# Patient Record
Sex: Male | Born: 1950
Health system: Southern US, Community
[De-identification: ages and names within clinical notes are randomized; demographics above are authoritative.]

## PROBLEM LIST (undated history)

## (undated) DIAGNOSIS — R7303 Prediabetes: Secondary | ICD-10-CM

## (undated) DIAGNOSIS — I350 Nonrheumatic aortic (valve) stenosis: Secondary | ICD-10-CM

## (undated) DIAGNOSIS — I499 Cardiac arrhythmia, unspecified: Secondary | ICD-10-CM

## (undated) DIAGNOSIS — M5430 Sciatica, unspecified side: Secondary | ICD-10-CM

## (undated) DIAGNOSIS — M1990 Unspecified osteoarthritis, unspecified site: Secondary | ICD-10-CM

## (undated) DIAGNOSIS — E041 Nontoxic single thyroid nodule: Secondary | ICD-10-CM

## (undated) DIAGNOSIS — E785 Hyperlipidemia, unspecified: Secondary | ICD-10-CM

## (undated) DIAGNOSIS — R011 Cardiac murmur, unspecified: Secondary | ICD-10-CM

## (undated) DIAGNOSIS — E119 Type 2 diabetes mellitus without complications: Secondary | ICD-10-CM

## (undated) DIAGNOSIS — K219 Gastro-esophageal reflux disease without esophagitis: Secondary | ICD-10-CM

## (undated) DIAGNOSIS — Z8719 Personal history of other diseases of the digestive system: Secondary | ICD-10-CM

## (undated) DIAGNOSIS — I2109 ST elevation (STEMI) myocardial infarction involving other coronary artery of anterior wall: Secondary | ICD-10-CM

## (undated) DIAGNOSIS — F419 Anxiety disorder, unspecified: Secondary | ICD-10-CM

## (undated) DIAGNOSIS — I2510 Atherosclerotic heart disease of native coronary artery without angina pectoris: Secondary | ICD-10-CM

## (undated) HISTORY — DX: Hyperlipidemia, unspecified: E78.5

## (undated) HISTORY — PX: CARDIAC CATHETERIZATION: SHX172

## (undated) HISTORY — PX: VASECTOMY: SHX75

## (undated) HISTORY — DX: ST elevation (STEMI) myocardial infarction involving other coronary artery of anterior wall: I21.09

## (undated) HISTORY — PX: BIOPSY THYROID: PRO38

## (undated) HISTORY — DX: Anxiety disorder, unspecified: F41.9

## (undated) HISTORY — DX: Atherosclerotic heart disease of native coronary artery without angina pectoris: I25.10

## (undated) HISTORY — PX: MANDIBLE OSTEOTOMY: SHX1007

---

## 2004-11-26 ENCOUNTER — Ambulatory Visit: Payer: Self-pay | Admitting: Internal Medicine

## 2004-11-26 ENCOUNTER — Ambulatory Visit (HOSPITAL_COMMUNITY): Admission: RE | Admit: 2004-11-26 | Discharge: 2004-11-26 | Payer: Self-pay | Admitting: Internal Medicine

## 2005-06-13 ENCOUNTER — Encounter: Payer: Self-pay | Admitting: Cardiology

## 2012-03-17 DIAGNOSIS — I499 Cardiac arrhythmia, unspecified: Secondary | ICD-10-CM

## 2012-03-17 HISTORY — PX: CORONARY ANGIOPLASTY WITH STENT PLACEMENT: SHX49

## 2012-03-17 HISTORY — DX: Cardiac arrhythmia, unspecified: I49.9

## 2012-04-22 ENCOUNTER — Inpatient Hospital Stay (HOSPITAL_COMMUNITY)
Admission: EM | Admit: 2012-04-22 | Discharge: 2012-04-26 | DRG: 247 | Disposition: A | Discharge status: Home / Self Care | Payer: 59 | Attending: Cardiology | Admitting: Cardiology

## 2012-04-22 ENCOUNTER — Encounter (HOSPITAL_COMMUNITY)
Admission: EM | Disposition: A | Discharge status: Home / Self Care | Payer: Self-pay | Source: Home / Self Care | Attending: Cardiology

## 2012-04-22 ENCOUNTER — Encounter (HOSPITAL_COMMUNITY): Payer: Self-pay | Admitting: Physician Assistant

## 2012-04-22 ENCOUNTER — Inpatient Hospital Stay (HOSPITAL_COMMUNITY): Payer: 59

## 2012-04-22 DIAGNOSIS — I10 Essential (primary) hypertension: Secondary | ICD-10-CM | POA: Diagnosis present

## 2012-04-22 DIAGNOSIS — I2510 Atherosclerotic heart disease of native coronary artery without angina pectoris: Secondary | ICD-10-CM

## 2012-04-22 DIAGNOSIS — E876 Hypokalemia: Secondary | ICD-10-CM | POA: Diagnosis not present

## 2012-04-22 DIAGNOSIS — I2109 ST elevation (STEMI) myocardial infarction involving other coronary artery of anterior wall: Secondary | ICD-10-CM

## 2012-04-22 DIAGNOSIS — K219 Gastro-esophageal reflux disease without esophagitis: Secondary | ICD-10-CM | POA: Diagnosis present

## 2012-04-22 DIAGNOSIS — R7303 Prediabetes: Secondary | ICD-10-CM | POA: Diagnosis present

## 2012-04-22 DIAGNOSIS — R7309 Other abnormal glucose: Secondary | ICD-10-CM | POA: Diagnosis present

## 2012-04-22 DIAGNOSIS — Z955 Presence of coronary angioplasty implant and graft: Secondary | ICD-10-CM

## 2012-04-22 HISTORY — PX: LEFT HEART CATHETERIZATION WITH CORONARY ANGIOGRAM: SHX5451

## 2012-04-22 HISTORY — DX: Prediabetes: R73.03

## 2012-04-22 HISTORY — DX: Gastro-esophageal reflux disease without esophagitis: K21.9

## 2012-04-22 HISTORY — DX: Nontoxic single thyroid nodule: E04.1

## 2012-04-22 LAB — POCT I-STAT, CHEM 8
BUN: 7 mg/dL (ref 6–23)
Creatinine, Ser: 0.7 mg/dL (ref 0.50–1.35)
Hemoglobin: 14.6 g/dL (ref 13.0–17.0)
Potassium: 2.5 mEq/L — CL (ref 3.5–5.1)
Sodium: 133 mEq/L — ABNORMAL LOW (ref 135–145)

## 2012-04-22 LAB — CBC
Hemoglobin: 14.1 g/dL (ref 13.0–17.0)
MCH: 32.3 pg (ref 26.0–34.0)
MCHC: 35.8 g/dL (ref 30.0–36.0)
Platelets: 171 10*3/uL (ref 150–400)
RBC: 4.37 MIL/uL (ref 4.22–5.81)

## 2012-04-22 LAB — COMPREHENSIVE METABOLIC PANEL
ALT: 75 U/L — ABNORMAL HIGH (ref 0–53)
AST: 51 U/L — ABNORMAL HIGH (ref 0–37)
Alkaline Phosphatase: 58 U/L (ref 39–117)
Calcium: 7.4 mg/dL — ABNORMAL LOW (ref 8.4–10.5)
Glucose, Bld: 182 mg/dL — ABNORMAL HIGH (ref 70–99)
Potassium: 2.4 mEq/L — CL (ref 3.5–5.1)
Sodium: 128 mEq/L — ABNORMAL LOW (ref 135–145)
Total Protein: 5.6 g/dL — ABNORMAL LOW (ref 6.0–8.3)

## 2012-04-22 LAB — LIPID PANEL
Cholesterol: 141 mg/dL (ref 0–200)
HDL: 53 mg/dL (ref >39)
LDL Cholesterol: 72 mg/dL (ref 0–99)
Total CHOL/HDL Ratio: 2.7 RATIO
Triglycerides: 82 mg/dL (ref <150)
VLDL: 16 mg/dL (ref 0–40)

## 2012-04-22 LAB — CK TOTAL AND CKMB (NOT AT ARMC): Total CK: 119 U/L (ref 7–232)

## 2012-04-22 LAB — TROPONIN I: Troponin I: 0.61 ng/mL (ref <0.30)

## 2012-04-22 SURGERY — LEFT HEART CATHETERIZATION WITH CORONARY ANGIOGRAM
Anesthesia: LOCAL

## 2012-04-22 MED ORDER — ATROPINE SULFATE 1 MG/ML IJ SOLN
INTRAMUSCULAR | Status: AC
  Filled 2012-04-22: qty 1

## 2012-04-22 MED ORDER — ASPIRIN 81 MG PO CHEW
81.0000 mg | CHEWABLE_TABLET | Freq: Every day | ORAL | Status: DC
  Administered 2012-04-23 – 2012-04-26 (×4): 81 mg via ORAL
  Filled 2012-04-22 (×4): qty 1

## 2012-04-22 MED ORDER — LIDOCAINE HCL (PF) 1 % IJ SOLN
INTRAMUSCULAR | Status: AC
  Filled 2012-04-22: qty 30

## 2012-04-22 MED ORDER — ATORVASTATIN CALCIUM 80 MG PO TABS: 80.0000 mg | ORAL_TABLET | Freq: Every day | ORAL | Status: DC

## 2012-04-22 MED ORDER — ONDANSETRON HCL 4 MG/2ML IJ SOLN
4.0000 mg | Freq: Four times a day (QID) | INTRAMUSCULAR | Status: DC | PRN
  Administered 2012-04-22: 4 mg via INTRAVENOUS
  Filled 2012-04-22: qty 2

## 2012-04-22 MED ORDER — ATORVASTATIN CALCIUM 80 MG PO TABS
80.0000 mg | ORAL_TABLET | Freq: Every day | ORAL | Status: DC
  Administered 2012-04-22 – 2012-04-25 (×3): 80 mg via ORAL
  Filled 2012-04-22 (×5): qty 1

## 2012-04-22 MED ORDER — FENTANYL CITRATE 0.05 MG/ML IJ SOLN
INTRAMUSCULAR | Status: AC
  Administered 2012-04-23: 12.5 ug via INTRAVENOUS
  Filled 2012-04-22: qty 2

## 2012-04-22 MED ORDER — ACETAMINOPHEN 325 MG PO TABS
650.0000 mg | ORAL_TABLET | ORAL | Status: DC | PRN
  Administered 2012-04-23: 650 mg via ORAL
  Filled 2012-04-22: qty 2

## 2012-04-22 MED ORDER — SODIUM CHLORIDE 0.9 % IJ SOLN: 3.0000 mL | INTRAMUSCULAR | Status: DC | PRN

## 2012-04-22 MED ORDER — TICAGRELOR 90 MG PO TABS
90.0000 mg | ORAL_TABLET | Freq: Two times a day (BID) | ORAL | Status: DC
  Administered 2012-04-22 – 2012-04-26 (×8): 90 mg via ORAL
  Filled 2012-04-22 (×9): qty 1

## 2012-04-22 MED ORDER — MORPHINE SULFATE 2 MG/ML IJ SOLN
INTRAMUSCULAR | Status: AC
  Administered 2012-04-22: 2 mg via INTRAVENOUS
  Filled 2012-04-22: qty 1

## 2012-04-22 MED ORDER — MORPHINE SULFATE 2 MG/ML IJ SOLN
2.0000 mg | INTRAMUSCULAR | Status: DC | PRN
  Administered 2012-04-22: 2 mg via INTRAVENOUS
  Filled 2012-04-22: qty 1

## 2012-04-22 MED ORDER — SODIUM CHLORIDE 0.9 % IV SOLN
0.2500 mg/kg/h | INTRAVENOUS | Status: AC
  Filled 2012-04-22: qty 250

## 2012-04-22 MED ORDER — ENOXAPARIN SODIUM 40 MG/0.4ML ~~LOC~~ SOLN: 40.0000 mg | SUBCUTANEOUS | Status: DC

## 2012-04-22 MED ORDER — TICAGRELOR 90 MG PO TABS
ORAL_TABLET | ORAL | Status: AC
  Administered 2012-04-23: 90 mg via ORAL
  Filled 2012-04-22: qty 2

## 2012-04-22 MED ORDER — HEPARIN SODIUM (PORCINE) 5000 UNIT/ML IJ SOLN
5000.0000 [IU] | Freq: Three times a day (TID) | INTRAMUSCULAR | Status: DC
  Administered 2012-04-23 – 2012-04-26 (×9): 5000 [IU] via SUBCUTANEOUS
  Filled 2012-04-22 (×13): qty 1

## 2012-04-22 MED ORDER — PROMETHAZINE HCL 25 MG/ML IJ SOLN
12.5000 mg | INTRAMUSCULAR | Status: DC | PRN
  Filled 2012-04-22 (×2): qty 1

## 2012-04-22 MED ORDER — SODIUM CHLORIDE 0.9 % IV SOLN
1.0000 mL/kg/h | INTRAVENOUS | Status: AC
  Administered 2012-04-22: 1 mL/kg/h via INTRAVENOUS

## 2012-04-22 MED ORDER — POTASSIUM CHLORIDE 10 MEQ/100ML IV SOLN
10.0000 meq | INTRAVENOUS | Status: AC
  Administered 2012-04-22 (×4): 10 meq via INTRAVENOUS
  Filled 2012-04-22 (×4): qty 100

## 2012-04-22 MED ORDER — POTASSIUM CHLORIDE 10 MEQ/100ML IV SOLN
INTRAVENOUS | Status: AC
  Filled 2012-04-22: qty 100

## 2012-04-22 MED ORDER — BIVALIRUDIN 250 MG IV SOLR
INTRAVENOUS | Status: AC
  Filled 2012-04-22: qty 250

## 2012-04-22 MED ORDER — LORAZEPAM 0.5 MG PO TABS
0.5000 mg | ORAL_TABLET | Freq: Four times a day (QID) | ORAL | Status: DC | PRN
  Administered 2012-04-23: 0.5 mg via ORAL
  Filled 2012-04-22: qty 1

## 2012-04-22 MED ORDER — NITROGLYCERIN IN D5W 200-5 MCG/ML-% IV SOLN: 2.0000 ug/min | INTRAVENOUS | Status: DC

## 2012-04-22 MED ORDER — MIDAZOLAM HCL 2 MG/2ML IJ SOLN
INTRAMUSCULAR | Status: AC
  Filled 2012-04-22: qty 2

## 2012-04-22 MED ORDER — ALUM & MAG HYDROXIDE-SIMETH 200-200-20 MG/5ML PO SUSP
30.0000 mL | Freq: Four times a day (QID) | ORAL | Status: DC | PRN
  Administered 2012-04-22 – 2012-04-24 (×2): 30 mL via ORAL
  Filled 2012-04-22 (×2): qty 30

## 2012-04-22 MED ORDER — ZOLPIDEM TARTRATE 5 MG PO TABS: 5.0000 mg | ORAL_TABLET | Freq: Every evening | ORAL | Status: DC | PRN

## 2012-04-22 MED ORDER — HEPARIN (PORCINE) IN NACL 2-0.9 UNIT/ML-% IJ SOLN
INTRAMUSCULAR | Status: AC
  Filled 2012-04-22: qty 1000

## 2012-04-22 MED ORDER — MAGNESIUM SULFATE 40 MG/ML IJ SOLN
2.0000 g | Freq: Once | INTRAMUSCULAR | Status: AC
  Administered 2012-04-22: 2 g via INTRAVENOUS
  Filled 2012-04-22: qty 50

## 2012-04-22 MED ORDER — MORPHINE SULFATE 10 MG/ML IJ SOLN
INTRAMUSCULAR | Status: AC
  Filled 2012-04-22: qty 0.2

## 2012-04-22 MED ORDER — PROMETHAZINE HCL 25 MG/ML IJ SOLN
INTRAMUSCULAR | Status: AC
  Filled 2012-04-22: qty 1

## 2012-04-22 MED ORDER — NITROGLYCERIN IN D5W 200-5 MCG/ML-% IV SOLN
INTRAVENOUS | Status: AC
  Filled 2012-04-22: qty 250

## 2012-04-22 MED ORDER — ASPIRIN 81 MG PO CHEW
CHEWABLE_TABLET | ORAL | Status: AC
  Administered 2012-04-23: 81 mg via ORAL
  Filled 2012-04-22: qty 4

## 2012-04-22 MED ORDER — ALPRAZOLAM 0.25 MG PO TABS: 0.2500 mg | ORAL_TABLET | Freq: Two times a day (BID) | ORAL | Status: DC | PRN

## 2012-04-22 MED ORDER — SODIUM CHLORIDE 0.9 % IV SOLN
250.0000 mL | INTRAVENOUS | Status: DC | PRN
  Administered 2012-04-22: 250 mL via INTRAVENOUS

## 2012-04-22 MED ORDER — SODIUM CHLORIDE 0.9 % IJ SOLN
3.0000 mL | Freq: Two times a day (BID) | INTRAMUSCULAR | Status: DC
  Administered 2012-04-22 – 2012-04-26 (×7): 3 mL via INTRAVENOUS

## 2012-04-22 MED ORDER — NITROGLYCERIN 0.4 MG SL SUBL
0.4000 mg | SUBLINGUAL_TABLET | SUBLINGUAL | Status: DC | PRN
  Administered 2012-04-24: 0.4 mg via SUBLINGUAL
  Filled 2012-04-22: qty 25

## 2012-04-22 MED ORDER — ONDANSETRON HCL 4 MG/2ML IJ SOLN
INTRAMUSCULAR | Status: AC
  Filled 2012-04-22: qty 2

## 2012-04-22 NOTE — CV Procedure (Signed)
   Cardiac Catheterization Procedure Note  Name: Patrick Gordon MRN: [id number] DOB: Mar 14, 1951  Procedure: Left Heart Cath, Selective Coronary Angiography, LV angiography  Indication: Anterior STEMI.  Patient's pain began around 4 pm.  He collapsed in his office and was shocked by AED with ROSC after brief CPR.  He is in pain currently in the cath lab.   Procedural details: The right groin was prepped, draped, and anesthetized with 1% lidocaine. Using modified Seldinger technique, a 5 French sheath was introduced into the right femoral artery. Standard Judkins catheters were used for coronary angiography and left ventriculography. Catheter exchanges were performed over a guidewire. There were no immediate procedural complications. The patient will undergo LAD PCI.  Procedural Findings: Hemodynamics:  AO 129/22 LV 103/69   Coronary angiography: Coronary dominance: right  Left mainstem: 30% distal stenosis.   Left anterior descending (LAD): The proximal LAD was totally occluded with thrombus.   Left circumflex (LCx): 40-50% stenosis in the proximal OM1.   Right coronary artery (RCA): 60-70% proximal stenosis.   Left ventriculography: The mid to apical anterior akinesis, akinetic apex, mid to apical inferior akinesis.  Hyperkinesis of the basal segments.  EF 25-30%.  Final Conclusions:  Anterior MI with severe systolic dysfunction.    Recommendations: Proceed to PCI.   [name] 04/22/2012, 6:14 PM

## 2012-04-22 NOTE — H&P (Signed)
History and Physical   Patient ID: Patrick Gordon MRN: [id number], DOB/AGE: 1951/01/20 62 y.o. 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 He has been having chest pain described as a heaviness, off/on for a couple of days. Last pm, the symptoms were worse. Today, symptoms had not improved. He was having problems with SOB and using his inhaler. He went to lunch, and after he came back, he did an ECG on himself, which was abnormal. He gave himself Ventolin inhaler at 3:45 pm, Demerol 50 mg at 4 pm (presumably IM), and took SL NTG at 4:25 and 4:30 pm. He collapsed, witnessed by staff who started CPR. An AED advised 1 shock, given. He then regained consciousness. EMS arrived and did not give ASA due to N&V. He rec'd IV fentanyl 75 mcg and 25 mcg plus Zofran 4 mg IV and a 500 cc bolus of normal saline. By arrival at [facility], his pain was a 5/10. He was diaphoretic, SOB and nauseated. He vomited x 1. He was taken emergently to the cath lab.   Past Medical History  Diagnosis Date  . Borderline diabetes   . HTN (hypertension)      Surgical History: No past surgical history on file.   I have reviewed the patient's current medications. Prior to Admission medications   Not on File    Scheduled Meds:   Continuous Infusions:   PRN Meds:.    Allergies:  Allergies  Allergen Reactions  . Sulfa Antibiotics     History   Social History  . Marital Status: Married    Spouse Name: N/A    Number of Children: N/A  . Years of Education: N/A   Occupational History  . MD     [employer]   Social History Main Topics  . Smoking status: Not on file  . Smokeless tobacco: Not on file  . Alcohol Use: Not on file  . Drug Use: Not on file  . Sexually Active: Not on file   Other Topics Concern  . Not on file    Social History Narrative  . No other information on PMH, PSH, Meds, Family History or other information available due to the patient's condition.    Review of Systems:   Not available due to patient condition.   Physical Exam: Height 5\' 8"  (1.727 m), weight 200 lb (90.719 kg). General: Well developed, well nourished, male in acute distress. Head: Normocephalic, atraumatic, sclera non-icteric, no xanthomas, nares are without discharge. Dentition: good  Neck: No carotid bruits. JVD not elevated. No thyromegally Lungs: Good expansion bilaterally. without wheezes or rhonchi.  Heart: Regular rate and rhythm with S1 S2.  No S3 or S4.  No murmur, no rubs, or gallops appreciated. Abdomen: Soft, non-tender, non-distended with normoactive bowel sounds. No hepatomegaly. No rebound/guarding. No obvious abdominal masses. Msk:  Strength and tone appear normal for age. No joint deformities or effusions, no spine or costo-vertebral angle tenderness. Extremities: No clubbing or cyanosis. No edema.  Distal pedal pulses are 2+ in 4 extrem Neuro: Alert and oriented X 3. Moves all extremities spontaneously. No focal deficits noted. Psych:  Responds to questions appropriately with a normal affect. Skin: No rashes or lesions noted  Labs: pending   Radiology/Studies:  pending   ECG:  SR with anterior ST elevation  ASSESSMENT AND PLAN:  Principal  Problem:  *ST elevation myocardial infarction (STEMI) of anterior wall, initial episode of care - emergent catheterization with further evaluation and treatment depending on the results. He will be started on a statin and ASA. No BB at this time because of bradycardia, heart rates in the low 50s at times.  Otherwise, continue home meds once available and screen for cardiac risk factor control. Active Problems:  Borderline diabetes  HTN (hypertension)   Signed,  Theodore Demark PA-C 04/22/2012, 6:13 PM  Patient seen with PA, agree with the above note.   Patient has anterior MI with ongoing chest pain.  ECG with anterior ST elevation.  He had a presumed vfib arrest in his office and was revived after shock from AED.  He is conscious and responds appropriately.  Will proceed to cath/PCI.   [name] 04/22/2012 6:25 PM

## 2012-04-22 NOTE — CV Procedure (Signed)
   CARDIAC CATH NOTE  Name: Patrick Gordon MRN: [id number] DOB: 11/27/1950  Procedure: PTCA and stenting of the LAD  Indication: 62 year old male who presented with an acute anterior ST elevation myocardial infarction. He collapsed and received and AED discharge with return of spontaneous circulation. He did receive brief CPR. ECG showed ST elevation anterior leads up to 6 mm. Diagnostic angiogram showed occlusion of the proximal LAD with no collateral flow.  Procedural Details: We used the diagnostic 6 French sheath that was in place. Weight-based bivalirudin was given for anticoagulation. Brilinta 180 mg was given orally.Once a therapeutic ACT was achieved, a 6 [place] XB LAD 4 guide catheter was inserted.  A pro-water coronary guidewire was used to cross the lesion.  The lesion was predilated with a 2.5 mm balloon.  With reperfusion there was evidence of a long disease segment in the proximal to mid LAD spanning the first diagonal branch. The lesion was then stented with a 3.0 x 38 mm Promus stent in the distal segment.  We then stented the more proximal segment with a 3.5 x 20 mm Promus stent in an overlapping fashion. The stent was postdilated with a 3.25 mm noncompliant balloon in the more distal stent segment. The proximal segment was postdilated with a 3.75 mm noncompliant balloon. Following PCI, there was 0% residual stenosis and TIMI-3 flow. The first diagonal branch was not compromised. Final angiography confirmed an excellent result. The patient tolerated the procedure well. There were no immediate procedural complications. Femoral hemostasis was achieved with manual compression. The patient was transferred to the post catheterization recovery area for further monitoring.  Lesion Data: Vessel: Proximal LAD Percent stenosis (pre): 100% TIMI-flow (pre):  0 Stent:  3.5 x 20 mm Promus stent, 3.0 x 38 mm Promus stent Percent stenosis (post): 0% TIMI-flow (post): 3  Conclusions:  Successful stenting of the proximal LAD with 2 drug-eluting stents.  Recommendations: Patient be transferred to the intensive care unit. We will continue Angiomax at reduced dose for the next hour. We'll continue dual antiplatelet therapy for at least one year. We'll replete his potassium. We'll start beta blocker and ACE inhibitor therapy as blood pressure allows.  [name] 04/22/2012, 6:43 PM

## 2012-04-23 ENCOUNTER — Encounter (HOSPITAL_COMMUNITY): Payer: Self-pay | Admitting: *Deleted

## 2012-04-23 DIAGNOSIS — I517 Cardiomegaly: Secondary | ICD-10-CM

## 2012-04-23 DIAGNOSIS — I2109 ST elevation (STEMI) myocardial infarction involving other coronary artery of anterior wall: Principal | ICD-10-CM

## 2012-04-23 LAB — BASIC METABOLIC PANEL
BUN: 8 mg/dL (ref 6–23)
CO2: 25 mEq/L (ref 19–32)
Calcium: 8.4 mg/dL (ref 8.4–10.5)
Chloride: 98 mEq/L (ref 96–112)
Creatinine, Ser: 0.68 mg/dL (ref 0.50–1.35)
Glucose, Bld: 161 mg/dL — ABNORMAL HIGH (ref 70–99)

## 2012-04-23 LAB — COMPREHENSIVE METABOLIC PANEL
ALT: 110 U/L — ABNORMAL HIGH (ref 0–53)
AST: 324 U/L — ABNORMAL HIGH (ref 0–37)
Alkaline Phosphatase: 61 U/L (ref 39–117)
CO2: 25 mEq/L (ref 19–32)
Calcium: 8.4 mg/dL (ref 8.4–10.5)
GFR calc Af Amer: >90 mL/min (ref >90)
Glucose, Bld: 164 mg/dL — ABNORMAL HIGH (ref 70–99)
Potassium: 4.2 mEq/L (ref 3.5–5.1)
Sodium: 135 mEq/L (ref 135–145)
Total Protein: 6.2 g/dL (ref 6.0–8.3)

## 2012-04-23 LAB — POCT I-STAT, CHEM 8
BUN: 7 mg/dL (ref 6–23)
Calcium, Ion: 1.09 mmol/L — ABNORMAL LOW (ref 1.13–1.30)
Chloride: 100 mEq/L (ref 96–112)
Creatinine, Ser: 0.8 mg/dL (ref 0.50–1.35)
Glucose, Bld: 185 mg/dL — ABNORMAL HIGH (ref 70–99)

## 2012-04-23 LAB — CBC
HCT: 40.3 % (ref 39.0–52.0)
MCH: 32.4 pg (ref 26.0–34.0)
MCV: 91.4 fL (ref 78.0–100.0)
Platelets: 169 10*3/uL (ref 150–400)
RBC: 4.41 MIL/uL (ref 4.22–5.81)
WBC: 9.1 10*3/uL (ref 4.0–10.5)

## 2012-04-23 LAB — POCT ACTIVATED CLOTTING TIME: Activated Clotting Time: 410 seconds

## 2012-04-23 MED ORDER — PANTOPRAZOLE SODIUM 40 MG IV SOLR
40.0000 mg | Freq: Once | INTRAVENOUS | Status: AC
  Administered 2012-04-23: 40 mg via INTRAVENOUS
  Filled 2012-04-23: qty 40

## 2012-04-23 MED ORDER — FENTANYL CITRATE 0.05 MG/ML IJ SOLN
12.5000 ug | INTRAMUSCULAR | Status: DC | PRN
  Administered 2012-04-23 (×3): 12.5 ug via INTRAVENOUS
  Filled 2012-04-23 (×3): qty 2

## 2012-04-23 MED ORDER — CARVEDILOL 3.125 MG PO TABS
3.1250 mg | ORAL_TABLET | Freq: Two times a day (BID) | ORAL | Status: DC
  Administered 2012-04-23 – 2012-04-26 (×6): 3.125 mg via ORAL
  Filled 2012-04-23 (×9): qty 1

## 2012-04-23 MED FILL — Dextrose Inj 5%: INTRAVENOUS | Qty: 50 | Status: AC

## 2012-04-23 NOTE — Progress Notes (Signed)
[id number] Began MI ed. Discussed MI restrictions,stent and NTG use with pt and wife. Discussed CRP 2 and permission given to refer to [facility] Phase 2. Will follow up tomorrow. Hollan Philipp DunlapRN

## 2012-04-23 NOTE — Care Management Note (Addendum)
    Page 1 of 1   04/26/2012     10:47:20 AM   CARE MANAGEMENT NOTE 04/26/2012  Patient:  Patrick Gordon, Patrick Gordon   Account Number:  [id number]  Date Initiated:  04/23/2012  Documentation initiated by:  Junius Creamer  Subjective/Objective Assessment:   adm w mi     Action/Plan:   lives w fam -is phys in [place], has [facility]   Anticipated DC Date:     Anticipated DC Plan:        DC Planning Services  CM consult      Choice offered to / List presented to:             Status of service:  Completed, signed off Medicare Important Message given?   (If response is "NO", the following Medicare IM given date fields will be blank) Date Medicare IM given:   Date Additional Medicare IM given:    Discharge Disposition:  HOME/SELF CARE  Per UR Regulation:  Reviewed for med. necessity/level of care/duration of stay  If discussed at Long Length of Stay Meetings, dates discussed:    Comments:  2-10-114 Tomi Bamberger, RN,BSN [phone] CM did call [facility] and they do not have medicaiton in stock- Brilinta. RN CM did call to the [facility] outpatient pharmacy and medication is avialable. Pt to take 30 day free Rx to [facility] outpatient to get filled. No further needs form CM at this time.   2/7 1045a debbie dowell rn,bsn gave pt brilinta 30day free card adn copay assist card. will have 25.00 for 30day copay if uses [facility].

## 2012-04-23 NOTE — Progress Notes (Signed)
Removed sheath from right groin. Prior to removal R groin level 0. Manual pressure applied for 20 mins. Site still oozing. Additional pressure held for . Pt tolerated procedure well. Post sheath removal site level zero. Pulses present throughout sheath pull and post sheath removal. Instructions given. Pressure dressing applied. Will continue to monitor pt.  Perkins, [place] Elizabeth

## 2012-04-23 NOTE — Progress Notes (Signed)
Pt allowed to rest uninterrupted until this time, with instructions to call RN if any CP persisted.  As of this note, pt states that he "feels much better after the rest"  CP has resolved.    Eliane Decree, RN

## 2012-04-23 NOTE — Progress Notes (Signed)
  Echocardiogram 2D Echocardiogram has been performed.  Patrick Gordon 04/23/2012, 9:56 AM

## 2012-04-23 NOTE — Progress Notes (Signed)
Pt having 2/10 left chest pressure.  Have been weaning off nitro gtt, but titrated back up to .  Spoke with Dr Daleen Squibb.  Orders to give coreg, 12.5 fentanyl and check EKG.  Per MD if no worsening of EKG from previous ones may go ahead and d/c nitro and monitor pt after admin of above meds.  Will notify MD if necessary.    Eliane Decree, RN

## 2012-04-23 NOTE — Progress Notes (Signed)
Chaplain reported to EMS Code [facility] page.  The patient was taken directly to the Cath Lab. Provided pastoral support to friends of patient who came with patient to hospital. Assisted staff  with trying to contact family who report to be out of town. Offered comfort measures to friends and will follow-up at a later time for up-date of patient status.  Patient is admitted to [address]. On-Call Chaplain Janell Quiet [phone]

## 2012-04-23 NOTE — Progress Notes (Signed)
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  He did vomit overnight, and reports that he is intolerant to morphine.  He requests lunesta or another sleeping aid be written for tonight.  CURRENT MEDS    . aspirin  81 mg Oral Daily  . atorvastatin  80 mg Oral q1800  . heparin  5,000 Units Subcutaneous Q8H  . sodium chloride  3 mL Intravenous Q12H  . Ticagrelor  90 mg Oral BID    OBJECTIVE  Filed Vitals:   04/23/12 0200 04/23/12 0300 04/23/12 0400 04/23/12 0500  BP: 92/64 101/67 107/72 105/64  Pulse: 89 87 93 90  Temp:   98.3 F (36.8 C)   TempSrc:   Oral   Resp: 16 16 14 16   Height:      Weight:    207 lb 14.3 oz (94.3 kg)  SpO2: 96% 97% 97% 97%    Intake/Output Summary (Last 24 hours) at 04/23/12 0629 Last data filed at 04/23/12 0500  Gross per 24 hour  Intake 870.23 ml  Output    800 ml  Net  70.23 ml   Filed Weights   04/22/12 1700 04/22/12 2100 04/23/12 0500  Weight: 200 lb (90.719 kg) 204 lb 2.3 oz (92.6 kg) 207 lb 14.3 oz (94.3 kg)    PHYSICAL EXAM  General: Pleasant, NAD. Neuro: Alert and oriented X 3. Moves all extremities spontaneously. Psych: Normal affect. HEENT:  Normal  Neck: Supple without bruits or JVD. Lungs:  Resp regular and unlabored, CTA. Heart: RRR no s3, s4, or murmurs. Abdomen: Soft, non-tender, non-distended, BS + x 4.  Extremities: No clubbing, cyanosis or edema. DP/PT/Radials 2+ and equal bilaterally.  Accessory Clinical Findings  CBC  Basename 04/23/12 0545 04/22/12 1811 04/22/12 1800  WBC 9.1 -- 12.7*  NEUTROABS -- -- --  HGB 14.3 14.6 --  HCT 40.3 43.0 --  MCV 91.4 -- 90.2  PLT 169 -- 171   Basic Metabolic Panel  Basename 04/23/12 0002 04/22/12 1811 04/22/12 1800  NA  136 133* --  K 4.5 2.5* --  CL 98 96 --  CO2 25 -- 18*  GLUCOSE 161* 170* --  BUN 8 7 --  CREATININE 0.68 0.70 --  CALCIUM 8.4 -- 7.4*  MG -- -- 1.4*  PHOS -- -- --   Liver Function Tests  Basename 04/22/12 1800  AST 51*  ALT 75*  ALKPHOS 58  BILITOT 0.4  PROT 5.6*  ALBUMIN 3.1*   No results found for this basename: LIPASE:2,AMYLASE:2 in the last 72 hours Cardiac Enzymes  Basename 04/23/12 0002 04/22/12 1800  CKTOTAL -- 119  CKMB -- 6.0*  CKMBINDEX -- --  TROPONINI >20.00* 0.61*   BNP No components found with this basename: POCBNP:3 D-Dimer No results found for this basename: DDIMER:2 in the last 72 hours Hemoglobin A1C  Basename 04/22/12 1800  HGBA1C 6.1*   Fasting Lipid Panel  Basename 04/22/12 1800  CHOL 141  HDL 53  LDLCALC 72  TRIG 82  CHOLHDL 2.7  LDLDIRECT --   Thyroid Function Tests No results found for this basename: TSH,T4TOTAL,FREET3,T3FREE,THYROIDAB in the last 72 hours  TELE - no significant events  ECG - no change  Radiology/Studies  Portable Chest X-ray 1 View 04/22/2012  IMPRESSION: Mild pulmonary venous  congestion without frank evidence of edema.     ASSESSMENT AND PLAN 62 yo M with no significant PMH except pre-diabetes who presented with an acute anterior STEMI on 04/22/12.    #STEMI: Day 1 s/p PTCA and stenting of the LAD (2 DES, 100% stenosis of proximal LAD; also revealed 60-70% stenosis of RCA & 40-50% stenosis of LCx in proximal OM1) -Dual antiplatelet therapy x 1 year (ASA 81 & Brilinta 90) -start low dose beta blocker today (metoprolol, cardioselective), and add low dose ACEI tomorrow if BP can tolerate -Atorvastatin 80mg  qHS (LDL goal<100, LDL = 72) -Pain mgmt with prn fentanyl -Regarding risk stratification, A1c = 6.1, so lifestyle modification at this time.  #Acute systolic CHF: EF 16-10% during catheterization, CXR revealed mild pulmonary venous congestion -ACEI to be started when BP can tolerate -BP cannot tolerate  diruesis and patient does not appear clinically fluid overloaded at this time -f/u 2D echo  #Dispo: transfer to tele  Signed, Stacy Gardner MD, PGY-2 [phone] Patient examined and agree except changes made.Carvedilol 3.125mg  po bid. Discontinue IV NTG. Will need ACEI before discharge.  Valera Castle, MD 04/23/2012 9:42 AM

## 2012-04-24 DIAGNOSIS — I2109 ST elevation (STEMI) myocardial infarction involving other coronary artery of anterior wall: Secondary | ICD-10-CM

## 2012-04-24 LAB — BASIC METABOLIC PANEL
CO2: 26 mEq/L (ref 19–32)
Chloride: 101 mEq/L (ref 96–112)
Creatinine, Ser: 0.75 mg/dL (ref 0.50–1.35)
Glucose, Bld: 147 mg/dL — ABNORMAL HIGH (ref 70–99)

## 2012-04-24 MED ORDER — DIGOXIN 250 MCG PO TABS
0.2500 mg | ORAL_TABLET | Freq: Every day | ORAL | Status: DC
  Administered 2012-04-24 – 2012-04-26 (×3): 0.25 mg via ORAL
  Filled 2012-04-24 (×3): qty 1

## 2012-04-24 MED ORDER — MAGNESIUM SULFATE 40 MG/ML IJ SOLN
2.0000 g | Freq: Once | INTRAMUSCULAR | Status: AC
  Administered 2012-04-24: 2 g via INTRAVENOUS
  Filled 2012-04-24: qty 50

## 2012-04-24 MED ORDER — ENALAPRIL MALEATE 2.5 MG PO TABS
2.5000 mg | ORAL_TABLET | Freq: Two times a day (BID) | ORAL | Status: DC
  Administered 2012-04-24 – 2012-04-26 (×5): 2.5 mg via ORAL
  Filled 2012-04-24 (×7): qty 1

## 2012-04-24 NOTE — Progress Notes (Signed)
Patient Name: Patrick Gordon Date of Encounter: 04/24/2012     Principal Problem:   ST elevation myocardial infarction (STEMI) of anterior wall, initial episode of care Active Problems:   Borderline diabetes    SUBJECTIVE No significant events overnight, denies chest pressure currently, requests shower. SBP soft 86-110. Tolerating carvedilol  CURRENT MEDS . aspirin  81 mg Oral Daily  . atorvastatin  80 mg Oral q1800  . carvedilol  3.125 mg Oral BID WC  . heparin  5,000 Units Subcutaneous Q8H  . sodium chloride  3 mL Intravenous Q12H  . Ticagrelor  90 mg Oral BID    OBJECTIVE  Filed Vitals:   04/24/12 0300 04/24/12 0400 04/24/12 0500 04/24/12 0600  BP: 108/62 99/67 86/57  105/56  Pulse:      Temp:  98.6 F (37 C)    TempSrc:  Oral    Resp:      Height:      Weight:   196 lb 10.4 oz (89.2 kg)   SpO2:  99%      Intake/Output Summary (Last 24 hours) at 04/24/12 0742 Last data filed at 04/23/12 2300  Gross per 24 hour  Intake 1060.25 ml  Output    600 ml  Net 460.25 ml  +596 since admission  Filed Weights   04/22/12 2100 04/23/12 0500 04/24/12 0500  Weight: 204 lb 2.3 oz (92.6 kg) 207 lb 14.3 oz (94.3 kg) 196 lb 10.4 oz (89.2 kg)    PHYSICAL EXAM  General: Pleasant, NAD. Neuro: Alert and oriented X 3. Moves all extremities spontaneously. Psych: Normal affect. HEENT:  Normal  Neck: Supple without bruits or JVD. Lungs:  Resp regular and unlabored, CTA. Heart: RRR no s3, s4, or murmurs. Abdomen: Soft, non-tender, non-distended, BS + x 4.  Extremities: No clubbing, cyanosis or edema. DP/PT/Radials 2+ and equal bilaterally.  Accessory Clinical Findings  CBC  Recent Labs  04/22/12 1800 04/22/12 1811 04/23/12 0545  WBC 12.7*  --  9.1  HGB 14.1 14.6 14.3  HCT 39.4 43.0 40.3  MCV 90.2  --  91.4  PLT 171  --  169   Basic Metabolic Panel  Recent Labs  04/22/12 1759 04/22/12 1800  04/23/12 0545 04/24/12 0510  NA 139 128*  < > 135 135  K 2.6*  2.4*  < > 4.2 3.8  CL 100 93*  < > 100 101  CO2  --  18*  < > 25 26  GLUCOSE 185* 182*  < > 164* 147*  BUN 7 9  < > 8 10  CREATININE 0.80 0.65  < > 0.67 0.75  CALCIUM  --  7.4*  < > 8.4 8.8  MG  --  1.4*  --   --   --   < > = values in this interval not displayed. Liver Function Tests  Recent Labs  04/22/12 1800 04/23/12 0545  AST 51* 324*  ALT 75* 110*  ALKPHOS 58 61  BILITOT 0.4 1.0  PROT 5.6* 6.2  ALBUMIN 3.1* 3.3*   Cardiac Enzymes  Recent Labs  04/22/12 1800 04/23/12 0002 04/23/12 0550  CKTOTAL 119  --   --   CKMB 6.0*  --   --   TROPONINI 0.61* >20.00* >20.00*   Hemoglobin A1C  Recent Labs  04/22/12 1800  HGBA1C 6.1*   Fasting Lipid Panel  Recent Labs  04/22/12 1800  CHOL 141  HDL 53  LDLCALC 72  TRIG 82  CHOLHDL 2.7    TELE -  no significant events   ECG - EKG yesterday without significant changes   Radiology/Studies  Portable Chest X-ray 1 View 04/22/2012    IMPRESSION: Mild pulmonary venous congestion without frank evidence of edema.     ASSESSMENT AND PLAN 62 yo M with no significant PMH except pre-diabetes who presented with an acute anterior STEMI on 04/22/12.   #STEMI: Day 2 s/p PTCA and stenting of the LAD (2 DES, 100% stenosis of proximal LAD; also revealed 60-70% stenosis of RCA & 40-50% stenosis of LCx in proximal OM1)  -Dual antiplatelet therapy x 1 year (ASA 81 & Brilinta 90)  -Continue low dose beta blocker today (coreg), and add low dose ACEI at discharge if BP can tolerate  -Atorvastatin 80mg  qHS (LDL goal<100, LDL = 72)  -Pain mgmt with prn fentanyl  -Regarding risk stratification, A1c = 6.1, so lifestyle modification at this time.   #Acute systolic CHF: EF 16-10% during catheterization, CXR revealed mild pulmonary venous congestion; Echo on 04/23/12 revealed EF 35%; currently no fluid overload -ACEI to be started when BP can tolerate  -repeat echo in 4-6 weeks  #Elevated LFTs: Likely secondary to cardiac arrest, but patient  also started on statin at admission.    #Hypomagnesmia/hypokalemia - will replete as needed.  #Dispo: transfer to tele today   Signed, Stacy Gardner MD, PGY 2 [phone]  Patient seen and examined with Dr. Everardo Beals. We discussed all aspects of the encounter. I agree with the assessment and plan as stated above.   He has significant LV dysfunction but no evidence of HF. Still mildly tachycardic though. Will add low-dose enalapril as tolerated. Also add digoxin. Would like to add spironolactone prior to d/c if BP can tolerate. Continue Brillinta, ASA, statin. Transfer to floor. Supp electrolytes.   Cardiac rehab to see. D/c home in 24-48 hours.   Luticia Tadros,MD 10:13 AM

## 2012-04-24 NOTE — Progress Notes (Signed)
CARDIAC REHAB PHASE I   PRE:  Rate/Rhythm: 79SR  BP:  Supine:   Sitting: 102/73  Standing:    SaO2: 95%RA  MODE:  Ambulation: 700 ft   POST:  Rate/Rhythem: 101ST  BP:  Supine:   Sitting: *Machine not working  Standing:    SaO2: 95%RA 1305-1350 Pt ambulated very well with minimal assistance. Pt denies SOB or CP during rest or ambulation. Educated pt on diet and exercise. Returned pt to recliner with call light and phone within reach.   Deetta Perla

## 2012-04-25 DIAGNOSIS — Z9861 Coronary angioplasty status: Secondary | ICD-10-CM

## 2012-04-25 LAB — COMPREHENSIVE METABOLIC PANEL
ALT: 64 U/L — ABNORMAL HIGH (ref 0–53)
Alkaline Phosphatase: 57 U/L (ref 39–117)
CO2: 27 mEq/L (ref 19–32)
Calcium: 9.1 mg/dL (ref 8.4–10.5)
GFR calc Af Amer: >90 mL/min (ref >90)
GFR calc non Af Amer: >90 mL/min (ref >90)
Glucose, Bld: 136 mg/dL — ABNORMAL HIGH (ref 70–99)
Potassium: 3.9 mEq/L (ref 3.5–5.1)
Sodium: 135 mEq/L (ref 135–145)

## 2012-04-25 LAB — CBC
MCH: 32.6 pg (ref 26.0–34.0)
MCHC: 35.6 g/dL (ref 30.0–36.0)
Platelets: 166 10*3/uL (ref 150–400)

## 2012-04-25 NOTE — Progress Notes (Signed)
Patient ID: Patrick Gordon, male   DOB: [birth year], 63 y.o.   MRN: [id number] Subjective:  Denies anginal symptoms.   Objective:  Vital Signs in the last 24 hours: Temp:  [97.7 F (36.5 C)-99.4 F (37.4 C)] 97.7 F (36.5 C) (02/09 0500) Pulse Rate:  [79-95] 79 (02/09 0500) Resp:  [16-18] 16 (02/09 0500) BP: (95-121)/(58-79) 105/68 mmHg (02/09 0500) SpO2:  [92 %-97 %] 92 % (02/09 0500) Weight:  [197 lb 9.6 oz (89.631 kg)] 197 lb 9.6 oz (89.631 kg) (02/09 0500)  Intake/Output from previous day: 02/08 0701 - 02/09 0700 In: 550 [P.O.:500; IV Piggyback:50] Out: -  Intake/Output from this shift:    Physical Exam: Well appearing middle aged man, NAD HEENT: Unremarkable Neck:  No JVD, no thyromegally Lungs:  Clear with no wheezes HEART:  Regular rate rhythm, no murmurs, no rubs, no clicks Abd:  soft, positive bowel sounds, no organomegally, no rebound, no guarding Ext:  2 plus pulses, no edema, no cyanosis, no clubbing Skin:  No rashes no nodules Neuro:  CN II through XII intact, motor grossly intact  Lab Results:  Recent Labs  04/23/12 0545 04/25/12 0550  WBC 9.1 7.0  HGB 14.3 15.4  PLT 169 166    Recent Labs  04/24/12 0510 04/25/12 0550  NA 135 135  K 3.8 3.9  CL 101 99  CO2 26 27  GLUCOSE 147* 136*  BUN 10 16  CREATININE 0.75 0.85    Recent Labs  04/23/12 0002 04/23/12 0550  TROPONINI >20.00* >20.00*   Hepatic Function Panel  Recent Labs  04/25/12 0550  PROT 6.8  ALBUMIN 3.2*  AST 70*  ALT 64*  ALKPHOS 57  BILITOT 1.0    Recent Labs  04/22/12 1800  CHOL 141   No results found for this basename: PROTIME,  in the last 72 hours  Imaging: No results found.  Cardiac Studies:  tele - nsr Assessment/Plan:  1. Acute MI 2. HTN 3. Dyslipidemia 4. LV dysfunction Rec: will advance activity, continue current meds and expect to discharge tomorrow. I do not feel strongly about the need for a life vest at this time. He will need repeat echo  in 3 months on maximal medical therapy.   LOS: 3 days    Gregg Taylor,M.D. 04/25/2012, 10:47 AM

## 2012-04-26 ENCOUNTER — Telehealth: Payer: Self-pay | Admitting: Cardiology

## 2012-04-26 DIAGNOSIS — K219 Gastro-esophageal reflux disease without esophagitis: Secondary | ICD-10-CM | POA: Diagnosis present

## 2012-04-26 DIAGNOSIS — R7309 Other abnormal glucose: Secondary | ICD-10-CM

## 2012-04-26 MED ORDER — TICAGRELOR 90 MG PO TABS: 90.0000 mg | ORAL_TABLET | Freq: Two times a day (BID) | ORAL | Status: DC

## 2012-04-26 MED ORDER — NITROGLYCERIN 0.4 MG SL SUBL: 0.4000 mg | SUBLINGUAL_TABLET | SUBLINGUAL | Status: DC | PRN

## 2012-04-26 MED ORDER — DIGOXIN 250 MCG PO TABS: 0.2500 mg | ORAL_TABLET | Freq: Every day | ORAL | Status: DC

## 2012-04-26 MED ORDER — ATORVASTATIN CALCIUM 80 MG PO TABS: 80.0000 mg | ORAL_TABLET | Freq: Every day | ORAL | Status: DC

## 2012-04-26 MED ORDER — CARVEDILOL 3.125 MG PO TABS: 3.1250 mg | ORAL_TABLET | Freq: Two times a day (BID) | ORAL | Status: DC

## 2012-04-26 MED ORDER — ENALAPRIL MALEATE 2.5 MG PO TABS: 2.5000 mg | ORAL_TABLET | Freq: Two times a day (BID) | ORAL | Status: DC

## 2012-04-26 NOTE — Discharge Summary (Signed)
CARDIOLOGY DISCHARGE SUMMARY   Patient ID: Patrick Gordon MRN: [id number] DOB/AGE: [birth year] 62 y.o.  Admit date: 04/22/2012 Discharge date: 04/26/2012  Primary Discharge Diagnosis:     ST elevation myocardial infarction (STEMI) of anterior wall, initial episode of care, S/P 3.5 x 20 mm Promus stent, 3.0 x 38 mm Promus stent to the LAD  Secondary Discharge Diagnosis:    Borderline diabetes   GERD  Procedures: Left Heart Cath, Selective Coronary Angiography, LV angiography, PTCA and stenting of the LAD   Hospital Course: Patrick Gordon is a 62 y.o. male with no history of CAD. He had chest heaviness and pressure episodes for several days prior to admission. The symptoms worsened and his ECG was abnormal. He collapsed, witnessed arrest and received CPR as well as one shock from an AED prior to EMS arrival. Upon EMS arrival, he had ST elevation in his anterior leads and was transported emergently to [facility] where he was taken directly to the cath lab.  Cardiac catheterization results are listed more fully below. He received 2 drug-eluting stents to the LAD with no other critical disease. His EF was decreased at cath but was slightly higher by echocardiogram. He was started on a low dose of a beta blocker and an ACE inhibitor. However, his blood pressure did not permit up-titration of these meds. A lipid profile is listed below and he was started on Lipitor 80 mg daily. This will be followed as an outpatient. A hemoglobin A1c was performed and was 6.1. He is encouraged to stick to a diabetic diet and followup with primary care.  He had some mild pulmonary vascular congestion on chest x-ray but no significant volume overload by exam so did not require diuresis. He is encouraged to weigh himself daily. Because of his left ventricular dysfunction he was started on digoxin as well as a beta blocker. No critical arrhythmias were seen during his hospital stay.  Dr [name] was seen by  cardiac rehabilitation and given information on MI restrictions, heart healthy lifestyle and outpatient cardiac rehabilitation. He will be referred to outpatient cardiac rehabilitation.  By 04/26/2012, Dr. Johny Chess had significantly improved. He was ambulating without chest pain or shortness of breath. He was seen by Dr. [place] and considered stable for discharge, to follow up as an outpatient.   Labs:   Lab Results  Component Value Date   WBC 7.0 04/25/2012   HGB 15.4 04/25/2012   HCT 43.3 04/25/2012   MCV 91.5 04/25/2012   PLT 166 04/25/2012     Recent Labs Lab 04/25/12 0550  NA 135  K 3.9  CL 99  CO2 27  BUN 16  CREATININE 0.85  CALCIUM 9.1  PROT 6.8  BILITOT 1.0  ALKPHOS 57  ALT 64*  AST 70*  GLUCOSE 136*   Lab Results  Component Value Date   HGBA1C 6.1* 04/22/2012   Lab Results  Component Value Date   CKTOTAL 119 04/22/2012   CKMB 6.0* 04/22/2012   TROPONINI >20.00* 04/23/2012   Lipid Panel     Component Value Date/Time   CHOL 141 04/22/2012 1800   TRIG 82 04/22/2012 1800   HDL 53 04/22/2012 1800   CHOLHDL 2.7 04/22/2012 1800   VLDL 16 04/22/2012 1800   LDLCALC 72 04/22/2012 1800   Radiology: Portable Chest X-ray 1 View 04/22/2012  *RADIOLOGY REPORT*  Clinical Data: STEMI  PORTABLE CHEST - 1 VIEW  Comparison: None.  Findings: Borderline enlarged cardiac silhouette and mediastinal contours, possibly to  be due to decreased lung volumes and AP projection.  Mild cephalization of flow without frank evidence of edema.  Minimal perihilar atelectasis.  No focal airspace opacities.  No definite pleural effusion or pneumothorax. Unchanged bones.  IMPRESSION: Mild pulmonary venous congestion without frank evidence of edema.   Original Report Authenticated By: Tacey Ruiz, MD    Cardiac Cath: 04/22/2012 Left mainstem: 30% distal stenosis.  Left anterior descending (LAD): The proximal LAD was totally occluded with thrombus.  Left circumflex (LCx): 40-50% stenosis in the proximal OM1.  Right  coronary artery (RCA): 60-70% proximal stenosis.  Left ventriculography: The mid to apical anterior akinesis, akinetic apex, mid to apical inferior akinesis. Hyperkinesis of the basal segments. EF 25-30%.  Lesion Data:  Vessel: Proximal LAD  Percent stenosis (pre): 100%  TIMI-flow (pre): 0  Stent: 3.5 x 20 mm Promus stent, 3.0 x 38 mm Promus stent  Percent stenosis (post): 0%  TIMI-flow (post): 3  EKG: 24-Apr-2012 07:33:42  ** Critical Test Result: STEMI Normal sinus rhythm Anteroseptal infarct , possibly acute T wave abnormality, consider lateral ischemia ** ** ACUTE MI / STEMI ** ** Abnormal ECG Evolving anterolateral MI Vent. rate 95 BPM PR interval 148 ms QRS duration 96 ms QT/QTc 386/485 ms P-R-T axes 35 62 78  Echo: 04/23/2012 - Left ventricle: The cavity size was normal. Wall thickness was increased in a pattern of mild LVH. The estimated ejection fraction was 35%. The apex, mid to apical anteroseptal wall, apical anterior wall, and apical inferior wall were akinetic. The mid anterior wall was hypokinetic. Doppler parameters are consistent with abnormal left ventricular relaxation (grade 1 diastolic dysfunction). - Aortic valve: Trileaflet; moderately calcified leaflets. Sclerosis without stenosis. - Mitral valve: No significant regurgitation. - Left atrium: The atrium was mildly dilated. - Right ventricle: The cavity size was normal. Systolic function was normal. - Pulmonary arteries: No complete TR doppler jet so unable to estimate PA systolic pressure. - Systemic veins: IVC measured 2.2 cm with some respirophasic variation, suggesting RA pressure 10 mmHg. Impressions: - Normal LV size with mild LVH. EF 35% with wall motion abnormalities as described above. Normal RV size and systoilc function. Aortic valve sclerosis without significant stenosis.   FOLLOW UP PLANS AND APPOINTMENTS Allergies  Allergen Reactions  . Sulfa Antibiotics     Unknown reaction      Medication List    TAKE these medications       albuterol 108 (90 BASE) MCG/ACT inhaler  Commonly known as:  PROVENTIL HFA;VENTOLIN HFA  Inhale 2 puffs into the lungs every 6 (six) hours as needed. For shortness of breath     aspirin 81 MG chewable tablet  Chew 81 mg by mouth daily.     atorvastatin 80 MG tablet  Commonly known as:  LIPITOR  Take 1 tablet (80 mg total) by mouth daily.     carvedilol 3.125 MG tablet  Commonly known as:  COREG  Take 1 tablet (3.125 mg total) by mouth 2 (two) times daily with a meal.     Dexlansoprazole 30 MG capsule  Take 30 mg by mouth daily.     digoxin 0.25 MG tablet  Commonly known as:  LANOXIN  Take 1 tablet (0.25 mg total) by mouth daily.     enalapril 2.5 MG tablet  Commonly known as:  VASOTEC  Take 1 tablet (2.5 mg total) by mouth 2 (two) times daily.     nitroGLYCERIN 0.4 MG SL tablet  Commonly known as:  NITROSTAT  Place 1 tablet (0.4 mg total) under the tongue every 5 (five) minutes as needed. For chest pain     Ticagrelor 90 MG Tabs tablet  Commonly known as:  BRILINTA  Take 1 tablet (90 mg total) by mouth 2 (two) times daily.        Discharge Orders   Future Appointments Provider Department Dept Phone   05/03/2012 2:15 PM [name], MD [facility] Main Office [place]) [phone]   Future Orders Complete By Expires     Amb Referral to Cardiac Rehabilitation  As directed       Follow-up Information   Follow up with Peter [place], MD On 05/03/2012. (at 2:15 pm)    Contact information:   [address] [place] [address] [phone]       Follow up with Dr Scherrie Merritts On 05/05/2012. (at 12:30 pm)       BRING ALL MEDICATIONS WITH YOU TO FOLLOW UP APPOINTMENTS  Time spent with patient to include physician time: 38 min Signed: Theodore Demark 04/26/2012, 10:38 AM Co-Sign MD

## 2012-04-26 NOTE — Progress Notes (Signed)
[id number] Cardiac Rehab Pt states that he has walked in hall, denies any problems. Discussed with pt stress reduction and ways that he needs to make more time for himself. Also discussed Outpt. CRP in [place] which he plans to attend.

## 2012-04-26 NOTE — Progress Notes (Signed)
Patient discharge prior to visit on behalf of [employer] program for [employer] employees with [employer]. Will perform post hospital transition of care call.  Raiford Noble, MSN-Ed, RN,BSN  [facility] Liaison  [phone]

## 2012-04-26 NOTE — Discharge Summary (Signed)
Patient seen and examined and history reviewed. Agree with above findings and plan. See earlier rounding note.  Theron Arista JordanMD 04/26/2012 12:12 PM

## 2012-04-26 NOTE — Progress Notes (Signed)
TELEMETRY: Reviewed telemetry pt in NSR: Filed Vitals:   04/25/12 0500 04/25/12 1335 04/25/12 2100 04/26/12 0600  BP: 105/68 107/71 104/71 104/63  Pulse: 79 79 75 68  Temp: 97.7 F (36.5 C) 98.3 F (36.8 C) 98.1 F (36.7 C) 98 F (36.7 C)  TempSrc:  Oral    Resp: 16 18 18 18   Height:      Weight: 197 lb 9.6 oz (89.631 kg)   197 lb 3.2 oz (89.449 kg)  SpO2: 92% 97% 98% 97%   No intake or output data in the 24 hours ending 04/26/12 0839  SUBJECTIVE Feels very well. No recurrent chest pain, SOB, or dizzyness. Ambulating in halls without problems. LABS: Basic Metabolic Panel:  Recent Labs  60/45/40 0510 04/25/12 0550  NA 135 135  K 3.8 3.9  CL 101 99  CO2 26 27  GLUCOSE 147* 136*  BUN 10 16  CREATININE 0.75 0.85  CALCIUM 8.8 9.1  MG  --  2.1   Liver Function Tests:  Recent Labs  04/25/12 0550  AST 70*  ALT 64*  ALKPHOS 57  BILITOT 1.0  PROT 6.8  ALBUMIN 3.2*   No results found for this basename: LIPASE, AMYLASE,  in the last 72 hours CBC:  Recent Labs  04/25/12 0550  WBC 7.0  HGB 15.4  HCT 43.3  MCV 91.5  PLT 166   Radiology/Studies:  Portable Chest X-ray 1 View  04/22/2012  *RADIOLOGY REPORT*  Clinical Data: STEMI  PORTABLE CHEST - 1 VIEW  Comparison: None.  Findings: Borderline enlarged cardiac silhouette and mediastinal contours, possibly to be due to decreased lung volumes and AP projection.  Mild cephalization of flow without frank evidence of edema.  Minimal perihilar atelectasis.  No focal airspace opacities.  No definite pleural effusion or pneumothorax. Unchanged bones.  IMPRESSION: Mild pulmonary venous congestion without frank evidence of edema.   Original Report Authenticated By: Tacey Ruiz, MD    24-Apr-2012 07:33:42 [facility] ROUTINE RECORD Critical Test Result: STEMI Normal sinus rhythm Anteroseptal infarct , possibly acute T wave abnormality, consider lateral ischemia  ACUTE MI / STEMI  Abnormal  ECG Evolving anterolateral MI  Transthoracic Echocardiography  Patient: Kourosh, Jablonsky MR #: [id number] Study Date: 04/23/2012 Gender: M Age: 62 Height: 172.7cm Weight: 93.9kg BSA: 2.51m^2 Pt. Status: Room: MCCL  ADMITTING [place], Coree Riester ATTENDING [place], Bilaal Leib PERFORMING [place], [facility] ORDERING Barrett, Rhonda SONOGRAPHER Georgian Co, RDCS, CCT cc:  ------------------------------------------------------------ LV EF: 35%  ------------------------------------------------------------ Indications: MI - acute 410.91.  ------------------------------------------------------------ Study Conclusions  - Left ventricle: The cavity size was normal. Wall thickness was increased in a pattern of mild LVH. The estimated ejection fraction was 35%. The apex, mid to apical anteroseptal wall, apical anterior wall, and apical inferior wall were akinetic. The mid anterior wall was hypokinetic. Doppler parameters are consistent with abnormal left ventricular relaxation (grade 1 diastolic dysfunction). - Aortic valve: Trileaflet; moderately calcified leaflets. Sclerosis without stenosis. - Mitral valve: No significant regurgitation. - Left atrium: The atrium was mildly dilated. - Right ventricle: The cavity size was normal. Systolic function was normal. - Pulmonary arteries: No complete TR doppler jet so unable to estimate PA systolic pressure. - Systemic veins: IVC measured 2.2 cm with some respirophasic variation, suggesting RA pressure 10 mmHg. Impressions:  - Normal LV size with mild LVH. EF 35% with wall motion abnormalities as described above. Normal RV size and systoilc function. Aortic valve sclerosis without significant stenosis. Transthoracic echocardiography. M-mode, complete 2D, spectral  Doppler, and color Doppler. Height: Height: 172.7cm. Height: 68in. Weight: Weight: 93.9kg. Weight: 206.6lb. Body mass index: BMI: 31.5kg/m^2. Body surface area: BSA: 2.38m^2.  Blood pressure: 102/73. Patient status: Inpatient. Location: ICU/CCU  ------------------------------------------------------------  ------------------------------------------------------------ Left ventricle: The cavity size was normal. Wall thickness was increased in a pattern of mild LVH. The estimated ejection fraction was 35%. The apex, mid to apical anteroseptal wall, apical anterior wall, and apical inferior wall were akinetic. The mid anterior wall was hypokinetic. Doppler parameters are consistent with abnormal left ventricular relaxation (grade 1 diastolic dysfunction).  ------------------------------------------------------------ Aortic valve: Trileaflet; moderately calcified leaflets. Sclerosis without stenosis. Doppler: No regurgitation.  ------------------------------------------------------------ Aorta: Aortic root: The aortic root was normal in size. Ascending aorta: The ascending aorta was normal in size.  ------------------------------------------------------------ Mitral valve: Doppler: There was no evidence for stenosis. No significant regurgitation.  ------------------------------------------------------------ Left atrium: The atrium was mildly dilated.  ------------------------------------------------------------ Right ventricle: The cavity size was normal. Systolic function was normal.  ------------------------------------------------------------ Pulmonic valve: Structurally normal valve. Cusp separation was normal. Doppler: Transvalvular velocity was within the normal range. No regurgitation.  ------------------------------------------------------------ Tricuspid valve: Doppler: No significant regurgitation.  ------------------------------------------------------------ Pulmonary artery: No complete TR doppler jet so unable to estimate PA systolic pressure.  ------------------------------------------------------------ Right atrium: The atrium was  normal in size.  ------------------------------------------------------------ Pericardium: There was no pericardial effusion.  ------------------------------------------------------------ Systemic veins: IVC measured 2.2 cm with some respirophasic variation, suggesting RA pressure 10 mmHg.  ------------------------------------------------------------  2D measurements Normal Doppler Normal Left ventricle measurements LVID ED, 42.3 mm 43-52 Left ventricle chord, Ea, lat 6.91 cm/ ------- PLAX ann, tiss s LVID ES, 28.9 mm 23-38 DP chord, E/Ea, lat 9.78 ------- PLAX ann, tiss FS, chord, 32 % >29 DP PLAX Ea, med 6.25 cm/ ------- LVPW, ED 10.8 mm ------ ann, tiss s IVS/LVPW 1 <1.3 DP ratio, ED E/Ea, med 10.82 ------- Vol ED, 101 ml ------ ann, tiss MOD1 DP Vol ES, 60 ml ------ Mitral valve MOD1 Peak E vel 67.6 cm/ ------- EF, MOD1 41 % ------ s Vol index, 49 ml/m^2 ------ Peak A vel 82.4 cm/ ------- ED, MOD1 s Vol index, 29 ml/m^2 ------ Deceleratio 176 ms 150-230 ES, MOD1 n time Vol ED, 115 ml ------ Peak E/A 0.8 ------- MOD2 ratio Vol ES, 71 ml ------ Systemic veins MOD2 Estimated 5 mm ------- EF, MOD2 38 % ------ CVP Hg Stroke 44 ml ------ Right ventricle vol, MOD2 Sa vel, lat 18.8 cm/ ------- Vol index, 56 ml/m^2 ------ ann, tiss s ED, MOD2 DP Vol index, 34 ml/m^2 ------ ES, MOD2 Stroke 21.3 ml/m^2 ------ index, MOD2 Ventricular septum IVS, ED 10.8 mm ------ Aorta Root diam, 29 mm ------ ED Left atrium AP dim 1.84 cm/m^2 <2.2 index Vol, S 57 ml ------ Vol index, 27.5 ml/m^2 ------ S Right ventricle RVID ED, 23.6 mm 19-38 PLAX  ------------------------------------------------------------ Prepared and Electronically Authenticated by  [name] 2014-02-07T11:[id number]       PHYSICAL EXAM General: Well developed, well nourished, in no acute distress. Head: Normocephalic, atraumatic, sclera non-icteric, no xanthomas, nares are without  discharge. Neck: Negative for carotid bruits. JVD not elevated. Lungs: Clear bilaterally to auscultation without wheezes, rales, or rhonchi. Breathing is unlabored. Heart: RRR S1 S2 without murmurs, rubs, or gallops.  Abdomen: Soft, non-tender, non-distended with normoactive bowel sounds. No hepatomegaly. No rebound/guarding. No obvious abdominal masses. Msk:  Strength and tone appears normal for age. Extremities: No clubbing, cyanosis or edema.  Distal pedal pulses are 2+ and equal bilaterally. Neuro: Alert and oriented X  3. Moves all extremities spontaneously. Psych:  Responds to questions appropriately with a normal affect.  ASSESSMENT AND PLAN: 1. Anterior STEMI s/p DES x 2 proximal-mid LAD. Clinically doing well. No complications. Progressing well with cardiac Rehab. On ASA, Brilinta, ACEi, Coreg, lanoxin, and statin. BP limits further titration at this time. Can adjust meds as outpatient. Plans to attend Cardiac Rehab at [facility]. Wants DC sent to Dr. Juel Burrow in [place]. Follow up in our office 1-2 weeks. Patient states he is planning to make lifestyle/work changes to try and alleviate stress. 2. Dyslipidema. History of myalgias in past on lipitor. Recommend high dose statin therapy initially then may need to reduce depending on tolerance. 3. LV dysfunction. Repeat Echo 3 months. Per Dr. Stephannie Li not strongly recommended.  Principal Problem:   ST elevation myocardial infarction (STEMI) of anterior wall, initial episode of care Active Problems:   Borderline diabetes    Signed, Jhaniya Briski [place] MD,FACC 04/26/2012 8:47 AM

## 2012-04-28 ENCOUNTER — Encounter: Payer: 59 | Admitting: Cardiology

## 2012-05-03 ENCOUNTER — Encounter: Payer: Self-pay | Admitting: Cardiology

## 2012-05-03 ENCOUNTER — Ambulatory Visit (INDEPENDENT_AMBULATORY_CARE_PROVIDER_SITE_OTHER): Payer: 59 | Admitting: Cardiology

## 2012-05-03 VITALS — BP 120/76 | HR 93 | Ht 68.0 in | Wt 200.0 lb

## 2012-05-03 DIAGNOSIS — E785 Hyperlipidemia, unspecified: Secondary | ICD-10-CM | POA: Insufficient documentation

## 2012-05-03 DIAGNOSIS — I2109 ST elevation (STEMI) myocardial infarction involving other coronary artery of anterior wall: Secondary | ICD-10-CM

## 2012-05-03 MED ORDER — CARVEDILOL 6.25 MG PO TABS: 6.2500 mg | ORAL_TABLET | Freq: Two times a day (BID) | ORAL | Status: DC

## 2012-05-03 NOTE — Progress Notes (Signed)
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 He underwent emergent stenting of the proximal to mid LAD with 2 drug-eluting stents. Ejection fraction at cath was 25-30%. Echocardiogram demonstrated an ejection fraction 35%. His hospital course was uncomplicated. His blood pressure was relatively low which limited titration of his medication. He had no significant arrhythmias and was discharged home on February 10. Patient states that he has done well since discharge. He does complain of some fatigue. 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 He denies any shortness of breath. He has had some recurrent chest pain ever since his MI which does not change with nitroglycerin. He did receive CPR during his initial arrest.  Current Outpatient Prescriptions on File Prior to Visit  Medication Sig Dispense Refill  . aspirin 81 MG chewable tablet Chew 81 mg by mouth daily.      Marland Kitchen atorvastatin (LIPITOR) 80 MG tablet Take 1 tablet (80 mg total) by mouth daily.  30 tablet  11  . Dexlansoprazole 30 MG capsule Take 30 mg by mouth as needed.       . digoxin (LANOXIN) 0.25 MG tablet Take 1 tablet (0.25 mg total) by mouth daily.  30 tablet  11  . enalapril (VASOTEC) 2.5 MG tablet Take 1 tablet (2.5 mg total) by mouth 2 (two) times daily.  60 tablet  11  . nitroGLYCERIN (NITROSTAT) 0.4 MG SL tablet Place 1 tablet (0.4 mg total) under the tongue every 5 (five) minutes as needed. For chest pain  25 tablet  3  . Ticagrelor (BRILINTA) 90 MG TABS tablet Take 1 tablet (90 mg total) by mouth 2 (two) times daily.  60 tablet  0  . Ticagrelor (BRILINTA) 90 MG TABS tablet Take 1 tablet (90 mg total) by mouth 2 (two) times daily.  60 tablet  11   No current facility-administered medications on file prior to visit.    Allergies  Allergen Reactions  . Sulfa  Antibiotics     Unknown reaction    Past Medical History  Diagnosis Date  . Borderline diabetes   . GERD (gastroesophageal reflux disease)   . Thyroid nodule   . Acute MI anterior wall subsequent episode care   . Dyslipidemia     History reviewed. No pertinent past surgical history.  History  Smoking status  . Former Smoker  . Types: Cigars  Smokeless tobacco  . Not on file    History  Alcohol Use  . 4.2 oz/week  . 7 Glasses of wine per week    Comment: one glass of wine per day    Family History  Problem Relation Age of Onset  . Hypertension Mother   . Stroke Mother   . COPD Father   . Diabetes Father   . Hypertension Father   . Congestive Heart Failure Father     Review of Systems: The review of systems is positive for fatigue.  All other systems were reviewed and are negative.  Physical Exam: BP 120/76  Pulse 93  Ht 5\' 8"  (1.727 m)  Wt 200 lb (90.719 kg)  BMI 30.42 kg/m2  SpO2 98% He is a pleasant male in no acute distress. HEENT: Unremarkable. No jugular venous distention or bruits. Lungs: Clear Cardiovascular: Regular rate and rhythm, normal S1-S2, no gallop, murmur, or click. Abdomen: Soft and nontender. Extremities: No cyanosis or  edema. Ulcer 2+. Neuro: Alert and oriented x3. Cranial nerves II through XII are intact. LABORATORY DATA: ECG today demonstrates normal sinus rhythm with recent anteroseptal infarct. He has Q waves in leads V1 through V4. There is mild persistent ST elevation in leads V2 through V4.  Assessment / Plan: 1. Anterior STEMI complicated by ventricular fibrillation arrest. Status post DES x2 to the proximal and mid LAD. Clinically he is doing well. I think his residual chest pain is related to his prior CPR. He is planning to participate in cardiac rehabilitation. I recommended increasing his carvedilol to 6.25 mg twice a day. We will continue his other medications as listed. I'll followup again in 4 weeks. We will plan on  repeating an echocardiogram at 3 months. We discussed appropriate lifestyle modification and he appears very motivated. Continue dual antiplatelet therapy for one year. 2. Ischemic cardiomyopathy. Ejection fraction 25-35%. We will try to optimize his beta blocker and ACE inhibitor therapy. Continue Lanoxin. 3. Mild dyslipidemia. LDL prior to infarct was 95. Goal now is less than or equal to 70%. He does have a history of myalgias on statins in the past. We will keep him on 80 of Lipitor for one month and then plan on cutting back to 10-20 mg daily.

## 2012-05-03 NOTE — Patient Instructions (Signed)
Increase Carvedilol to 6.25 mg twice a day.  Continue your other medication  I will see you back in 4 weeks.

## 2012-05-13 ENCOUNTER — Ambulatory Visit (HOSPITAL_COMMUNITY): Payer: 59

## 2012-05-14 ENCOUNTER — Telehealth: Payer: Self-pay

## 2012-05-14 ENCOUNTER — Encounter (HOSPITAL_COMMUNITY)
Admission: RE | Admit: 2012-05-14 | Discharge: 2012-05-14 | Disposition: A | Discharge status: Home / Self Care | Payer: 59 | Source: Ambulatory Visit | Attending: Cardiology | Admitting: Cardiology

## 2012-05-14 MED ORDER — ATORVASTATIN CALCIUM 10 MG PO TABS: 10.0000 mg | ORAL_TABLET | Freq: Every day | ORAL | Status: DC

## 2012-05-14 NOTE — Telephone Encounter (Signed)
Received call from patient 05/13/12 complaining of fatigued, no energy.Stated he saw his PCP and lab work was done LDL 36. Patient wanted to ask Dr.Jordan if he could decrease lipitor and  why was he on digoxin.Lab work received and shown to Dr.Jordan. Dr.Jordan  advised to stop digoxin,decrease lipitor to 10 mg daily.

## 2012-05-17 ENCOUNTER — Encounter (HOSPITAL_COMMUNITY): Payer: 59

## 2012-05-19 ENCOUNTER — Encounter (HOSPITAL_COMMUNITY)
Admission: RE | Admit: 2012-05-19 | Discharge: 2012-05-19 | Disposition: A | Discharge status: Home / Self Care | Payer: 59 | Source: Ambulatory Visit | Attending: Cardiology | Admitting: Cardiology

## 2012-05-19 DIAGNOSIS — Z5189 Encounter for other specified aftercare: Secondary | ICD-10-CM | POA: Insufficient documentation

## 2012-05-19 DIAGNOSIS — I219 Acute myocardial infarction, unspecified: Secondary | ICD-10-CM | POA: Insufficient documentation

## 2012-05-19 DIAGNOSIS — I2510 Atherosclerotic heart disease of native coronary artery without angina pectoris: Secondary | ICD-10-CM | POA: Insufficient documentation

## 2012-05-19 DIAGNOSIS — I10 Essential (primary) hypertension: Secondary | ICD-10-CM | POA: Insufficient documentation

## 2012-05-19 LAB — GLUCOSE, CAPILLARY: Glucose-Capillary: 123 mg/dL — ABNORMAL HIGH (ref 70–99)

## 2012-05-19 NOTE — Progress Notes (Signed)
Pt started cardiac rehab today.  Pt tolerated light exercise without difficulty. Telemetry rhythm Sinus without ectopy. 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  Dr [name] says he has a PRN for celexa. Vital signs stable. Will continue to monitor the patient throughout  the program.

## 2012-05-21 ENCOUNTER — Encounter (HOSPITAL_COMMUNITY): Payer: 59

## 2012-05-24 ENCOUNTER — Encounter (HOSPITAL_COMMUNITY)
Admission: RE | Admit: 2012-05-24 | Discharge: 2012-05-24 | Disposition: A | Discharge status: Home / Self Care | Payer: 59 | Source: Ambulatory Visit | Attending: Cardiology | Admitting: Cardiology

## 2012-05-24 LAB — GLUCOSE, CAPILLARY: Glucose-Capillary: 119 mg/dL — ABNORMAL HIGH (ref 70–99)

## 2012-05-26 ENCOUNTER — Encounter (HOSPITAL_COMMUNITY)
Admission: RE | Admit: 2012-05-26 | Discharge: 2012-05-26 | Disposition: A | Discharge status: Home / Self Care | Payer: 59 | Source: Ambulatory Visit | Attending: Cardiology | Admitting: Cardiology

## 2012-05-28 ENCOUNTER — Encounter (HOSPITAL_COMMUNITY)
Admission: RE | Admit: 2012-05-28 | Discharge: 2012-05-28 | Disposition: A | Discharge status: Home / Self Care | Payer: 59 | Source: Ambulatory Visit | Attending: Cardiology | Admitting: Cardiology

## 2012-05-28 LAB — GLUCOSE, CAPILLARY: Glucose-Capillary: 106 mg/dL — ABNORMAL HIGH (ref 70–99)

## 2012-05-31 ENCOUNTER — Encounter (HOSPITAL_COMMUNITY)
Admission: RE | Admit: 2012-05-31 | Discharge: 2012-05-31 | Disposition: A | Discharge status: Home / Self Care | Payer: 59 | Source: Ambulatory Visit | Attending: Cardiology | Admitting: Cardiology

## 2012-05-31 NOTE — Progress Notes (Signed)
Explained and went over home exercise plan with pt. Pt was eager to get back to home exercise regimen, but I stressed "no running" until his cardiologist gives permission. Pt will walk and bike on his own 2-3 x/wk away from [facility]. Gave pt information packet and handout on exercise guidelines. [id number]   Blima Dessert, MS, ACSM-CES

## 2012-06-02 ENCOUNTER — Encounter (HOSPITAL_COMMUNITY)
Admission: RE | Admit: 2012-06-02 | Discharge: 2012-06-02 | Disposition: A | Discharge status: Home / Self Care | Payer: 59 | Source: Ambulatory Visit | Attending: Cardiology | Admitting: Cardiology

## 2012-06-04 ENCOUNTER — Encounter (HOSPITAL_COMMUNITY)
Admission: RE | Admit: 2012-06-04 | Discharge: 2012-06-04 | Disposition: A | Discharge status: Home / Self Care | Payer: 59 | Source: Ambulatory Visit | Attending: Cardiology | Admitting: Cardiology

## 2012-06-07 ENCOUNTER — Encounter (HOSPITAL_COMMUNITY)
Admission: RE | Admit: 2012-06-07 | Discharge: 2012-06-07 | Disposition: A | Discharge status: Home / Self Care | Payer: 59 | Source: Ambulatory Visit | Attending: Cardiology | Admitting: Cardiology

## 2012-06-09 ENCOUNTER — Encounter (HOSPITAL_COMMUNITY)
Admission: RE | Admit: 2012-06-09 | Discharge: 2012-06-09 | Disposition: A | Discharge status: Home / Self Care | Payer: 59 | Source: Ambulatory Visit | Attending: Cardiology | Admitting: Cardiology

## 2012-06-11 ENCOUNTER — Encounter (HOSPITAL_COMMUNITY)
Admission: RE | Admit: 2012-06-11 | Discharge: 2012-06-11 | Disposition: A | Discharge status: Home / Self Care | Payer: 59 | Source: Ambulatory Visit | Attending: Cardiology | Admitting: Cardiology

## 2012-06-14 ENCOUNTER — Encounter (HOSPITAL_COMMUNITY)
Admission: RE | Admit: 2012-06-14 | Discharge: 2012-06-14 | Disposition: A | Discharge status: Home / Self Care | Payer: 59 | Source: Ambulatory Visit | Attending: Cardiology | Admitting: Cardiology

## 2012-06-16 ENCOUNTER — Encounter (HOSPITAL_COMMUNITY)
Admission: RE | Admit: 2012-06-16 | Discharge: 2012-06-16 | Disposition: A | Discharge status: Home / Self Care | Payer: 59 | Source: Ambulatory Visit | Attending: Cardiology | Admitting: Cardiology

## 2012-06-16 ENCOUNTER — Encounter: Payer: Self-pay | Admitting: Cardiology

## 2012-06-16 ENCOUNTER — Ambulatory Visit (INDEPENDENT_AMBULATORY_CARE_PROVIDER_SITE_OTHER): Payer: 59 | Admitting: Cardiology

## 2012-06-16 VITALS — BP 102/70 | HR 84 | Ht 68.0 in | Wt 194.0 lb

## 2012-06-16 DIAGNOSIS — E785 Hyperlipidemia, unspecified: Secondary | ICD-10-CM

## 2012-06-16 DIAGNOSIS — I2109 ST elevation (STEMI) myocardial infarction involving other coronary artery of anterior wall: Secondary | ICD-10-CM

## 2012-06-16 DIAGNOSIS — Z5189 Encounter for other specified aftercare: Secondary | ICD-10-CM | POA: Insufficient documentation

## 2012-06-16 DIAGNOSIS — R7309 Other abnormal glucose: Secondary | ICD-10-CM

## 2012-06-16 DIAGNOSIS — I219 Acute myocardial infarction, unspecified: Secondary | ICD-10-CM | POA: Insufficient documentation

## 2012-06-16 DIAGNOSIS — I10 Essential (primary) hypertension: Secondary | ICD-10-CM | POA: Insufficient documentation

## 2012-06-16 DIAGNOSIS — R7303 Prediabetes: Secondary | ICD-10-CM

## 2012-06-16 DIAGNOSIS — I2510 Atherosclerotic heart disease of native coronary artery without angina pectoris: Secondary | ICD-10-CM | POA: Insufficient documentation

## 2012-06-16 MED ORDER — ENALAPRIL MALEATE 2.5 MG PO TABS: 2.5000 mg | ORAL_TABLET | Freq: Two times a day (BID) | ORAL | Status: DC

## 2012-06-16 MED ORDER — CITALOPRAM HYDROBROMIDE 20 MG PO TABS: 20.0000 mg | ORAL_TABLET | Freq: Every day | ORAL | Status: DC

## 2012-06-16 NOTE — Patient Instructions (Addendum)
We will increase Celexa to 20 mg daily  Xanax .25 mg prn.  Continue your other medication.   I will see you in 4 months.  months

## 2012-06-17 NOTE — Progress Notes (Signed)
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 He underwent emergent stenting of the proximal to mid LAD with 2 drug-eluting stents. Ejection fraction at cath was 25-30%. Echocardiogram demonstrated an ejection fraction 35%. Since discharge he has done very well. He is participating in cardiac rehabilitation. He wants to be able to increase his activity. He has reduced his work load and has tried to do a better job of managing his stress. He denies any recurrent chest pain or shortness of breath. He was placed on Celexa for anxiety. He reports he had a followup echocardiogram by his primary care in that his ejection fraction was 42%.  Current Outpatient Prescriptions on File Prior to Visit  Medication Sig Dispense Refill  . aspirin 81 MG chewable tablet Chew 81 mg by mouth daily.      Marland Kitchen atorvastatin (LIPITOR) 10 MG tablet Take 1 tablet (10 mg total) by mouth daily.  30 tablet  6  . carvedilol (COREG) 6.25 MG tablet Take 1 tablet (6.25 mg total) by mouth 2 (two) times daily.  180 tablet  3  . Dexlansoprazole 30 MG capsule Take 30 mg by mouth as needed.       . nitroGLYCERIN (NITROSTAT) 0.4 MG SL tablet Place 1 tablet (0.4 mg total) under the tongue every 5 (five) minutes as needed. For chest pain  25 tablet  3   No current facility-administered medications on file prior to visit.    Allergies  Allergen Reactions  . Morphine And Related Nausea And Vomiting  . Sulfa Antibiotics     Unknown reaction    Past Medical History  Diagnosis Date  . Borderline diabetes   . GERD (gastroesophageal reflux disease)   . Thyroid nodule   . Acute MI anterior wall subsequent episode care   . Dyslipidemia   . Anxiety     History reviewed. No pertinent past surgical history.  History  Smoking status  . Former Smoker  . Types: Cigars   Smokeless tobacco  . Not on file    History  Alcohol Use  . 4.2 oz/week  . 7 Glasses of wine per week    Comment: one glass of wine per day    Family History  Problem Relation Age of Onset  . Hypertension Mother   . Stroke Mother   . COPD Father   . Diabetes Father   . Hypertension Father   . Congestive Heart Failure Father     Review of Systems: As noted in history of present illness. His energy level has improved. He has no dizziness. All other systems were reviewed and are negative.  Physical Exam: BP 102/70  Pulse 84  Ht 5\' 8"  (1.727 m)  Wt 194 lb (87.998 kg)  BMI 29.5 kg/m2 He is a pleasant male in no acute distress. HEENT: Unremarkable. No jugular venous distention or bruits. Lungs: Clear Cardiovascular: Regular rate and rhythm, normal S1-S2, soft grade 1-2/6 systolic ejection murmur at the left sternal border. Abdomen: Soft and nontender. Extremities: No cyanosis or edema. Pulses 2+. Neuro: Alert and oriented x3. Cranial nerves II through XII are intact. LABORATORY DATA:   Assessment / Plan: 1. Anterior STEMI complicated by ventricular fibrillation arrest. Status post DES x2 to the proximal and mid LAD. Clinically he is doing well. He is making excellent progress. We will continue his other medications as listed.  I'll followup again in 4 months.  We discussed appropriate lifestyle modification and he appears very motivated. Continue dual antiplatelet therapy for one year. 2. Ischemic cardiomyopathy. Ejection fraction 25-35%. Now improved to 42%. Continue current doses of ACE inhibitor and carvedilol. 3. Mild dyslipidemia. LDL prior to infarct was 95. Goal now is less than or equal to 70%. He does have a history of myalgias on statins in the past. Continue Lipitor 10 mg daily. 4. Anxiety. We will increase his Celexa to 20 mg daily. I've given him a prescription for when necessary Xanax.

## 2012-06-18 ENCOUNTER — Encounter (HOSPITAL_COMMUNITY)
Admission: RE | Admit: 2012-06-18 | Discharge: 2012-06-18 | Disposition: A | Discharge status: Home / Self Care | Payer: 59 | Source: Ambulatory Visit | Attending: Cardiology | Admitting: Cardiology

## 2012-06-21 ENCOUNTER — Encounter (HOSPITAL_COMMUNITY)
Admission: RE | Admit: 2012-06-21 | Discharge: 2012-06-21 | Disposition: A | Discharge status: Home / Self Care | Payer: 59 | Source: Ambulatory Visit | Attending: Cardiology | Admitting: Cardiology

## 2012-06-23 ENCOUNTER — Encounter (HOSPITAL_COMMUNITY)
Admission: RE | Admit: 2012-06-23 | Discharge: 2012-06-23 | Disposition: A | Discharge status: Home / Self Care | Payer: 59 | Source: Ambulatory Visit | Attending: Cardiology | Admitting: Cardiology

## 2012-06-23 NOTE — Progress Notes (Signed)
Gerrald J Wisor 62 y.o. male Nutrition Note Spoke with pt.  Pt is an Actor. Nutrition Plan and Nutrition Survey goals reviewed with pt. Pt is following Step 1 of the Therapeutic Lifestyle Changes diet. Per discussion, pt follows a "semi-vegetarian" diet. Pt eats bacon/sausage weekly and eats cheese daily. Pt wants to lose wt. Pt wt today is down 2.7 lb over the past month. Pt has been trying to lose wt by watching portions and eating an Atkins style diet. Pt encouraged to follow the [employer], skipping phase 1, if he wants to follow a high protein diet.Wt loss tips reviewed.  Pt is pre-diabetic. Last A1c indicates blood glucose well-controlled. Per pt, his A1c is now down to 5.8. Pt states he is following a 150 gm CHO diet "but I don't really count carbs." Pt expressed understanding of the information reviewed. Pt aware of nutrition education classes offered and is unable to attend nutrition classes. Nutrition Diagnosis   Food-and nutrition-related knowledge deficit related to lack of exposure to information as related to diagnosis of: ? CVD ? Pre-DM (A1c 6.1) ?   Obesity related to excessive energy intake as evidenced by a BMI of 30.4  Nutrition RX/ Estimated Daily Nutrition Needs for: wt loss  1500-2000 Kcal, 40-55 gm fat, 10-13 gm sat fat, 1.5-2.0 gm trans-fat, <1500 mg sodium , 175-250 gm CHO   Nutrition Intervention   Pt's individual nutrition plan reviewed with pt.   Benefits of adopting Therapeutic Lifestyle Changes discussed when Medficts reviewed.   Pt to attend the Portion Distortion class   Pt to attend the Diabetes Q & A class - met   Pt given handouts for: ? Nutrition I class ? Nutrition II class ? Diabetes Blitz class   Continue client-centered nutrition education by RD, as part of interdisciplinary care. Goal(s)   Pt to identify and limit food sources of saturated fat, trans fat, and cholesterol   Pt to identify food quantities necessary to achieve: ? wt loss to a  goal wt of 172-184 lb (78.5-83.9 kg) at graduation from cardiac rehab.  Monitor and Evaluate progress toward nutrition goal with team. Nutrition Risk: Change to Moderate   Mickle Plumb, M.Ed, RD, LDN, CDE 06/23/2012 3:47 PM

## 2012-06-25 ENCOUNTER — Encounter (HOSPITAL_COMMUNITY)
Admission: RE | Admit: 2012-06-25 | Discharge: 2012-06-25 | Disposition: A | Discharge status: Home / Self Care | Payer: 59 | Source: Ambulatory Visit | Attending: Cardiology | Admitting: Cardiology

## 2012-06-28 ENCOUNTER — Encounter (HOSPITAL_COMMUNITY)
Admission: RE | Admit: 2012-06-28 | Discharge: 2012-06-28 | Disposition: A | Discharge status: Home / Self Care | Payer: 59 | Source: Ambulatory Visit | Attending: Cardiology | Admitting: Cardiology

## 2012-06-30 ENCOUNTER — Encounter (HOSPITAL_COMMUNITY)
Admission: RE | Admit: 2012-06-30 | Discharge: 2012-06-30 | Disposition: A | Discharge status: Home / Self Care | Payer: 59 | Source: Ambulatory Visit | Attending: Cardiology | Admitting: Cardiology

## 2012-07-02 ENCOUNTER — Encounter (HOSPITAL_COMMUNITY)
Admission: RE | Admit: 2012-07-02 | Discharge: 2012-07-02 | Disposition: A | Discharge status: Home / Self Care | Payer: 59 | Source: Ambulatory Visit | Attending: Cardiology | Admitting: Cardiology

## 2012-07-05 ENCOUNTER — Encounter (HOSPITAL_COMMUNITY): Payer: 59

## 2012-07-07 ENCOUNTER — Other Ambulatory Visit: Payer: Self-pay | Admitting: Cardiology

## 2012-07-07 ENCOUNTER — Encounter (HOSPITAL_COMMUNITY)
Admission: RE | Admit: 2012-07-07 | Discharge: 2012-07-07 | Disposition: A | Discharge status: Home / Self Care | Payer: 59 | Source: Ambulatory Visit | Attending: Cardiology | Admitting: Cardiology

## 2012-07-09 ENCOUNTER — Encounter (HOSPITAL_COMMUNITY)
Admission: RE | Admit: 2012-07-09 | Discharge: 2012-07-09 | Disposition: A | Discharge status: Home / Self Care | Payer: 59 | Source: Ambulatory Visit | Attending: Cardiology | Admitting: Cardiology

## 2012-07-12 ENCOUNTER — Encounter (HOSPITAL_COMMUNITY)
Admission: RE | Admit: 2012-07-12 | Discharge: 2012-07-12 | Disposition: A | Discharge status: Home / Self Care | Payer: 59 | Source: Ambulatory Visit | Attending: Cardiology | Admitting: Cardiology

## 2012-07-14 ENCOUNTER — Encounter (HOSPITAL_COMMUNITY)
Admission: RE | Admit: 2012-07-14 | Discharge: 2012-07-14 | Disposition: A | Discharge status: Home / Self Care | Payer: 59 | Source: Ambulatory Visit | Attending: Cardiology | Admitting: Cardiology

## 2012-07-16 ENCOUNTER — Encounter (HOSPITAL_COMMUNITY)
Admission: RE | Admit: 2012-07-16 | Discharge: 2012-07-16 | Disposition: A | Discharge status: Home / Self Care | Payer: 59 | Source: Ambulatory Visit | Attending: Cardiology | Admitting: Cardiology

## 2012-07-16 DIAGNOSIS — I219 Acute myocardial infarction, unspecified: Secondary | ICD-10-CM | POA: Insufficient documentation

## 2012-07-16 DIAGNOSIS — I10 Essential (primary) hypertension: Secondary | ICD-10-CM | POA: Insufficient documentation

## 2012-07-16 DIAGNOSIS — Z5189 Encounter for other specified aftercare: Secondary | ICD-10-CM | POA: Insufficient documentation

## 2012-07-16 DIAGNOSIS — I2510 Atherosclerotic heart disease of native coronary artery without angina pectoris: Secondary | ICD-10-CM | POA: Insufficient documentation

## 2012-07-19 ENCOUNTER — Encounter (HOSPITAL_COMMUNITY)
Admission: RE | Admit: 2012-07-19 | Discharge: 2012-07-19 | Disposition: A | Discharge status: Home / Self Care | Payer: 59 | Source: Ambulatory Visit | Attending: Cardiology | Admitting: Cardiology

## 2012-07-21 ENCOUNTER — Encounter (HOSPITAL_COMMUNITY)
Admission: RE | Admit: 2012-07-21 | Discharge: 2012-07-21 | Disposition: A | Discharge status: Home / Self Care | Payer: 59 | Source: Ambulatory Visit | Attending: Cardiology | Admitting: Cardiology

## 2012-07-23 ENCOUNTER — Encounter: Payer: Self-pay | Admitting: Cardiology

## 2012-07-23 ENCOUNTER — Encounter (HOSPITAL_COMMUNITY)
Admission: RE | Admit: 2012-07-23 | Discharge: 2012-07-23 | Disposition: A | Discharge status: Home / Self Care | Payer: 59 | Source: Ambulatory Visit | Attending: Cardiology | Admitting: Cardiology

## 2012-07-26 ENCOUNTER — Encounter (HOSPITAL_COMMUNITY)
Admission: RE | Admit: 2012-07-26 | Discharge: 2012-07-26 | Disposition: A | Discharge status: Home / Self Care | Payer: 59 | Source: Ambulatory Visit | Attending: Cardiology | Admitting: Cardiology

## 2012-07-28 ENCOUNTER — Encounter (HOSPITAL_COMMUNITY)
Admission: RE | Admit: 2012-07-28 | Discharge: 2012-07-28 | Disposition: A | Discharge status: Home / Self Care | Payer: 59 | Source: Ambulatory Visit | Attending: Cardiology | Admitting: Cardiology

## 2012-07-30 ENCOUNTER — Encounter (HOSPITAL_COMMUNITY)
Admission: RE | Admit: 2012-07-30 | Discharge: 2012-07-30 | Disposition: A | Discharge status: Home / Self Care | Payer: 59 | Source: Ambulatory Visit | Attending: Cardiology | Admitting: Cardiology

## 2012-08-02 ENCOUNTER — Encounter (HOSPITAL_COMMUNITY): Payer: 59

## 2012-08-04 ENCOUNTER — Encounter (HOSPITAL_COMMUNITY)
Admission: RE | Admit: 2012-08-04 | Discharge: 2012-08-04 | Disposition: A | Discharge status: Home / Self Care | Payer: 59 | Source: Ambulatory Visit | Attending: Cardiology | Admitting: Cardiology

## 2012-08-06 ENCOUNTER — Encounter (HOSPITAL_COMMUNITY): Payer: 59

## 2012-08-09 ENCOUNTER — Encounter (HOSPITAL_COMMUNITY): Payer: 59

## 2012-08-11 ENCOUNTER — Encounter (HOSPITAL_COMMUNITY)
Admission: RE | Admit: 2012-08-11 | Discharge: 2012-08-11 | Disposition: A | Discharge status: Home / Self Care | Payer: 59 | Source: Ambulatory Visit | Attending: Cardiology | Admitting: Cardiology

## 2012-08-13 ENCOUNTER — Encounter (HOSPITAL_COMMUNITY)
Admission: RE | Admit: 2012-08-13 | Discharge: 2012-08-13 | Disposition: A | Discharge status: Home / Self Care | Payer: 59 | Source: Ambulatory Visit | Attending: Cardiology | Admitting: Cardiology

## 2012-08-13 NOTE — Progress Notes (Signed)
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  Patrick Gordon plans to continue exercise on his own.

## 2012-08-16 ENCOUNTER — Encounter (HOSPITAL_COMMUNITY): Payer: 59

## 2012-08-18 ENCOUNTER — Encounter (HOSPITAL_COMMUNITY): Payer: 59

## 2012-08-20 ENCOUNTER — Encounter (HOSPITAL_COMMUNITY): Payer: 59

## 2012-10-14 ENCOUNTER — Encounter: Payer: Self-pay | Admitting: Cardiology

## 2012-10-14 ENCOUNTER — Ambulatory Visit (INDEPENDENT_AMBULATORY_CARE_PROVIDER_SITE_OTHER): Payer: 59 | Admitting: Cardiology

## 2012-10-14 VITALS — BP 128/70 | HR 77 | Ht 68.0 in | Wt 203.8 lb

## 2012-10-14 DIAGNOSIS — E785 Hyperlipidemia, unspecified: Secondary | ICD-10-CM

## 2012-10-14 DIAGNOSIS — I2510 Atherosclerotic heart disease of native coronary artery without angina pectoris: Secondary | ICD-10-CM

## 2012-10-14 MED ORDER — TICAGRELOR 90 MG PO TABS: 90.0000 mg | ORAL_TABLET | Freq: Two times a day (BID) | ORAL | Status: DC

## 2012-10-14 MED ORDER — NITROGLYCERIN 0.4 MG SL SUBL: 0.4000 mg | SUBLINGUAL_TABLET | SUBLINGUAL | Status: DC | PRN

## 2012-10-14 MED ORDER — ENALAPRIL MALEATE 2.5 MG PO TABS: 2.5000 mg | ORAL_TABLET | Freq: Two times a day (BID) | ORAL | Status: DC

## 2012-10-14 MED ORDER — ALPRAZOLAM 0.25 MG PO TABS: 0.2500 mg | ORAL_TABLET | Freq: Every evening | ORAL | Status: DC | PRN

## 2012-10-14 MED ORDER — DEXLANSOPRAZOLE 30 MG PO CPDR: 30.0000 mg | DELAYED_RELEASE_CAPSULE | ORAL | Status: DC | PRN

## 2012-10-14 MED ORDER — CARVEDILOL 6.25 MG PO TABS: 6.2500 mg | ORAL_TABLET | Freq: Two times a day (BID) | ORAL | Status: DC

## 2012-10-14 MED ORDER — ATORVASTATIN CALCIUM 10 MG PO TABS: 10.0000 mg | ORAL_TABLET | Freq: Every day | ORAL | Status: DC

## 2012-10-14 NOTE — Progress Notes (Signed)
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 He underwent emergent stenting of the proximal to mid LAD with 2 drug-eluting stents. Ejection fraction at cath was 25-30%. Echocardiogram demonstrated an ejection fraction 35%.He reports he had a followup echocardiogram by his primary care in that his ejection fraction was 42%. More recently he did a MUGA scan in his own office and reports an ejection fraction of 47%. He completed cardiac rehabilitation. He admits that he hasn't been exercising much on his own. He still has symptoms of anxiety and some depression. There are times when he feels overwhelmed and he may have some mild chest tightness at that time. He takes Xanax and his symptoms resolve. He does report that he had a younger brother age 39 who died in 08/05/2022 of sudden death.  Current Outpatient Prescriptions on File Prior to Visit  Medication Sig Dispense Refill  . aspirin 81 MG chewable tablet Chew 81 mg by mouth daily.      . citalopram (CELEXA) 20 MG tablet Take 1 tablet (20 mg total) by mouth daily.  90 tablet  3   No current facility-administered medications on file prior to visit.    Allergies  Allergen Reactions  . Morphine And Related Nausea And Vomiting  . Sulfa Antibiotics     Unknown reaction    Past Medical History  Diagnosis Date  . Borderline diabetes   . GERD (gastroesophageal reflux disease)   . Thyroid nodule   . Acute MI anterior wall subsequent episode care   . Dyslipidemia   . Anxiety     History reviewed. No pertinent past surgical history.  History  Smoking status  . Former Smoker  . Types: Cigars  Smokeless tobacco  . Not on file    History  Alcohol Use  . 4.2 oz/week  . 7 Glasses of wine per week    Comment: one glass of wine per day    Family History  Problem Relation Age of  Onset  . Hypertension Mother   . Stroke Mother   . COPD Father   . Diabetes Father   . Hypertension Father   . Congestive Heart Failure Father     Review of Systems: As noted in history of present illness. His energy level is good.  All other systems were reviewed and are negative.  Physical Exam: BP 128/70  Pulse 77  Ht 5\' 8"  (1.727 m)  Wt 203 lb 12.8 oz (92.443 kg)  BMI 30.99 kg/m2  SpO2 97% He is a pleasant male in no acute distress. HEENT: Unremarkable. No jugular venous distention or bruits. Lungs: Clear Cardiovascular: Regular rate and rhythm, normal S1-S2, soft grade 1/6 systolic ejection murmur at the left sternal border. Abdomen: Soft and nontender. Extremities: No cyanosis or edema. Pulses 2+. Neuro: Alert and oriented x3. Cranial nerves II through XII are intact.  LABORATORY DATA: Dated 07/23/2012 glucose 113. Uric acid 4.8. Complete chemistry panel was normal. Total cholesterol 105, triglycerides 57, HDL 57, LDL 37. Thyroid studies were normal. CBC was normal. Vitamin D was 38. A1c was 5.4%.  Assessment / Plan: 1. Anterior STEMI complicated by ventricular fibrillation arrest. Status post DES x2 to the proximal and mid LAD. Clinically he is doing well. He is making excellent progress. We will continue his other medications as listed. I'll followup again in 6 months.  We will  consider discontinuation of Brilinta at that time. He needs to increase his aerobic activity and keep his weight and control. 2. Ischemic cardiomyopathy. Initial Ejection fraction 25-35%. Now improved to 47%. Continue current doses of ACE inhibitor and carvedilol. 3. Mild dyslipidemia. LDL prior to infarct was 95. Goal now is less than or equal to 70%.Continue Lipitor 10 mg daily. He is tolerating this well. 4. Anxiety/depression. He is on Celexa and when necessary Xanax. I recommended he discuss this further with his primary care.

## 2012-10-14 NOTE — Patient Instructions (Addendum)
Continue your current therapy  Work on getting your aerobic exercise.  I will see you in 6 months.

## 2012-11-30 ENCOUNTER — Telehealth: Payer: Self-pay | Admitting: Cardiology

## 2012-11-30 NOTE — Telephone Encounter (Signed)
Returned call to cardiac rehab Dr.Jordan signed cardiac rehab form 11/29/12 and it was faxed back.

## 2012-11-30 NOTE — Telephone Encounter (Signed)
New Problem   Pt request to enroll in the Cardiac rehab program// signature and returned fax is needed..... States the initial fax was sent sept 10th// Will refax

## 2012-12-06 ENCOUNTER — Encounter (HOSPITAL_COMMUNITY): Payer: Self-pay

## 2012-12-06 DIAGNOSIS — I10 Essential (primary) hypertension: Secondary | ICD-10-CM | POA: Insufficient documentation

## 2012-12-06 DIAGNOSIS — I2510 Atherosclerotic heart disease of native coronary artery without angina pectoris: Secondary | ICD-10-CM | POA: Insufficient documentation

## 2012-12-06 DIAGNOSIS — Z5189 Encounter for other specified aftercare: Secondary | ICD-10-CM | POA: Insufficient documentation

## 2012-12-06 DIAGNOSIS — I252 Old myocardial infarction: Secondary | ICD-10-CM | POA: Insufficient documentation

## 2012-12-08 ENCOUNTER — Encounter (HOSPITAL_COMMUNITY)
Admission: RE | Admit: 2012-12-08 | Discharge: 2012-12-08 | Disposition: A | Discharge status: Home / Self Care | Payer: Self-pay | Source: Ambulatory Visit | Attending: Cardiology | Admitting: Cardiology

## 2012-12-08 NOTE — Progress Notes (Signed)
Oriented patient to cardiac rehab maintenance program. Tolerated exercise well without c/o.

## 2012-12-10 ENCOUNTER — Encounter (HOSPITAL_COMMUNITY): Payer: 59

## 2012-12-13 ENCOUNTER — Encounter (HOSPITAL_COMMUNITY)
Admission: RE | Admit: 2012-12-13 | Discharge: 2012-12-13 | Disposition: A | Discharge status: Home / Self Care | Payer: Self-pay | Source: Ambulatory Visit | Attending: Cardiology | Admitting: Cardiology

## 2012-12-15 ENCOUNTER — Encounter (HOSPITAL_COMMUNITY)
Admission: RE | Admit: 2012-12-15 | Discharge: 2012-12-15 | Disposition: A | Discharge status: Home / Self Care | Payer: Self-pay | Source: Ambulatory Visit | Attending: Cardiology | Admitting: Cardiology

## 2012-12-15 DIAGNOSIS — I2510 Atherosclerotic heart disease of native coronary artery without angina pectoris: Secondary | ICD-10-CM | POA: Insufficient documentation

## 2012-12-15 DIAGNOSIS — I252 Old myocardial infarction: Secondary | ICD-10-CM | POA: Insufficient documentation

## 2012-12-15 DIAGNOSIS — I10 Essential (primary) hypertension: Secondary | ICD-10-CM | POA: Insufficient documentation

## 2012-12-15 DIAGNOSIS — Z5189 Encounter for other specified aftercare: Secondary | ICD-10-CM | POA: Insufficient documentation

## 2012-12-17 ENCOUNTER — Encounter (HOSPITAL_COMMUNITY)
Admission: RE | Admit: 2012-12-17 | Discharge: 2012-12-17 | Disposition: A | Discharge status: Home / Self Care | Payer: Self-pay | Source: Ambulatory Visit | Attending: Cardiology | Admitting: Cardiology

## 2012-12-20 ENCOUNTER — Encounter (HOSPITAL_COMMUNITY)
Admission: RE | Admit: 2012-12-20 | Discharge: 2012-12-20 | Disposition: A | Discharge status: Home / Self Care | Payer: Self-pay | Source: Ambulatory Visit | Attending: Cardiology | Admitting: Cardiology

## 2012-12-22 ENCOUNTER — Encounter (HOSPITAL_COMMUNITY)
Admission: RE | Admit: 2012-12-22 | Discharge: 2012-12-22 | Disposition: A | Discharge status: Home / Self Care | Payer: Self-pay | Source: Ambulatory Visit | Attending: Cardiology | Admitting: Cardiology

## 2012-12-24 ENCOUNTER — Encounter (HOSPITAL_COMMUNITY)
Admission: RE | Admit: 2012-12-24 | Discharge: 2012-12-24 | Disposition: A | Discharge status: Home / Self Care | Payer: Self-pay | Source: Ambulatory Visit | Attending: Cardiology | Admitting: Cardiology

## 2012-12-27 ENCOUNTER — Encounter (HOSPITAL_COMMUNITY)
Admission: RE | Admit: 2012-12-27 | Discharge: 2012-12-27 | Disposition: A | Discharge status: Home / Self Care | Payer: Self-pay | Source: Ambulatory Visit | Attending: Cardiology | Admitting: Cardiology

## 2012-12-29 ENCOUNTER — Encounter (HOSPITAL_COMMUNITY)
Admission: RE | Admit: 2012-12-29 | Discharge: 2012-12-29 | Disposition: A | Discharge status: Home / Self Care | Payer: Self-pay | Source: Ambulatory Visit | Attending: Cardiology | Admitting: Cardiology

## 2012-12-31 ENCOUNTER — Encounter (HOSPITAL_COMMUNITY)
Admission: RE | Admit: 2012-12-31 | Discharge: 2012-12-31 | Disposition: A | Discharge status: Home / Self Care | Payer: Self-pay | Source: Ambulatory Visit | Attending: Cardiology | Admitting: Cardiology

## 2013-01-03 ENCOUNTER — Encounter (HOSPITAL_COMMUNITY): Payer: 59

## 2013-01-05 ENCOUNTER — Encounter (HOSPITAL_COMMUNITY)
Admission: RE | Admit: 2013-01-05 | Discharge: 2013-01-05 | Disposition: A | Discharge status: Home / Self Care | Payer: Self-pay | Source: Ambulatory Visit | Attending: Cardiology | Admitting: Cardiology

## 2013-01-07 ENCOUNTER — Encounter (HOSPITAL_COMMUNITY)
Admission: RE | Admit: 2013-01-07 | Discharge: 2013-01-07 | Disposition: A | Discharge status: Home / Self Care | Payer: Self-pay | Source: Ambulatory Visit | Attending: Cardiology | Admitting: Cardiology

## 2013-01-10 ENCOUNTER — Encounter (HOSPITAL_COMMUNITY)
Admission: RE | Admit: 2013-01-10 | Discharge: 2013-01-10 | Disposition: A | Discharge status: Home / Self Care | Payer: Self-pay | Source: Ambulatory Visit | Attending: Cardiology | Admitting: Cardiology

## 2013-01-12 ENCOUNTER — Encounter (HOSPITAL_COMMUNITY)
Admission: RE | Admit: 2013-01-12 | Discharge: 2013-01-12 | Disposition: A | Discharge status: Home / Self Care | Payer: Self-pay | Source: Ambulatory Visit | Attending: Cardiology | Admitting: Cardiology

## 2013-01-14 ENCOUNTER — Encounter (HOSPITAL_COMMUNITY): Payer: 59

## 2013-01-17 ENCOUNTER — Encounter (HOSPITAL_COMMUNITY)
Admission: RE | Admit: 2013-01-17 | Discharge: 2013-01-17 | Disposition: A | Discharge status: Home / Self Care | Payer: 59 | Source: Ambulatory Visit | Attending: Cardiology | Admitting: Cardiology

## 2013-01-17 DIAGNOSIS — I252 Old myocardial infarction: Secondary | ICD-10-CM | POA: Insufficient documentation

## 2013-01-17 DIAGNOSIS — I2510 Atherosclerotic heart disease of native coronary artery without angina pectoris: Secondary | ICD-10-CM | POA: Insufficient documentation

## 2013-01-17 DIAGNOSIS — I10 Essential (primary) hypertension: Secondary | ICD-10-CM | POA: Insufficient documentation

## 2013-01-17 DIAGNOSIS — Z5189 Encounter for other specified aftercare: Secondary | ICD-10-CM | POA: Insufficient documentation

## 2013-01-19 ENCOUNTER — Encounter (HOSPITAL_COMMUNITY)
Admission: RE | Admit: 2013-01-19 | Discharge: 2013-01-19 | Disposition: A | Discharge status: Home / Self Care | Payer: 59 | Source: Ambulatory Visit | Attending: Cardiology | Admitting: Cardiology

## 2013-01-21 ENCOUNTER — Encounter (HOSPITAL_COMMUNITY)
Admission: RE | Admit: 2013-01-21 | Discharge: 2013-01-21 | Disposition: A | Discharge status: Home / Self Care | Payer: 59 | Source: Ambulatory Visit | Attending: Cardiology | Admitting: Cardiology

## 2013-01-24 ENCOUNTER — Encounter (HOSPITAL_COMMUNITY): Payer: 59

## 2013-01-26 ENCOUNTER — Encounter (HOSPITAL_COMMUNITY): Payer: 59

## 2013-01-28 ENCOUNTER — Encounter (HOSPITAL_COMMUNITY): Payer: 59

## 2013-01-31 ENCOUNTER — Encounter (HOSPITAL_COMMUNITY)
Admission: RE | Admit: 2013-01-31 | Discharge: 2013-01-31 | Disposition: A | Discharge status: Home / Self Care | Payer: 59 | Source: Ambulatory Visit | Attending: Cardiology | Admitting: Cardiology

## 2013-02-02 ENCOUNTER — Encounter (HOSPITAL_COMMUNITY)
Admission: RE | Admit: 2013-02-02 | Discharge: 2013-02-02 | Disposition: A | Discharge status: Home / Self Care | Payer: 59 | Source: Ambulatory Visit | Attending: Cardiology | Admitting: Cardiology

## 2013-02-04 ENCOUNTER — Encounter (HOSPITAL_COMMUNITY)
Admission: RE | Admit: 2013-02-04 | Discharge: 2013-02-04 | Disposition: A | Discharge status: Home / Self Care | Payer: 59 | Source: Ambulatory Visit | Attending: Cardiology | Admitting: Cardiology

## 2013-02-07 ENCOUNTER — Encounter (HOSPITAL_COMMUNITY)
Admission: RE | Admit: 2013-02-07 | Discharge: 2013-02-07 | Disposition: A | Discharge status: Home / Self Care | Payer: 59 | Source: Ambulatory Visit | Attending: Cardiology | Admitting: Cardiology

## 2013-02-09 ENCOUNTER — Encounter (HOSPITAL_COMMUNITY): Payer: 59

## 2013-02-14 ENCOUNTER — Encounter (HOSPITAL_COMMUNITY)
Admission: RE | Admit: 2013-02-14 | Discharge: 2013-02-14 | Disposition: A | Discharge status: Home / Self Care | Payer: Self-pay | Source: Ambulatory Visit | Attending: Cardiology | Admitting: Cardiology

## 2013-02-14 DIAGNOSIS — I10 Essential (primary) hypertension: Secondary | ICD-10-CM | POA: Insufficient documentation

## 2013-02-14 DIAGNOSIS — Z5189 Encounter for other specified aftercare: Secondary | ICD-10-CM | POA: Insufficient documentation

## 2013-02-14 DIAGNOSIS — I2510 Atherosclerotic heart disease of native coronary artery without angina pectoris: Secondary | ICD-10-CM | POA: Insufficient documentation

## 2013-02-14 DIAGNOSIS — I252 Old myocardial infarction: Secondary | ICD-10-CM | POA: Insufficient documentation

## 2013-02-16 ENCOUNTER — Encounter (HOSPITAL_COMMUNITY)
Admission: RE | Admit: 2013-02-16 | Discharge: 2013-02-16 | Disposition: A | Discharge status: Home / Self Care | Payer: Self-pay | Source: Ambulatory Visit | Attending: Cardiology | Admitting: Cardiology

## 2013-02-18 ENCOUNTER — Encounter (HOSPITAL_COMMUNITY)
Admission: RE | Admit: 2013-02-18 | Discharge: 2013-02-18 | Disposition: A | Discharge status: Home / Self Care | Payer: 59 | Source: Ambulatory Visit | Attending: Cardiology | Admitting: Cardiology

## 2013-02-21 ENCOUNTER — Encounter (HOSPITAL_COMMUNITY)
Admission: RE | Admit: 2013-02-21 | Discharge: 2013-02-21 | Disposition: A | Discharge status: Home / Self Care | Payer: Self-pay | Source: Ambulatory Visit | Attending: Cardiology | Admitting: Cardiology

## 2013-02-23 ENCOUNTER — Encounter (HOSPITAL_COMMUNITY): Payer: 59

## 2013-02-25 ENCOUNTER — Encounter (HOSPITAL_COMMUNITY): Payer: 59

## 2013-02-28 ENCOUNTER — Encounter (HOSPITAL_COMMUNITY): Payer: 59

## 2013-03-02 ENCOUNTER — Encounter (HOSPITAL_COMMUNITY): Payer: 59

## 2013-03-04 ENCOUNTER — Encounter (HOSPITAL_COMMUNITY): Payer: 59

## 2013-03-07 ENCOUNTER — Encounter (HOSPITAL_COMMUNITY): Payer: 59

## 2013-03-09 ENCOUNTER — Encounter (HOSPITAL_COMMUNITY): Payer: 59

## 2013-03-14 ENCOUNTER — Encounter (HOSPITAL_COMMUNITY): Payer: 59

## 2013-03-15 ENCOUNTER — Telehealth (HOSPITAL_COMMUNITY): Payer: Self-pay | Admitting: *Deleted

## 2013-03-16 ENCOUNTER — Encounter (HOSPITAL_COMMUNITY): Payer: 59

## 2013-03-18 ENCOUNTER — Encounter (HOSPITAL_COMMUNITY): Payer: 59 | Attending: Cardiology

## 2013-03-18 DIAGNOSIS — I252 Old myocardial infarction: Secondary | ICD-10-CM | POA: Insufficient documentation

## 2013-03-18 DIAGNOSIS — I10 Essential (primary) hypertension: Secondary | ICD-10-CM | POA: Insufficient documentation

## 2013-03-18 DIAGNOSIS — Z5189 Encounter for other specified aftercare: Secondary | ICD-10-CM | POA: Insufficient documentation

## 2013-03-18 DIAGNOSIS — I2510 Atherosclerotic heart disease of native coronary artery without angina pectoris: Secondary | ICD-10-CM | POA: Insufficient documentation

## 2013-03-21 ENCOUNTER — Encounter (HOSPITAL_COMMUNITY): Payer: 59

## 2013-03-23 ENCOUNTER — Encounter (HOSPITAL_COMMUNITY): Payer: 59

## 2013-03-25 ENCOUNTER — Encounter (HOSPITAL_COMMUNITY): Payer: 59

## 2013-03-28 ENCOUNTER — Encounter (HOSPITAL_COMMUNITY): Payer: 59

## 2013-03-30 ENCOUNTER — Encounter (HOSPITAL_COMMUNITY): Payer: 59

## 2013-04-01 ENCOUNTER — Encounter (HOSPITAL_COMMUNITY): Payer: 59

## 2013-04-04 ENCOUNTER — Encounter (HOSPITAL_COMMUNITY): Payer: 59

## 2013-04-05 ENCOUNTER — Encounter: Payer: Self-pay | Admitting: Cardiology

## 2013-04-06 ENCOUNTER — Encounter: Payer: Self-pay | Admitting: Cardiology

## 2013-04-06 ENCOUNTER — Encounter (HOSPITAL_COMMUNITY): Payer: 59

## 2013-04-07 ENCOUNTER — Ambulatory Visit: Payer: Self-pay | Admitting: Internal Medicine

## 2013-04-08 ENCOUNTER — Encounter (HOSPITAL_COMMUNITY): Payer: 59

## 2013-04-11 ENCOUNTER — Encounter (HOSPITAL_COMMUNITY): Payer: 59

## 2013-04-13 ENCOUNTER — Encounter (HOSPITAL_COMMUNITY): Payer: 59

## 2013-04-15 ENCOUNTER — Telehealth (HOSPITAL_COMMUNITY): Payer: Self-pay | Admitting: *Deleted

## 2013-04-15 ENCOUNTER — Encounter (HOSPITAL_COMMUNITY): Payer: 59

## 2013-04-15 ENCOUNTER — Ambulatory Visit: Payer: Self-pay | Admitting: Internal Medicine

## 2013-04-15 ENCOUNTER — Ambulatory Visit: Payer: 59 | Admitting: Cardiology

## 2013-04-17 ENCOUNTER — Ambulatory Visit: Payer: Self-pay | Admitting: Internal Medicine

## 2013-04-18 ENCOUNTER — Encounter (HOSPITAL_COMMUNITY): Payer: 59 | Attending: Cardiology

## 2013-04-18 DIAGNOSIS — I10 Essential (primary) hypertension: Secondary | ICD-10-CM | POA: Insufficient documentation

## 2013-04-18 DIAGNOSIS — I252 Old myocardial infarction: Secondary | ICD-10-CM | POA: Insufficient documentation

## 2013-04-18 DIAGNOSIS — I2510 Atherosclerotic heart disease of native coronary artery without angina pectoris: Secondary | ICD-10-CM | POA: Insufficient documentation

## 2013-04-18 DIAGNOSIS — Z5189 Encounter for other specified aftercare: Secondary | ICD-10-CM | POA: Insufficient documentation

## 2013-04-19 ENCOUNTER — Other Ambulatory Visit: Payer: Self-pay | Admitting: Cardiology

## 2013-04-19 DIAGNOSIS — R9439 Abnormal result of other cardiovascular function study: Secondary | ICD-10-CM

## 2013-04-20 ENCOUNTER — Encounter (HOSPITAL_COMMUNITY): Payer: Self-pay | Admitting: Pharmacy Technician

## 2013-04-20 ENCOUNTER — Encounter (HOSPITAL_COMMUNITY): Payer: 59

## 2013-04-22 ENCOUNTER — Ambulatory Visit (HOSPITAL_COMMUNITY)
Admission: RE | Admit: 2013-04-22 | Discharge: 2013-04-22 | Disposition: A | Discharge status: Home / Self Care | Payer: 59 | Source: Ambulatory Visit | Attending: Cardiology | Admitting: Cardiology

## 2013-04-22 ENCOUNTER — Encounter (HOSPITAL_COMMUNITY)
Admission: RE | Disposition: A | Discharge status: Home / Self Care | Payer: Self-pay | Source: Ambulatory Visit | Attending: Cardiology

## 2013-04-22 ENCOUNTER — Encounter (HOSPITAL_COMMUNITY): Payer: 59

## 2013-04-22 DIAGNOSIS — R9439 Abnormal result of other cardiovascular function study: Secondary | ICD-10-CM

## 2013-04-22 DIAGNOSIS — R7309 Other abnormal glucose: Secondary | ICD-10-CM | POA: Insufficient documentation

## 2013-04-22 DIAGNOSIS — Z9861 Coronary angioplasty status: Secondary | ICD-10-CM | POA: Insufficient documentation

## 2013-04-22 DIAGNOSIS — E785 Hyperlipidemia, unspecified: Secondary | ICD-10-CM | POA: Insufficient documentation

## 2013-04-22 DIAGNOSIS — K219 Gastro-esophageal reflux disease without esophagitis: Secondary | ICD-10-CM | POA: Insufficient documentation

## 2013-04-22 DIAGNOSIS — I2510 Atherosclerotic heart disease of native coronary artery without angina pectoris: Secondary | ICD-10-CM

## 2013-04-22 DIAGNOSIS — F411 Generalized anxiety disorder: Secondary | ICD-10-CM | POA: Insufficient documentation

## 2013-04-22 DIAGNOSIS — I2589 Other forms of chronic ischemic heart disease: Secondary | ICD-10-CM | POA: Insufficient documentation

## 2013-04-22 DIAGNOSIS — Z7982 Long term (current) use of aspirin: Secondary | ICD-10-CM | POA: Insufficient documentation

## 2013-04-22 DIAGNOSIS — I252 Old myocardial infarction: Secondary | ICD-10-CM | POA: Insufficient documentation

## 2013-04-22 DIAGNOSIS — Z87891 Personal history of nicotine dependence: Secondary | ICD-10-CM | POA: Insufficient documentation

## 2013-04-22 HISTORY — PX: LEFT HEART CATHETERIZATION WITH CORONARY ANGIOGRAM: SHX5451

## 2013-04-22 LAB — PROTIME-INR
INR: 1.07 (ref 0.00–1.49)
Prothrombin Time: 13.7 s (ref 11.6–15.2)

## 2013-04-22 SURGERY — LEFT HEART CATHETERIZATION WITH CORONARY ANGIOGRAM
Anesthesia: LOCAL

## 2013-04-22 MED ORDER — SODIUM CHLORIDE 0.9 % IJ SOLN: 3.0000 mL | INTRAMUSCULAR | Status: DC | PRN

## 2013-04-22 MED ORDER — SODIUM CHLORIDE 0.9 % IV SOLN
250.0000 mL | INTRAVENOUS | Status: DC | PRN
Start: 2013-04-22 — End: 2013-04-23

## 2013-04-22 MED ORDER — FENTANYL CITRATE 0.05 MG/ML IJ SOLN
INTRAMUSCULAR | Status: AC
  Filled 2013-04-22: qty 2

## 2013-04-22 MED ORDER — ACETAMINOPHEN 325 MG PO TABS: 650.0000 mg | ORAL_TABLET | ORAL | Status: DC | PRN

## 2013-04-22 MED ORDER — HEPARIN (PORCINE) IN NACL 2-0.9 UNIT/ML-% IJ SOLN
INTRAMUSCULAR | Status: AC
  Filled 2013-04-22: qty 1000

## 2013-04-22 MED ORDER — SODIUM CHLORIDE 0.9 % IJ SOLN: 3.0000 mL | Freq: Two times a day (BID) | INTRAMUSCULAR | Status: DC

## 2013-04-22 MED ORDER — SODIUM CHLORIDE 0.9 % IV SOLN: 1.0000 mL/kg/h | INTRAVENOUS | Status: DC

## 2013-04-22 MED ORDER — SODIUM CHLORIDE 0.9 % IV SOLN
INTRAVENOUS | Status: DC
  Administered 2013-04-22: 12:00:00 via INTRAVENOUS

## 2013-04-22 MED ORDER — ASPIRIN 81 MG PO CHEW: 81.0000 mg | CHEWABLE_TABLET | ORAL | Status: DC

## 2013-04-22 MED ORDER — ONDANSETRON HCL 4 MG/2ML IJ SOLN: 4.0000 mg | Freq: Four times a day (QID) | INTRAMUSCULAR | Status: DC | PRN

## 2013-04-22 MED ORDER — HEPARIN SODIUM (PORCINE) 1000 UNIT/ML IJ SOLN
INTRAMUSCULAR | Status: AC
  Filled 2013-04-22: qty 1

## 2013-04-22 MED ORDER — LIDOCAINE HCL (PF) 1 % IJ SOLN
INTRAMUSCULAR | Status: AC
  Filled 2013-04-22: qty 30

## 2013-04-22 MED ORDER — VERAPAMIL HCL 2.5 MG/ML IV SOLN
INTRAVENOUS | Status: AC
  Filled 2013-04-22: qty 2

## 2013-04-22 MED ORDER — NITROGLYCERIN 0.2 MG/ML ON CALL CATH LAB
INTRAVENOUS | Status: AC
  Filled 2013-04-22: qty 1

## 2013-04-22 MED ORDER — MIDAZOLAM HCL 2 MG/2ML IJ SOLN
INTRAMUSCULAR | Status: AC
  Filled 2013-04-22: qty 2

## 2013-04-22 NOTE — CV Procedure (Signed)
    Cardiac Catheterization Procedure Note  Name: Patrick MulderShamil J Gordon MRN: [id number] DOB: [birth year]  Procedure: Left Heart Cath, Selective Coronary Angiography, LV angiography  Indication: 63 yo male with history of prior anterior STEMI with stenting of LAD. He has been experiencing atypical chest pain. Nuclear study was low risk.   Procedural Details: The right wrist was prepped, draped, and anesthetized with 1% lidocaine. Using the modified Seldinger technique, a 5 French sheath was introduced into the right radial artery. 3 mg of verapamil was administered through the sheath, weight-based unfractionated heparin was administered intravenously. Standard Judkins catheters were used for selective coronary angiography and left ventriculography. Catheter exchanges were performed over an exchange length guidewire. There were no immediate procedural complications. A TR band was used for radial hemostasis at the completion of the procedure.  The patient was transferred to the post catheterization recovery area for further monitoring.  Procedural Findings: Hemodynamics: AO 125/71 mean 96 mm Hg LV 138/19 mm Hg  Coronary angiography: Coronary dominance: right  Left mainstem: Normal  Left anterior descending (LAD): The proximal to mid LAD has a long segment of stents. The stents are widely patent. The first diagonal has mild disease 30-40% at the ostium.  Left circumflex (LCx): Mild irregularities up to 20%.  Right coronary artery (RCA): There is modest plaque throughout the mid vessel up to 30%.   Left ventriculography: Left ventricular systolic function is abnormal, there is focal apical akinesis, LVEF is estimated at 50-55%, there is no significant mitral regurgitation   Final Conclusions:   1. Nonobstructive CAD. Continued patency of stents in the LAD. 2. Focal apical wall motion abnormality with good LV systolic function.  Recommendations: Continue medical therapy.  Theron Aristaeter  [facility] CenterJordanMD,FACC 04/22/2013, 2:52 PM

## 2013-04-22 NOTE — H&P (Signed)
Physician History and Physical    Patient ID: Patrick Gordon MRN: [id number] DOB/AGE: [birth year] 62 y.o. 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 He underwent emergent stenting of the proximal to mid LAD with 2 drug-eluting stents. Ejection fraction at cath was 25-30%. Echocardiogram demonstrated an ejection fraction 35%.He reports he had a followup echocardiogram by his primary care in that his ejection fraction was 42%. More recently he has complained of symptoms of breathlessness and difficulty taking a deep breath. He had complete lab work including cardiac enzymes and BNP which were normal. [place] had [place]. He was able to walk 8 minutes on the Bruce protocol without symptoms or Ecg changes. There was evidence of prior anteroapical infarct with peri-infarct ischemia. This was felt to be a low risk study but he remains very anxious about his symptoms and desires definitive evaluation with cardiac cath.    Review of systems complete and found to be negative unless listed above  Past Medical History  Diagnosis Date  . Borderline diabetes   . GERD (gastroesophageal reflux disease)   . Thyroid nodule   . Acute MI anterior wall subsequent episode care   . Dyslipidemia   . Anxiety   . CAD (coronary artery disease)     Family History  Problem Relation Age of Onset  . Hypertension Mother   . Stroke Mother   . COPD Father   . Diabetes Father   . Hypertension Father   . Congestive Heart Failure Father     History   Social History  . Marital Status: Single    Spouse Name: N/A    Number of Children: N/A  . Years of Education: N/A   Occupational History  . MD     [employer]   Social History Main Topics  . Smoking status: Former Smoker    Types: Cigars  . Smokeless  tobacco: Not on file  . Alcohol Use: 4.2 oz/week    7 Glasses of wine per week     Comment: one glass of wine per day  . Drug Use: No  . Sexual Activity: Not on file   Other Topics Concern  . Not on file   Social History Narrative  . No narrative on file    No past surgical history on file.   Prescriptions prior to admission  Medication Sig Dispense Refill  . ALPRAZolam (XANAX) 0.25 MG tablet Take 0.25 mg by mouth at bedtime as needed for sleep.      Marland Kitchen. aspirin 81 MG chewable tablet Chew 81 mg by mouth daily.      Marland Kitchen. atorvastatin (LIPITOR) 10 MG tablet Take 10 mg by mouth daily.      . carvedilol (COREG) 6.25 MG tablet Take 6.25 mg by mouth 2 (two) times daily.      . citalopram (CELEXA) 20 MG tablet Take 20 mg by mouth daily.      . enalapril (VASOTEC) 2.5 MG tablet Take 2.5 mg by mouth 2 (two) times daily.      . fluticasone (FLONASE) 50 MCG/ACT nasal spray Place 2 sprays into both nostrils 2 (two) times daily.      . nitroGLYCERIN (NITROSTAT) 0.4 MG SL tablet Place 0.4 mg under the tongue every 5 (five) minutes as needed for chest pain. For chest pain      .  Ticagrelor (BRILINTA) 90 MG TABS tablet Take 90 mg by mouth 2 (two) times daily.        Physical Exam: Blood pressure 142/74, pulse 65, temperature 97.9 F (36.6 C), temperature source Oral, resp. rate 18, height 5\' 9"  (1.753 m), weight 205 lb (92.987 kg), SpO2 97.00%.   He is a pleasant male in no acute distress.  HEENT: Unremarkable. No jugular venous distention or bruits.  Lungs: Clear  Cardiovascular: Regular rate and rhythm, normal S1-S2, soft grade 1/6 systolic ejection murmur at the left sternal border.  Abdomen: Soft and nontender.  Extremities: No cyanosis or edema. Pulses 2+.  Neuro: Alert and oriented x3. Cranial nerves II through XII are intact.     Labs:   Lab Results  Component Value Date   WBC 7.0 04/25/2012   HGB 15.4 04/25/2012   HCT 43.3 04/25/2012   MCV 91.5 04/25/2012   PLT 166 04/25/2012   No results  found for this basename: NA, K, CL, CO2, BUN, CREATININE, CALCIUM, LABALBU, PROT, BILITOT, ALKPHOS, ALT, AST, GLUCOSE,  in the last 168 hours Lab Results  Component Value Date   CKTOTAL 119 04/22/2012   CKMB 6.0* 04/22/2012   TROPONINI >20.00* 04/23/2012    Lab Results  Component Value Date   CHOL 141 04/22/2012   Lab Results  Component Value Date   HDL 53 04/22/2012   Lab Results  Component Value Date   LDLCALC 72 04/22/2012   Lab Results  Component Value Date   TRIG 82 04/22/2012   Lab Results  Component Value Date   CHOLHDL 2.7 04/22/2012   No results found for this basename: LDLDIRECT     Recent lab from [facility] 04/07/13 scanned into Epic.   ASSESSMENT AND PLAN:  1. Atypical chest pain. Recent myoview relatively low risk. EF 51%, Given ongoing symptoms will perform cardiac cath. 2. Dyslipidemia. On statin. 3. Anxiety. 4. Ischemic cardiomyopathy. Recent EF improved.  SignedTheron Arista [facility] 04/22/2013, 1:58 PM

## 2013-04-22 NOTE — Interval H&P Note (Signed)
History and Physical Interval Note:  04/22/2013 2:24 PM  Patrick Gordon  has presented today for surgery, with the diagnosis of Chest pain  The various methods of treatment have been discussed with the patient and family. After consideration of risks, benefits and other options for treatment, the patient has consented to  Procedure(s): LEFT HEART CATHETERIZATION WITH CORONARY ANGIOGRAM (N/A) as a surgical intervention .  The patient's history has been reviewed, patient examined, no change in status, stable for surgery.  I have reviewed the patient's chart and labs.  Questions were answered to the patient's satisfaction.   Cath Lab Visit (complete for each Cath Lab visit)  Clinical Evaluation Leading to the Procedure:   ACS: no  Non-ACS:    Anginal Classification: CCS II  Anti-ischemic medical therapy: Maximal Therapy (2 or more classes of medications)  Non-Invasive Test Results: Low-risk stress test findings: cardiac mortality <1%/year  Prior CABG: No previous CABG        Theron Aristaeter [facility]JordanMD,FACC 04/22/2013 2:24 PM

## 2013-04-22 NOTE — Progress Notes (Signed)
Attempted to removed air from band twice; once at 1540 and again at 1620. Both times the puncture site bled immediately. Judie GrieveBryan PA paged and reported the incidences. Instructed to wait an hour and then attempt to remove air again. Informed pt and will monitor.

## 2013-04-22 NOTE — Progress Notes (Signed)
Dr. SwazilandJordan in to evaluate patient. No new orders received.

## 2013-04-22 NOTE — Progress Notes (Signed)
Patrick Gordon from cath lab came over and looked at pt's wrist. Patrick Gordon informed Dr. SwazilandJordan of situation with pt radial site bleeding. Dr. SwazilandJordan instructed to wait one hour and then attempt to deflate band. Pt informed and will monitor.

## 2013-04-22 NOTE — Progress Notes (Signed)
Attempted to remove 3 cc air out of TR band, site immediately bled. Fortunato Curlinghonda and Bryan PA paged. Awaiting on call back.

## 2013-04-22 NOTE — Progress Notes (Deleted)
Assumed care of pt from [place] Pepperrystal Lamb, RN. 13cc of air present in TR Band. Assessment documented.

## 2013-04-22 NOTE — Discharge Instructions (Signed)

## 2013-04-25 ENCOUNTER — Encounter (HOSPITAL_COMMUNITY): Payer: 59

## 2013-04-27 ENCOUNTER — Encounter (HOSPITAL_COMMUNITY): Payer: 59

## 2013-04-29 ENCOUNTER — Encounter (HOSPITAL_COMMUNITY): Payer: 59

## 2013-05-02 ENCOUNTER — Encounter (HOSPITAL_COMMUNITY): Payer: 59

## 2013-05-04 ENCOUNTER — Encounter (HOSPITAL_COMMUNITY): Payer: 59

## 2013-05-06 ENCOUNTER — Encounter (HOSPITAL_COMMUNITY): Payer: 59

## 2013-05-09 ENCOUNTER — Encounter (HOSPITAL_COMMUNITY): Payer: 59

## 2013-05-11 ENCOUNTER — Encounter: Payer: 59 | Admitting: Cardiology

## 2013-05-11 ENCOUNTER — Encounter (HOSPITAL_COMMUNITY): Payer: 59

## 2013-05-13 ENCOUNTER — Encounter (HOSPITAL_COMMUNITY): Payer: 59

## 2013-05-16 ENCOUNTER — Encounter (HOSPITAL_COMMUNITY): Payer: 59

## 2013-05-18 ENCOUNTER — Encounter (HOSPITAL_COMMUNITY): Payer: 59

## 2013-05-20 ENCOUNTER — Encounter (HOSPITAL_COMMUNITY): Payer: 59

## 2013-05-23 ENCOUNTER — Encounter (HOSPITAL_COMMUNITY): Payer: 59

## 2013-05-25 ENCOUNTER — Encounter (HOSPITAL_COMMUNITY): Payer: 59

## 2013-05-27 ENCOUNTER — Encounter (HOSPITAL_COMMUNITY): Payer: 59

## 2013-05-30 ENCOUNTER — Encounter (HOSPITAL_COMMUNITY): Payer: 59

## 2013-06-01 ENCOUNTER — Encounter (HOSPITAL_COMMUNITY): Payer: 59

## 2013-06-03 ENCOUNTER — Encounter (HOSPITAL_COMMUNITY): Payer: 59

## 2013-06-06 ENCOUNTER — Encounter (HOSPITAL_COMMUNITY): Payer: 59

## 2013-06-08 ENCOUNTER — Encounter (HOSPITAL_COMMUNITY): Payer: 59

## 2013-06-10 ENCOUNTER — Encounter (HOSPITAL_COMMUNITY): Payer: 59

## 2013-06-13 ENCOUNTER — Encounter (HOSPITAL_COMMUNITY): Payer: 59

## 2013-06-14 ENCOUNTER — Encounter: Payer: Self-pay | Admitting: Cardiology

## 2013-06-14 ENCOUNTER — Ambulatory Visit (INDEPENDENT_AMBULATORY_CARE_PROVIDER_SITE_OTHER): Payer: 59 | Admitting: Cardiology

## 2013-06-14 VITALS — BP 141/85 | HR 85 | Ht 69.0 in | Wt 215.1 lb

## 2013-06-14 DIAGNOSIS — R7303 Prediabetes: Secondary | ICD-10-CM

## 2013-06-14 DIAGNOSIS — R7309 Other abnormal glucose: Secondary | ICD-10-CM

## 2013-06-14 DIAGNOSIS — E785 Hyperlipidemia, unspecified: Secondary | ICD-10-CM

## 2013-06-14 DIAGNOSIS — I2510 Atherosclerotic heart disease of native coronary artery without angina pectoris: Secondary | ICD-10-CM

## 2013-06-14 NOTE — Progress Notes (Signed)
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 He underwent emergent stenting of the proximal to mid LAD with 2 drug-eluting stents. Ejection fraction at cath was 25-30%. Echocardiogram demonstrated an ejection fraction 35%.Subsequent evaluation in [place] showed an EF 47%. He was concerned enough about his cardiac status that he underwent repeat cardiac cath on Feb. 6, 2015. This showed nonobstructive CAD with patent stents. EF was 50-55% with apical wall motion abnormality. He admits that he hasn't been exercising much. He still has symptoms of anxiety and some depression. He hasn't been following his diet as well and gained 15 lbs.  Current Outpatient Prescriptions on File Prior to Visit  Medication Sig Dispense Refill  . ALPRAZolam (XANAX) 0.25 MG tablet Take 0.25 mg by mouth at bedtime as needed for sleep.      Marland Kitchen aspirin 81 MG chewable tablet Chew 81 mg by mouth daily.      Marland Kitchen atorvastatin (LIPITOR) 10 MG tablet Take 10 mg by mouth daily.      . carvedilol (COREG) 6.25 MG tablet Take 6.25 mg by mouth 2 (two) times daily.      . citalopram (CELEXA) 20 MG tablet Take 20 mg by mouth daily.      . enalapril (VASOTEC) 2.5 MG tablet Take 2.5 mg by mouth 2 (two) times daily.      . fluticasone (FLONASE) 50 MCG/ACT nasal spray Place 2 sprays into both nostrils 2 (two) times daily.      . nitroGLYCERIN (NITROSTAT) 0.4 MG SL tablet Place 0.4 mg under the tongue every 5 (five) minutes as needed for chest pain. For chest pain      . Ticagrelor (BRILINTA) 90 MG TABS tablet Take 90 mg by mouth 2 (two) times daily.       No current facility-administered medications on file prior to visit.    Allergies  Allergen Reactions  . Morphine And Related Nausea And Vomiting  . Sulfa Antibiotics     Unknown reaction    Past Medical History   Diagnosis Date  . Borderline diabetes   . GERD (gastroesophageal reflux disease)   . Thyroid nodule   . Acute MI anterior wall subsequent episode care   . Dyslipidemia   . Anxiety   . CAD (coronary artery disease)     History reviewed. No pertinent past surgical history.  History  Smoking status  . Former Smoker  . Types: Cigars  Smokeless tobacco  . Not on file    History  Alcohol Use  . 4.2 oz/week  . 7 Glasses of wine per week    Comment: one glass of wine per day    Family History  Problem Relation Age of Onset  . Hypertension Mother   . Stroke Mother   . COPD Father   . Diabetes Father   . Hypertension Father   . Congestive Heart Failure Father     Review of Systems: As noted in history of present illness. His energy level is good.  All other systems were reviewed and are negative.  Physical Exam: BP 141/85  Pulse 85  Ht 5\' 9"  (1.753 m)  Wt 215 lb 1.9 oz (97.578 kg)  BMI 31.75 kg/m2 He is a pleasant male in no acute distress. HEENT: Unremarkable. No jugular venous distention or bruits. Lungs: Clear Cardiovascular: Regular rate and rhythm, normal S1-S2, soft grade  1/6 systolic ejection murmur at the left sternal border. Abdomen: Soft and nontender. Extremities: No cyanosis or edema. Pulses 2+. Neuro: Alert and oriented x3. Cranial nerves II through XII are intact.  LABORATORY DATA: Ecg shows NSR with old septal infarct.   Assessment / Plan: 1. Anterior STEMI complicated by ventricular fibrillation arrest. Status post DES x2 to the proximal and mid LAD. Clinically he is doing well. Repeat cardiac cath recently showed nonobstructive disease and EF has improved.  We will continue his current medications as listed. I'll followup again in 6 months.   He needs to increase his aerobic activity and lose weight.  2. Ischemic cardiomyopathy. Initial Ejection fraction 25-35%. Now improved to 50-55%. Continue current doses of ACE inhibitor and carvedilol.  3.  Mild dyslipidemia. Continue statin therapy. Dietary modification  4. Anxiety/depression. He is on Celexa and when necessary Xanax. I recommended he discuss this further with his primary care.

## 2013-06-14 NOTE — Patient Instructions (Signed)
Continue your current therapy  I will see you in 6 months.   

## 2013-10-03 ENCOUNTER — Telehealth: Payer: Self-pay | Admitting: Cardiology

## 2013-10-03 NOTE — Telephone Encounter (Signed)
New Message  Pt called states that his heart is racing (racing since 09/29/2013)... Pt reports he has completed an EKG on himself that came back normal. Please call back to discuss symptoms.

## 2013-10-03 NOTE — Telephone Encounter (Signed)
Left message for pt to call.

## 2013-10-05 NOTE — Telephone Encounter (Signed)
Returned call to patient he stated last Thursday he had a episode of fast heart beat.Stated EKG was done revealed sinus rhythm with rate 130 bpm.Stated he had recently started Victoza and that is a side effect.Stated he stopped Victoza and increased coreg to 12.5 mg twice a day.Stated heart beat is normal with rate in the 70's.Stated he is feeling good,wanted Dr.Jordan to know.Appointment moved up with Dr.Jordan to 11/11/13 at 4:45 pm.Advised will let Dr.Jordan know.Advised to call sooner if needed.

## 2013-10-05 NOTE — Telephone Encounter (Signed)
[facility]

## 2013-11-11 ENCOUNTER — Ambulatory Visit (INDEPENDENT_AMBULATORY_CARE_PROVIDER_SITE_OTHER): Payer: 59 | Admitting: Cardiology

## 2013-11-11 ENCOUNTER — Ambulatory Visit: Payer: 59 | Admitting: Cardiology

## 2013-11-11 ENCOUNTER — Encounter: Payer: Self-pay | Admitting: Cardiology

## 2013-11-11 VITALS — BP 124/82 | HR 72 | Ht 69.0 in | Wt 219.5 lb

## 2013-11-11 DIAGNOSIS — E785 Hyperlipidemia, unspecified: Secondary | ICD-10-CM

## 2013-11-11 DIAGNOSIS — R7309 Other abnormal glucose: Secondary | ICD-10-CM

## 2013-11-11 DIAGNOSIS — R7303 Prediabetes: Secondary | ICD-10-CM

## 2013-11-11 DIAGNOSIS — I2510 Atherosclerotic heart disease of native coronary artery without angina pectoris: Secondary | ICD-10-CM

## 2013-11-11 DIAGNOSIS — I252 Old myocardial infarction: Secondary | ICD-10-CM | POA: Insufficient documentation

## 2013-11-11 MED ORDER — CARVEDILOL 12.5 MG PO TABS: 12.5000 mg | ORAL_TABLET | Freq: Two times a day (BID) | ORAL | Status: DC

## 2013-11-11 MED ORDER — ENALAPRIL MALEATE 2.5 MG PO TABS: 2.5000 mg | ORAL_TABLET | Freq: Two times a day (BID) | ORAL | Status: DC

## 2013-11-11 MED ORDER — ATORVASTATIN CALCIUM 10 MG PO TABS: 10.0000 mg | ORAL_TABLET | Freq: Every day | ORAL | Status: DC

## 2013-11-11 MED ORDER — TICAGRELOR 90 MG PO TABS: 90.0000 mg | ORAL_TABLET | Freq: Two times a day (BID) | ORAL | Status: DC

## 2013-11-11 NOTE — Progress Notes (Signed)
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 He underwent emergent stenting of the proximal to mid LAD with 2 long drug-eluting stents. Ejection fraction at cath was 25-30%. Echocardiogram demonstrated an ejection fraction 35%.Subsequent evaluation in [place] showed an EF 47%. He was concerned enough about his cardiac status that he underwent repeat cardiac cath on Feb. 6, 2015. This showed nonobstructive CAD with patent stents. EF was 50-55% with apical wall motion abnormality. He admits that he hasn't been exercising much. He is working hard. One month ago he ate at [employer]. About 20 minutes later he noted his HR racing. Ecg showed sinus tachycardia with rate 120 bpm. HR then slowed. He increased Coreg to 12.5 mg bid. No further tachycardia.  He does complain of intermittent mid sternal chest pain that he is sure is GERD. Does not want to take Protonix. Is scheduled for panendoscopy in 2 weeks. He is having a difficult time losing weight and is considering medication to help with weight loss.  Current Outpatient Prescriptions on File Prior to Visit  Medication Sig Dispense Refill  . aspirin 81 MG chewable tablet Chew 81 mg by mouth daily.      . citalopram (CELEXA) 20 MG tablet Take 20 mg by mouth daily.       No current facility-administered medications on file prior to visit.    Allergies  Allergen Reactions  . Morphine And Related Nausea And Vomiting  . Sulfa Antibiotics     Unknown reaction    Past Medical History  Diagnosis Date  . Borderline diabetes   . GERD (gastroesophageal reflux disease)   . Thyroid nodule   . Acute MI anterior wall subsequent episode care   . Dyslipidemia   . Anxiety   . CAD (coronary artery disease)     History reviewed. No pertinent past surgical history.  History  Smoking status   . Former Smoker  . Types: Cigars  Smokeless tobacco  . Not on file    History  Alcohol Use  . 4.2 oz/week  . 7 Glasses of wine per week    Comment: one glass of wine per day    Family History  Problem Relation Age of Onset  . Hypertension Mother   . Stroke Mother   . COPD Father   . Diabetes Father   . Hypertension Father   . Congestive Heart Failure Father     Review of Systems: As noted in history of present illness. His energy level is good.  All other systems were reviewed and are negative.  Physical Exam: BP 124/82  Pulse 72  Ht  (1.753 m)  Wt 219 lb 8 oz (99.565 kg)  BMI 32.40 kg/m2 He is a pleasant male in no acute distress. HEENT: Unremarkable. No jugular venous distention or bruits. Lungs: Clear Cardiovascular: Regular rate and rhythm, normal S1-S2, soft grade 1/6 systolic ejection murmur at the left sternal border. Abdomen: Soft and nontender. Extremities: No cyanosis or edema. Pulses 2+. Neuro: Alert and oriented x3. Cranial nerves II through XII are intact.  LABORATORY DATA: Ecg: 09/29/13 showed sinus tachy with old anterior MI. occ PVC.  Labs: 09/29/13: glucose 153. A1c 5.3%. ALT 52. All other chemistries are normal. CBC and TFTs normal. Chol- 107, Trig-100, HDL 48, LDL 39.  Assessment / Plan: 1. Anterior STEMI complicated by ventricular fibrillation arrest. Status  post DES x2 to the proximal and mid LAD. Clinically he is doing well. Repeat cardiac cath  showed nonobstructive disease and EF has improved.  We discussed DAPT. Since he presented with acute STEMI and had extensive stenting of the LAD I would recommend continuing DAPT indefinitely. I would consider switching Brilinta to Plavix due to cost but he doesn't want to change now.   He needs to increase his aerobic activity and lose weight.  2. Ischemic cardiomyopathy. Initial Ejection fraction 25-35%. Now improved to 50-55%. Continue current doses of ACE inhibitor and carvedilol.  3. Mild  dyslipidemia. Continue statin therapy. Dietary modification  4. Sinus tachycardia. Agree with increase Coreg dose.  I will follow up in 6 months.

## 2013-11-11 NOTE — Patient Instructions (Signed)
Continue your current therapy  I will see you in 6 months.   

## 2013-11-14 ENCOUNTER — Encounter: Payer: Self-pay | Admitting: Endocrinology

## 2013-12-02 ENCOUNTER — Ambulatory Visit: Payer: Self-pay | Admitting: Gastroenterology

## 2013-12-09 ENCOUNTER — Ambulatory Visit: Payer: 59 | Admitting: Cardiology

## 2014-01-20 ENCOUNTER — Ambulatory Visit: Payer: Self-pay | Admitting: Internal Medicine

## 2014-01-24 ENCOUNTER — Telehealth: Payer: Self-pay | Admitting: Cardiology

## 2014-01-25 NOTE — Telephone Encounter (Signed)
Closed encounter °

## 2014-02-23 ENCOUNTER — Encounter (HOSPITAL_COMMUNITY): Payer: Self-pay | Admitting: Cardiology

## 2014-04-26 ENCOUNTER — Encounter: Payer: Self-pay | Admitting: Cardiology

## 2014-05-26 ENCOUNTER — Ambulatory Visit: Payer: 59 | Admitting: Cardiology

## 2014-06-09 ENCOUNTER — Ambulatory Visit: Payer: 59 | Admitting: Cardiology

## 2014-08-09 IMAGING — CR DG CHEST 1V PORT
1 series · 1 of 1 positions shown · non-contrast
Comparison: None.

CLINICAL DATA: STEMI

PORTABLE CHEST - 1 VIEW

[AP]
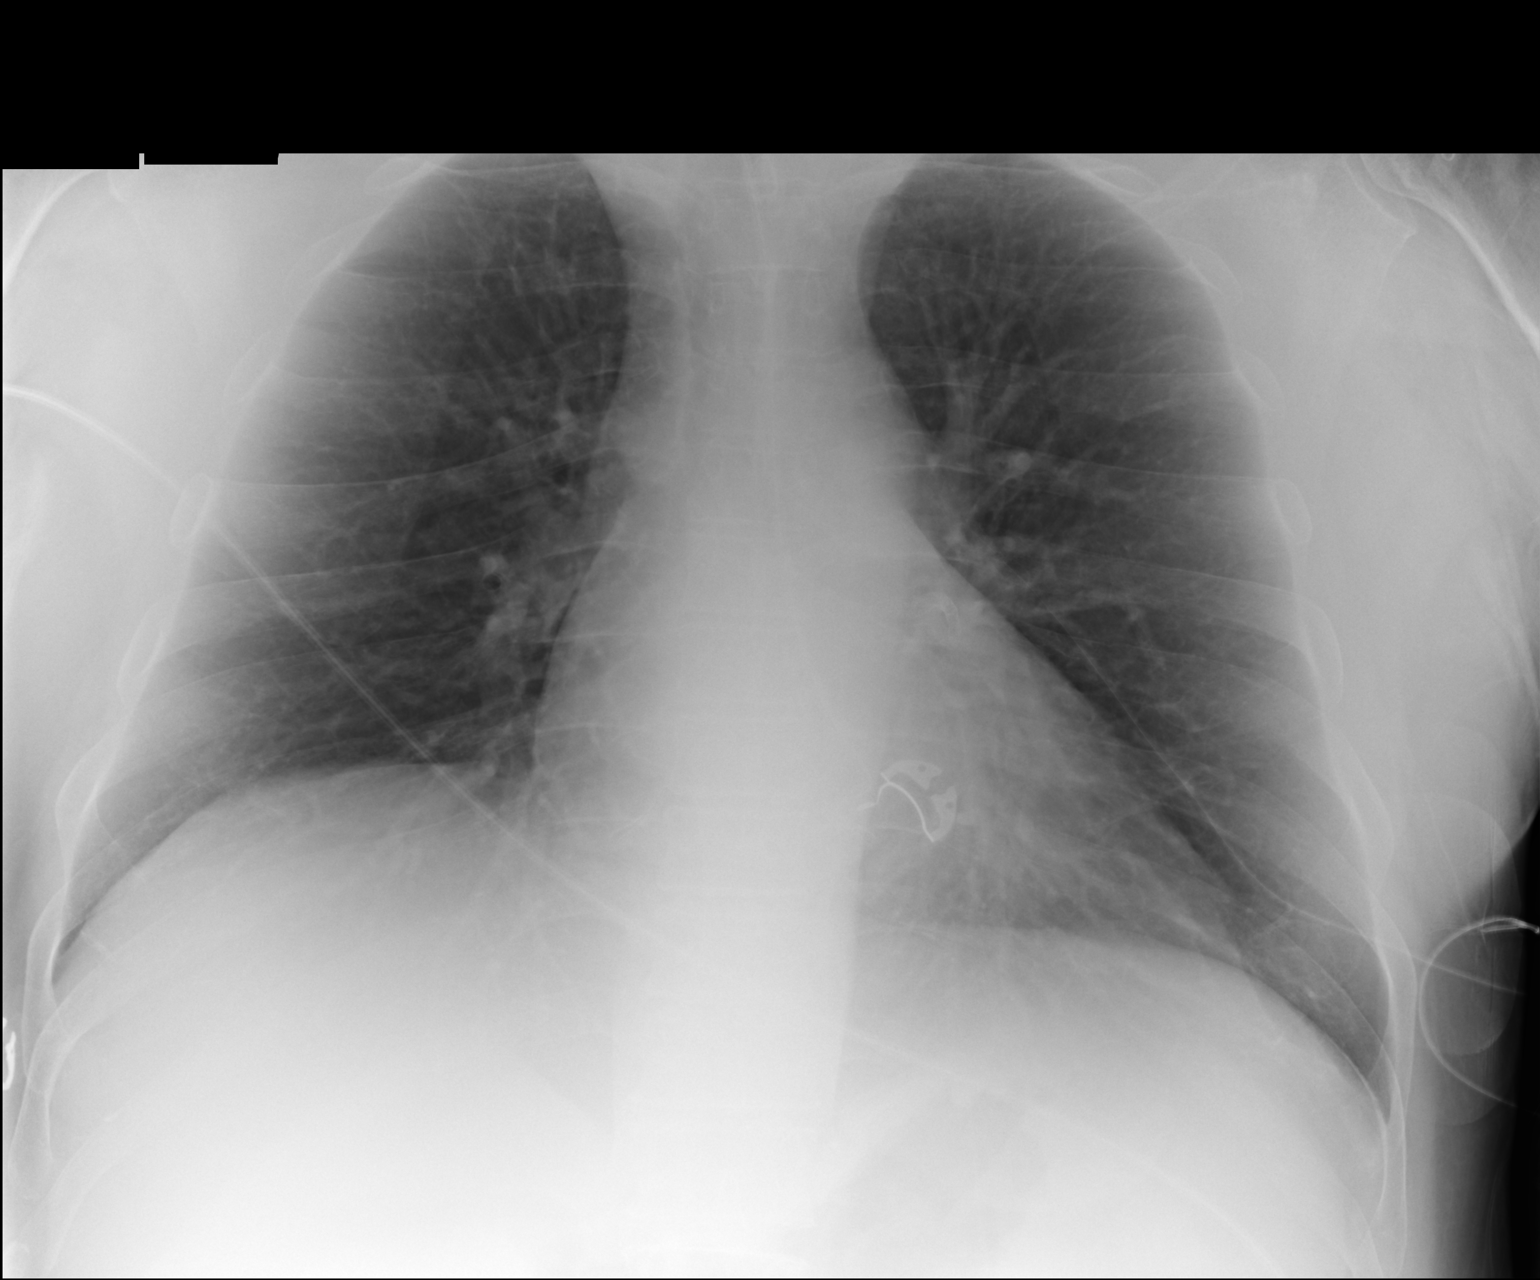

[1 of 1 positions shown; findings below may reference images not displayed]

FINDINGS: Borderline enlarged cardiac silhouette and mediastinal
contours, possibly to be due to decreased lung volumes and AP
projection.  Mild cephalization of flow without frank evidence of
edema.  Minimal perihilar atelectasis.  No focal airspace
opacities.  No definite pleural effusion or pneumothorax.
Unchanged bones.
IMPRESSION: Mild pulmonary venous congestion without frank evidence of edema.

## 2014-08-25 ENCOUNTER — Ambulatory Visit (INDEPENDENT_AMBULATORY_CARE_PROVIDER_SITE_OTHER): Payer: 59 | Admitting: Cardiology

## 2014-08-25 ENCOUNTER — Encounter: Payer: Self-pay | Admitting: Cardiology

## 2014-08-25 VITALS — BP 138/78 | HR 93 | Ht 68.0 in | Wt 218.0 lb

## 2014-08-25 DIAGNOSIS — E785 Hyperlipidemia, unspecified: Secondary | ICD-10-CM

## 2014-08-25 DIAGNOSIS — I2510 Atherosclerotic heart disease of native coronary artery without angina pectoris: Secondary | ICD-10-CM

## 2014-08-25 MED ORDER — TICAGRELOR 60 MG PO TABS: 60.0000 mg | ORAL_TABLET | Freq: Two times a day (BID) | ORAL | Status: DC

## 2014-08-25 NOTE — Patient Instructions (Signed)
Reduce your Brilinta to 60 mg twice a day  Continue your other therapy  I will see you in 6 months.

## 2014-08-25 NOTE — Progress Notes (Signed)
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 He underwent emergent stenting of the proximal to mid LAD with 2 long drug-eluting stents. Ejection fraction at cath was 25-30%. Echocardiogram demonstrated an ejection fraction 35%.Subsequent evaluation in [place] showed an EF 51% with small apical defect. He was concerned enough about his cardiac status that he underwent repeat cardiac cath on Feb. 6, 2015. This showed nonobstructive CAD with patent stents. EF was 50-55% with apical wall motion abnormality.  On follow up today he reports he recently travelled to [place] to attend his mother's funeral. He became ill there with fever, chills, diarrhea, and aching. This has improved. He tried to reduce coreg but his HR went over 100 so he increased it back to 12.5 mg bid. He did undergo EGD last year and was diagnosed with esophagitiis and gastritis. Symptoms resolved with omeprazole.   Current Outpatient Prescriptions on File Prior to Visit  Medication Sig Dispense Refill  . aspirin 81 MG chewable tablet Chew 81 mg by mouth daily.    Marland Kitchen atorvastatin (LIPITOR) 10 MG tablet Take 1 tablet (10 mg total) by mouth daily. 90 tablet 3  . carvedilol (COREG) 12.5 MG tablet Take 1 tablet (12.5 mg total) by mouth 2 (two) times daily. 180 tablet 3  . citalopram (CELEXA) 20 MG tablet Take 20 mg by mouth daily.    . enalapril (VASOTEC) 2.5 MG tablet Take 1 tablet (2.5 mg total) by mouth 2 (two) times daily. 180 tablet 3   No current facility-administered medications on file prior to visit.    Allergies  Allergen Reactions  . Morphine And Related Nausea And Vomiting  . Sulfa Antibiotics     Unknown reaction    Past Medical History  Diagnosis Date  . Borderline diabetes   . GERD (gastroesophageal reflux disease)   . Thyroid nodule   . Acute MI  anterior wall subsequent episode care   . Dyslipidemia   . Anxiety   . CAD (coronary artery disease)     Past Surgical History  Procedure Laterality Date  . Left heart catheterization with coronary angiogram N/A 04/22/2012    Procedure: LEFT HEART CATHETERIZATION WITH CORONARY ANGIOGRAM;  Surgeon: Patrick Boccio M [place], MD;  Location: [facility] CATH LAB;  Service: Cardiovascular;  Laterality: N/A;  . Left heart catheterization with coronary angiogram N/A 04/22/2013    Procedure: LEFT HEART CATHETERIZATION WITH CORONARY ANGIOGRAM;  Surgeon: Patrick Liendo M [place], MD;  Location: [facility] CATH LAB;  Service: Cardiovascular;  Laterality: N/A;    History  Smoking status  . Former Smoker  . Types: Cigars  Smokeless tobacco  . Not on file    History  Alcohol Use  . 4.2 oz/week  . 7 Glasses of wine per week    Comment: one glass of wine per day    Family History  Problem Relation Age of Onset  . Hypertension Mother   . Stroke Mother   . COPD Father   . Diabetes Father   . Hypertension Father   . Congestive Heart Failure Father     Review of Systems: As noted in history of present illness. His energy level is good.  All other systems were reviewed and are negative.  Physical Exam: BP 138/78 mmHg  Pulse 93  Ht 5\' 8"  (1.727 m)  Wt 98.884 kg (218 lb)  BMI 33.15 kg/m2 He is  a pleasant male in no acute distress. HEENT: Unremarkable. No jugular venous distention or bruits. Lungs: Clear Cardiovascular: Regular rate and rhythm, normal S1-S2, soft grade 1/6 systolic ejection murmur at the left sternal border. Abdomen: Soft and nontender. Extremities: No cyanosis or edema. Pulses 2+. Neuro: Alert and oriented x3. Cranial nerves II through XII are intact.  LABORATORY DATA:   Labs: 09/29/13: glucose 153. A1c 5.3%. ALT 52. All other chemistries are normal. CBC and TFTs normal. Chol- 107, Trig-100, HDL 48, LDL 39.  Assessment / Plan: 1. Anterior STEMI complicated by ventricular fibrillation arrest. Status  post DES x2 to the proximal and mid LAD. Clinically he is doing well. Repeat cardiac cath  showed nonobstructive disease and EF has improved.  We discussed DAPT. Since he presented with acute STEMI and had extensive stenting of the LAD I would recommend continuing DAPT indefinitely. We will reduce Brilinta to 60 mg bid.  He needs to increase his aerobic activity and lose weight.  2. Ischemic cardiomyopathy. Initial Ejection fraction 25-35%. Now improved to 50-55%. Continue current doses of ACE inhibitor and carvedilol.  3. Mild dyslipidemia. Continue statin therapy. Dietary modification. He is due for repeat fasting lab work. He states he will get it done.  4. Sinus tachycardia. Agree with increase Coreg dose.  I will follow up in 6 months.

## 2014-12-21 ENCOUNTER — Telehealth: Payer: Self-pay

## 2014-12-21 NOTE — Telephone Encounter (Signed)
Received a call from patient.He stated he injured right knee and will need orthoscopic surgery.Dr.Jordan advised ok to hold brilinta 5 days prior to surgery,but remain on aspirin.

## 2014-12-27 ENCOUNTER — Encounter
Admission: RE | Admit: 2014-12-27 | Discharge: 2014-12-27 | Disposition: A | Discharge status: Home / Self Care | Payer: 59 | Source: Ambulatory Visit | Attending: Orthopedic Surgery | Admitting: Orthopedic Surgery

## 2014-12-27 DIAGNOSIS — Z885 Allergy status to narcotic agent status: Secondary | ICD-10-CM | POA: Diagnosis not present

## 2014-12-27 DIAGNOSIS — Z79899 Other long term (current) drug therapy: Secondary | ICD-10-CM | POA: Diagnosis not present

## 2014-12-27 DIAGNOSIS — Z882 Allergy status to sulfonamides status: Secondary | ICD-10-CM | POA: Diagnosis not present

## 2014-12-27 DIAGNOSIS — Z8249 Family history of ischemic heart disease and other diseases of the circulatory system: Secondary | ICD-10-CM | POA: Diagnosis not present

## 2014-12-27 DIAGNOSIS — I2510 Atherosclerotic heart disease of native coronary artery without angina pectoris: Secondary | ICD-10-CM | POA: Diagnosis not present

## 2014-12-27 DIAGNOSIS — Z87891 Personal history of nicotine dependence: Secondary | ICD-10-CM | POA: Diagnosis not present

## 2014-12-27 DIAGNOSIS — M23221 Derangement of posterior horn of medial meniscus due to old tear or injury, right knee: Secondary | ICD-10-CM | POA: Diagnosis not present

## 2014-12-27 DIAGNOSIS — Z833 Family history of diabetes mellitus: Secondary | ICD-10-CM | POA: Diagnosis not present

## 2014-12-27 DIAGNOSIS — M25561 Pain in right knee: Secondary | ICD-10-CM | POA: Diagnosis present

## 2014-12-27 DIAGNOSIS — R7303 Prediabetes: Secondary | ICD-10-CM | POA: Diagnosis not present

## 2014-12-27 DIAGNOSIS — Z955 Presence of coronary angioplasty implant and graft: Secondary | ICD-10-CM | POA: Diagnosis not present

## 2014-12-27 HISTORY — DX: Type 2 diabetes mellitus without complications: E11.9

## 2014-12-27 NOTE — Patient Instructions (Addendum)
  Your procedure is scheduled on: 12/29/14 Fri  Report to Day Surgery.2nd floor medical mall To find out your arrival time please call [phone] between 1PM - 3PM on 12/28/14 Thurs  Remember: Instructions that are not followed completely may result in serious medical risk, up to and including death, or upon the discretion of your surgeon and anesthesiologist your surgery may need to be rescheduled.    _x___ 1. Do not eat food or drink liquids after midnight. No gum chewing or hard candies.     __x__ 2. No Alcohol for 24 hours before or after surgery.   ____ 3. Bring all medications with you on the day of surgery if instructed.    _x___ 4. Notify your doctor if there is any change in your medical condition     (cold, fever, infections).     Do not wear jewelry, make-up, hairpins, clips or nail polish.  Do not wear lotions, powders, or perfumes. You may wear deodorant.  Do not shave 48 hours prior to surgery. Men may shave face and neck.  Do not bring valuables to the hospital.    [facility] is not responsible for any belongings or valuables.               Contacts, dentures or bridgework may not be worn into surgery.  Leave your suitcase in the car. After surgery it may be brought to your room.  For patients admitted to the hospital, discharge time is determined by your                treatment team.   Patients discharged the day of surgery will not be allowed to drive home.   Please read over the following fact sheets that you were given:      _x___ Take these medicines the morning of surgery with A SIP OF WATER:    1.carvedilol (COREG) 12.5 MG tablet  2. citalopram (CELEXA) 20 MG tablet  3. enalapril (VASOTEC) 2.5 MG tablet  4.  5.  6.  ____ Fleet Enema (as directed)   _x___ Use CHG Soap as directed  ____ Use inhalers on the day of surgery  ____ Stop metformin 2 days prior to surgery    ____ Take 1/2 of usual insulin dose the night before surgery and none on the  morning of surgery.   _x___ Stop Coumadin/Plavix/aspirin on ticagrelor (BRILINTA) 60 MG TABS tablet per cardiologist  ____ Stop Anti-inflammatories on    ____ Stop supplements until after surgery.    ____ Bring C-Pap to the hospital.

## 2014-12-29 ENCOUNTER — Encounter: Payer: Self-pay | Admitting: *Deleted

## 2014-12-29 ENCOUNTER — Ambulatory Visit: Payer: 59 | Admitting: Anesthesiology

## 2014-12-29 ENCOUNTER — Ambulatory Visit
Admission: RE | Admit: 2014-12-29 | Discharge: 2014-12-29 | Disposition: A | Discharge status: Home / Self Care | Payer: 59 | Source: Ambulatory Visit | Attending: Orthopedic Surgery | Admitting: Orthopedic Surgery

## 2014-12-29 ENCOUNTER — Encounter
Admission: RE | Disposition: A | Discharge status: Home / Self Care | Payer: Self-pay | Source: Ambulatory Visit | Attending: Orthopedic Surgery

## 2014-12-29 DIAGNOSIS — Z885 Allergy status to narcotic agent status: Secondary | ICD-10-CM | POA: Insufficient documentation

## 2014-12-29 DIAGNOSIS — Z882 Allergy status to sulfonamides status: Secondary | ICD-10-CM | POA: Insufficient documentation

## 2014-12-29 DIAGNOSIS — Z79899 Other long term (current) drug therapy: Secondary | ICD-10-CM | POA: Insufficient documentation

## 2014-12-29 DIAGNOSIS — Z7902 Long term (current) use of antithrombotics/antiplatelets: Secondary | ICD-10-CM | POA: Insufficient documentation

## 2014-12-29 DIAGNOSIS — I252 Old myocardial infarction: Secondary | ICD-10-CM | POA: Insufficient documentation

## 2014-12-29 DIAGNOSIS — M23221 Derangement of posterior horn of medial meniscus due to old tear or injury, right knee: Secondary | ICD-10-CM | POA: Insufficient documentation

## 2014-12-29 DIAGNOSIS — I2510 Atherosclerotic heart disease of native coronary artery without angina pectoris: Secondary | ICD-10-CM | POA: Insufficient documentation

## 2014-12-29 DIAGNOSIS — Z87891 Personal history of nicotine dependence: Secondary | ICD-10-CM | POA: Insufficient documentation

## 2014-12-29 DIAGNOSIS — R7303 Prediabetes: Secondary | ICD-10-CM | POA: Insufficient documentation

## 2014-12-29 DIAGNOSIS — Z955 Presence of coronary angioplasty implant and graft: Secondary | ICD-10-CM | POA: Insufficient documentation

## 2014-12-29 DIAGNOSIS — Z8249 Family history of ischemic heart disease and other diseases of the circulatory system: Secondary | ICD-10-CM | POA: Insufficient documentation

## 2014-12-29 DIAGNOSIS — Z833 Family history of diabetes mellitus: Secondary | ICD-10-CM | POA: Insufficient documentation

## 2014-12-29 HISTORY — PX: KNEE ARTHROSCOPY: SHX127

## 2014-12-29 LAB — GLUCOSE, CAPILLARY
GLUCOSE-CAPILLARY: 114 mg/dL — AB (ref 65–99)
Glucose-Capillary: 101 mg/dL — ABNORMAL HIGH (ref 65–99)

## 2014-12-29 SURGERY — ARTHROSCOPY, KNEE
Anesthesia: General | Laterality: Right | Wound class: Clean

## 2014-12-29 MED ORDER — PROPOFOL 10 MG/ML IV BOLUS
INTRAVENOUS | Status: DC | PRN
  Administered 2014-12-29: 200 mg via INTRAVENOUS

## 2014-12-29 MED ORDER — ACETAMINOPHEN 10 MG/ML IV SOLN
INTRAVENOUS | Status: DC | PRN
  Administered 2014-12-29: 1000 mg via INTRAVENOUS

## 2014-12-29 MED ORDER — BUPIVACAINE-EPINEPHRINE (PF) 0.25% -1:200000 IJ SOLN
INTRAMUSCULAR | Status: DC | PRN
  Administered 2014-12-29: 5 mL via PERINEURAL
  Administered 2014-12-29: 25 mL via PERINEURAL

## 2014-12-29 MED ORDER — HYDROCODONE-ACETAMINOPHEN 5-325 MG PO TABS: 1.0000 | ORAL_TABLET | ORAL | Status: DC | PRN

## 2014-12-29 MED ORDER — FENTANYL CITRATE (PF) 100 MCG/2ML IJ SOLN: 25.0000 ug | INTRAMUSCULAR | Status: DC | PRN

## 2014-12-29 MED ORDER — ACETAMINOPHEN 10 MG/ML IV SOLN
INTRAVENOUS | Status: AC
  Filled 2014-12-29: qty 100

## 2014-12-29 MED ORDER — MORPHINE SULFATE (PF) 4 MG/ML IV SOLN
INTRAVENOUS | Status: DC | PRN
  Administered 2014-12-29: 4 mg

## 2014-12-29 MED ORDER — ONDANSETRON HCL 4 MG/2ML IJ SOLN: 4.0000 mg | Freq: Once | INTRAMUSCULAR | Status: DC | PRN

## 2014-12-29 MED ORDER — EPHEDRINE SULFATE 50 MG/ML IJ SOLN
INTRAMUSCULAR | Status: DC | PRN
  Administered 2014-12-29: 15 mg via INTRAVENOUS
  Administered 2014-12-29: 10 mg via INTRAVENOUS

## 2014-12-29 MED ORDER — MIDAZOLAM HCL 5 MG/5ML IJ SOLN
INTRAMUSCULAR | Status: DC | PRN
  Administered 2014-12-29: 2 mg via INTRAVENOUS

## 2014-12-29 MED ORDER — BUPIVACAINE-EPINEPHRINE (PF) 0.25% -1:200000 IJ SOLN
INTRAMUSCULAR | Status: AC
  Filled 2014-12-29: qty 30

## 2014-12-29 MED ORDER — FAMOTIDINE 20 MG PO TABS
20.0000 mg | ORAL_TABLET | Freq: Once | ORAL | Status: AC
  Administered 2014-12-29: 20 mg via ORAL

## 2014-12-29 MED ORDER — FENTANYL CITRATE (PF) 100 MCG/2ML IJ SOLN
INTRAMUSCULAR | Status: DC | PRN
  Administered 2014-12-29: 50 ug via INTRAVENOUS
  Administered 2014-12-29 (×2): 25 ug via INTRAVENOUS

## 2014-12-29 MED ORDER — FAMOTIDINE 20 MG PO TABS
ORAL_TABLET | ORAL | Status: AC
  Administered 2014-12-29: 20 mg via ORAL
  Filled 2014-12-29: qty 1

## 2014-12-29 MED ORDER — SODIUM CHLORIDE 0.9 % IV SOLN
INTRAVENOUS | Status: DC
  Administered 2014-12-29: 13:00:00 via INTRAVENOUS

## 2014-12-29 MED ORDER — LIDOCAINE HCL (CARDIAC) 20 MG/ML IV SOLN
INTRAVENOUS | Status: DC | PRN
  Administered 2014-12-29: 60 mg via INTRAVENOUS

## 2014-12-29 MED ORDER — ONDANSETRON HCL 4 MG/2ML IJ SOLN
INTRAMUSCULAR | Status: DC | PRN
  Administered 2014-12-29: 4 mg via INTRAVENOUS

## 2014-12-29 MED ORDER — HYDROCODONE-ACETAMINOPHEN 5-325 MG PO TABS
ORAL_TABLET | ORAL | Status: AC
  Administered 2014-12-29: 1 via ORAL
  Filled 2014-12-29: qty 1

## 2014-12-29 MED ORDER — MORPHINE SULFATE (PF) 4 MG/ML IV SOLN
INTRAVENOUS | Status: AC
  Filled 2014-12-29: qty 1

## 2014-12-29 MED ORDER — HYDROCODONE-ACETAMINOPHEN 5-325 MG PO TABS
1.0000 | ORAL_TABLET | ORAL | Status: DC | PRN
  Administered 2014-12-29: 1 via ORAL

## 2014-12-29 SURGICAL SUPPLY — 24 items
BLADE SHAVER 4.5 DBL SERAT CV (CUTTER) ×3 IMPLANT
BNDG COHESIVE 4X5 TAN STRL (GAUZE/BANDAGES/DRESSINGS) ×3 IMPLANT
BNDG ESMARK 6X12 TAN STRL LF (GAUZE/BANDAGES/DRESSINGS) IMPLANT
DRSG DERMACEA 8X12 NADH (GAUZE/BANDAGES/DRESSINGS) ×3 IMPLANT
DURAPREP 26ML APPLICATOR (WOUND CARE) ×3 IMPLANT
GAUZE SPONGE 4X4 12PLY STRL (GAUZE/BANDAGES/DRESSINGS) ×3 IMPLANT
GLOVE BIOGEL M STRL SZ7.5 (GLOVE) ×9 IMPLANT
GLOVE INDICATOR 8.0 STRL GRN (GLOVE) ×6 IMPLANT
GOWN STRL REUS W/ TWL LRG LVL3 (GOWN DISPOSABLE) ×1 IMPLANT
GOWN STRL REUS W/ TWL LRG LVL4 (GOWN DISPOSABLE) ×1 IMPLANT
GOWN STRL REUS W/TWL LRG LVL3 (GOWN DISPOSABLE) ×2
GOWN STRL REUS W/TWL LRG LVL4 (GOWN DISPOSABLE) ×2
IV LACTATED RINGER IRRG 3000ML (IV SOLUTION) ×8
IV LR IRRIG 3000ML ARTHROMATIC (IV SOLUTION) ×4 IMPLANT
MANIFOLD NEPTUNE II (INSTRUMENTS) ×3 IMPLANT
PACK ARTHROSCOPY KNEE (MISCELLANEOUS) ×3 IMPLANT
SET TUBE SUCT SHAVER OUTFL 24K (TUBING) ×3 IMPLANT
SET TUBE TIP INTRA-ARTICULAR (MISCELLANEOUS) ×3 IMPLANT
STRAP SAFETY BODY (MISCELLANEOUS) ×3 IMPLANT
SUT ETHILON 3-0 FS-10 30 BLK (SUTURE) ×3
SUTURE EHLN 3-0 FS-10 30 BLK (SUTURE) ×1 IMPLANT
TUBING ARTHRO INFLOW-ONLY STRL (TUBING) ×3 IMPLANT
WAND HAND CNTRL MULTIVAC 50 (MISCELLANEOUS) ×3 IMPLANT
WRAP KNEE W/COLD PACKS 25.5X14 (SOFTGOODS) ×3 IMPLANT

## 2014-12-29 NOTE — Anesthesia Procedure Notes (Signed)
Procedure Name: LMA Insertion Date/Time: 12/29/2014 2:12 PM Performed by: Chong SicilianLOPEZ, Tarris Delbene Pre-anesthesia Checklist: Patient identified, Emergency Drugs available, Suction available, Patient being monitored and Timeout performed Patient Re-evaluated:Patient Re-evaluated prior to inductionOxygen Delivery Method: Circle system utilized and Simple face mask Preoxygenation: Pre-oxygenation with 100% oxygen Intubation Type: IV induction Ventilation: Mask ventilation without difficulty LMA Size: 4.5 Number of attempts: 1 Placement Confirmation: breath sounds checked- equal and bilateral and positive ETCO2 Tube secured with: Tape

## 2014-12-29 NOTE — Op Note (Signed)
OPERATIVE NOTE  DATE OF SURGERY:  12/29/2014  PATIENT NAME:  Patrick Gordon   DOB: [birth year]  MRN: [id number]   PRE-OPERATIVE DIAGNOSIS:  Internal derangement of the right knee   POST-OPERATIVE DIAGNOSIS:   Tear of the posterior horn of the medial meniscus, right knee  PROCEDURE:  Right knee arthroscopy, partial medial meniscectomy  SURGEON:  Jena GaussJames P August Longest, Jr., M.D.   ASSISTANT: none  ANESTHESIA: general  ESTIMATED BLOOD LOSS: Minimal  FLUIDS REPLACED: 800 mL of crystalloid  TOURNIQUET TIME: Not used   DRAINS: none  IMPLANTS UTILIZED: None  INDICATIONS FOR SURGERY: Patrick MulderShamil J Sangiovanni is a 64 y.o. year old male who has been seen for complaints of right knee pain. MRI demonstrated findings consistent with meniscal pathology. After discussion of the risks and benefits of surgical intervention, the patient expressed understanding of the risks benefits and agree with plans for right knee arthroscopy.   PROCEDURE IN DETAIL: The patient was brought into the operating room and, after adequate general anesthesia was achieved, a tourniquet was applied to the right thigh and the leg was placed in the leg holder. All bony prominences were well padded. The patient's right knee was cleaned and prepped with alcohol and Duraprep and draped in the usual sterile fashion. A "timeout" was performed as per usual protocol. The anticipated portal sites were injected with 0.25% Marcaine with epinephrine. An anterolateral incision was made and a cannula was inserted. A small effusion was evacuated and the knee was distended with fluid using the pump. The scope was advanced down the medial gutter into the medial compartment. Under visualization with the scope, an anteromedial portal was created and a hooked probe was inserted. The medial meniscus was visualized and probed. A radial tear was noted to the posterior horn of the medial meniscus with degenerative changes extending to either side of the radial  tear. The tear was debrided using meniscal punches and a 4.5 mm incisor shaver. Final contouring was performed using a 50 ArthroCare wand. The remaining rim of meniscus was probed and felt to be stable. The articular cartilage was visualized and felt to be in excellent condition.  The scope was then advanced into the intercondylar notch. The anterior cruciate ligament was visualized and probed and felt to be intact. The scope was then advanced into the lateral compartment. The lateral meniscus was visualized and probed. The lateral meniscus was stable without evidence of a tear. The articular cartilage of the lateral compartment was visualized and felt to be in excellent condition. Finally, the scope was advanced so as to visualize the patellofemoral articulation. Good patellar tracking was appreciated. The articular cartilage was in excellent condition.  The knee was irrigated with copius amounts of fluid and suctioned dry. The anterolateral portal was re-approximated with #3-0 nylon. A combination of 0.25% Marcaine with epinephrine and 4 mg of Morphine were injected via the scope. The scope was removed and the anteromedial portal was re-approximated with #3-0 nylon. A sterile dressing was applied followed by application of an ice wrap.  The patient tolerated the procedure well and was transported to the PACU in stable condition.  Lamiyah Schlotter P. Angie FavaHooten, Jr., M.D.

## 2014-12-29 NOTE — Transfer of Care (Signed)
Immediate Anesthesia Transfer of Care Note  Patient: Patrick Gordon  Procedure(s) Performed: Procedure(s): ARTHROSCOPY KNEE partial medial menisectomy (Right)  Patient Location: PACU  Anesthesia Type:General  Level of Consciousness: sedated  Airway & Oxygen Therapy: Patient Spontanous Breathing and Patient connected to face mask oxygen  Post-op Assessment: Report given to RN and Post -op Vital signs reviewed and stable  Post vital signs: Reviewed and stable  Last Vitals: 1524: 143/85 98% 72hr 12resp 98.7 temp Filed Vitals:   12/29/14 1251  BP: 146/79  Pulse: 80  Temp: 36.9 C  Resp: 16    Complications: No apparent anesthesia complications

## 2014-12-29 NOTE — Anesthesia Preprocedure Evaluation (Addendum)
Anesthesia Evaluation  Patient identified by MRN, date of birth, ID band Patient awake    Reviewed: Allergy & Precautions, NPO status , Patient's Chart, lab work & pertinent test results  Airway Mallampati: II  TM Distance: >3 FB     Dental  (+) Chipped   Pulmonary former smoker,    Pulmonary exam normal breath sounds clear to auscultation       Cardiovascular + CAD and + Past MI  Normal cardiovascular exam     Neuro/Psych Anxiety negative neurological ROS     GI/Hepatic Neg liver ROS, GERD  Medicated and Controlled,  Endo/Other  diabetes, Well Controlled  Renal/GU negative Renal ROS     Musculoskeletal negative musculoskeletal ROS (+)   Abdominal Normal abdominal exam  (+)   Peds negative pediatric ROS (+)  Hematology negative hematology ROS (+)   Anesthesia Other Findings   Reproductive/Obstetrics                            Anesthesia Physical Anesthesia Plan  ASA: III  Anesthesia Plan: General   Post-op Pain Management:    Induction: Intravenous  Airway Management Planned: LMA  Additional Equipment:   Intra-op Plan:   Post-operative Plan: Extubation in OR  Informed Consent: I have reviewed the patients History and Physical, chart, labs and discussed the procedure including the risks, benefits and alternatives for the proposed anesthesia with the patient or authorized representative who has indicated his/her understanding and acceptance.   Dental advisory given  Plan Discussed with: CRNA and Surgeon  Anesthesia Plan Comments:        Anesthesia Quick Evaluation

## 2014-12-29 NOTE — Discharge Instructions (Signed)
°  Instructions after Knee Arthroscopy  ° ° James P. Hooten, Jr., M.D.    ° Dept. of Orthopaedics & Sports Medicine ° [facility] ° [address] ° [place],   [address] ° ° Phone: [phone]   Fax: [phone] ° ° °DIET: °• Drink plenty of non-alcoholic fluids & begin a light diet. °• Resume your normal diet the day after surgery. ° °ACTIVITY:  °• You may use crutches or a walker with weight-bearing as tolerated, unless instructed otherwise. °• You may wean yourself off of the walker or crutches as tolerated.  °• Begin doing gentle exercises. Exercising will reduce the pain and swelling, increase motion, and prevent muscle weakness.   °• Avoid strenuous activities or athletics for a minimum of 4-6 weeks after arthroscopic surgery. °• Do not drive or operate any equipment until instructed. ° °WOUND CARE:  °• Place one to two pillows under the knee the first day or two when sitting or lying.  °• Continue to use the ice packs periodically to reduce pain and swelling. °• The small incisions in your knee are closed with nylon stitches. The stitches will be removed in the office. °• The bulky dressing may be removed on the second day after surgery. DO NOT TOUCH THE STITCHES. Put a Band-Aid over each stitch. Do NOT use any ointments or creams on the incisions.  °• You may bathe or shower after the stitches are removed at the first office visit following surgery. ° °MEDICATIONS: °• You may resume your regular medications. °• Please take the pain medication as prescribed. °• Do not take pain medication on an empty stomach. °• Do not drive or drink alcoholic beverages when taking pain medications. ° °CALL THE OFFICE FOR: °• Temperature above 101 degrees °• Excessive bleeding or drainage on the dressing. °• Excessive swelling, coldness, or paleness of the toes. °• Persistent nausea and vomiting. ° °FOLLOW-UP:  °You should have an appointment to return to the office in 7-10 days after surgery.  AMBULATORY  SURGERY  °DISCHARGE INSTRUCTIONS ° ° °The drugs that you were given will stay in your system until tomorrow so for the next 24 hours you should not: ° °Drive an automobile °Make any legal decisions °Drink any alcoholic beverage ° ° °You may resume regular meals tomorrow.  Today it is better to start with liquids and gradually work up to solid foods. ° °You may eat anything you prefer, but it is better to start with liquids, then soup and crackers, and gradually work up to solid foods. ° ° °Please notify your doctor immediately if you have any unusual bleeding, trouble breathing, redness and pain at the surgery site, drainage, fever, or pain not relieved by medication. ° ° ° °Additional Instructions: ° ° ° ° ° ° ° °• Please contact your physician with any problems or Same Day Surgery at [phone], Monday through Friday 6 am to 4 pm, or [place] [facility] number at [phone]. °

## 2014-12-29 NOTE — H&P (Signed)
The patient has been re-examined, and the chart reviewed, and there have been no interval changes to the documented history and physical.    The risks, benefits, and alternatives have been discussed at length. The patient expressed understanding of the risks benefits and agreed with plans for surgical intervention.  James P. Hooten, Jr. M.D.    

## 2014-12-29 NOTE — Anesthesia Postprocedure Evaluation (Signed)
  Anesthesia Post-op Note  Patient: Patrick Gordon  Procedure(s) Performed: Procedure(s): ARTHROSCOPY KNEE partial medial menisectomy (Right)  Anesthesia type:General  Patient location: PACU  Post pain: Pain level controlled  Post assessment: Post-op Vital signs reviewed, Patient's Cardiovascular Status Stable, Respiratory Function Stable, Patent Airway and No signs of Nausea or vomiting  Post vital signs: Reviewed and stable  Last Vitals:  Filed Vitals:   12/29/14 1616  BP: 167/88  Pulse: 75  Temp: 36 C  Resp: 18    Level of consciousness: awake, alert  and patient cooperative  Complications: No apparent anesthesia complications

## 2014-12-29 NOTE — Brief Op Note (Signed)
12/29/2014  3:31 PM  PATIENT:  Patrick Gordon  64 y.o. male  PRE-OPERATIVE DIAGNOSIS:  INTERNAL DERANGEMENT OF RIGHT KNEE  POST-OPERATIVE DIAGNOSIS:  Tear of the posterior horn of the medial meniscus, right knee  PROCEDURE:  Procedure(s): ARTHROSCOPY KNEE partial medial menisectomy (Right)  SURGEON:  Surgeon(s) and Role:    * Donato HeinzJames P Garan Frappier, MD - Primary  ASSISTANTS: none   ANESTHESIA:   general  EBL:  Total I/O In: 800 [I.V.:800] Out: -  minimal  BLOOD ADMINISTERED:none  DRAINS: none   LOCAL MEDICATIONS USED:  MARCAINE     SPECIMEN:  No Specimen  DISPOSITION OF SPECIMEN:  N/A  COUNTS:  YES  TOURNIQUET:  Not used  DICTATION: .Dragon Dictation  PLAN OF CARE: Admit to inpatient   PATIENT DISPOSITION:  PACU - hemodynamically stable.   Delay start of Pharmacological VTE agent (>24hrs) due to surgical blood loss or risk of bleeding: not applicable

## 2015-01-01 ENCOUNTER — Encounter: Payer: Self-pay | Admitting: Orthopedic Surgery

## 2015-03-06 ENCOUNTER — Other Ambulatory Visit: Payer: Self-pay | Admitting: Orthopedic Surgery

## 2015-03-06 ENCOUNTER — Ambulatory Visit
Admission: RE | Admit: 2015-03-06 | Discharge: 2015-03-06 | Disposition: A | Discharge status: Home / Self Care | Payer: 59 | Source: Ambulatory Visit | Attending: Orthopedic Surgery | Admitting: Orthopedic Surgery

## 2015-03-06 DIAGNOSIS — M79661 Pain in right lower leg: Secondary | ICD-10-CM

## 2015-03-07 ENCOUNTER — Ambulatory Visit: Admission: RE | Admit: 2015-03-07 | Payer: 59 | Source: Ambulatory Visit

## 2015-03-23 ENCOUNTER — Ambulatory Visit (INDEPENDENT_AMBULATORY_CARE_PROVIDER_SITE_OTHER): Payer: 59 | Admitting: Cardiology

## 2015-03-23 ENCOUNTER — Encounter: Payer: Self-pay | Admitting: Cardiology

## 2015-03-23 VITALS — BP 142/78 | HR 67 | Ht 69.0 in | Wt 228.0 lb

## 2015-03-23 DIAGNOSIS — I2510 Atherosclerotic heart disease of native coronary artery without angina pectoris: Secondary | ICD-10-CM

## 2015-03-23 DIAGNOSIS — E785 Hyperlipidemia, unspecified: Secondary | ICD-10-CM | POA: Diagnosis not present

## 2015-03-23 DIAGNOSIS — I252 Old myocardial infarction: Secondary | ICD-10-CM | POA: Diagnosis not present

## 2015-03-23 MED ORDER — ATORVASTATIN CALCIUM 10 MG PO TABS: 10.0000 mg | ORAL_TABLET | Freq: Every day | ORAL | Status: AC

## 2015-03-23 MED ORDER — ENALAPRIL MALEATE 2.5 MG PO TABS: 2.5000 mg | ORAL_TABLET | Freq: Two times a day (BID) | ORAL | Status: AC

## 2015-03-23 MED ORDER — TICAGRELOR 60 MG PO TABS: 60.0000 mg | ORAL_TABLET | Freq: Two times a day (BID) | ORAL | Status: AC

## 2015-03-23 MED ORDER — CARVEDILOL 12.5 MG PO TABS: 12.5000 mg | ORAL_TABLET | Freq: Two times a day (BID) | ORAL | Status: AC

## 2015-03-23 NOTE — Patient Instructions (Signed)
Continue your current therapy  Try and lose weight and increase activity as your knee allows.

## 2015-03-23 NOTE — Addendum Note (Signed)
Addended by: Meda KlinefelterPUGH, Cayle Thunder JOHNSON D on: 03/23/2015 02:53 PM   Modules accepted: Orders

## 2015-03-23 NOTE — Progress Notes (Signed)
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 He underwent emergent stenting of the proximal to mid LAD with 2 long drug-eluting stents. Ejection fraction at cath was 25-30%. Echocardiogram demonstrated an ejection fraction 35%.Subsequent evaluation in [place] showed an EF 51% with small apical defect. He was concerned enough about his cardiac status that he underwent repeat cardiac cath on Feb. 6, 2015. This showed nonobstructive CAD with patent stents. EF was 50-55% with apical wall motion abnormality.  On follow up today he is doing very well. He had arthroscopic right knee surgery in October. He later developed some swelling and tenosynovitis. Doppler negative for DVT. Now slowly advancing activity. No chest pain or dyspnea. With inactivity he has gained about 9 lbs.  Current Outpatient Prescriptions on File Prior to Visit  Medication Sig Dispense Refill  . aspirin 81 MG chewable tablet Chew 81 mg by mouth daily.    Marland Kitchen atorvastatin (LIPITOR) 10 MG tablet Take 1 tablet (10 mg total) by mouth daily. 90 tablet 3  . carvedilol (COREG) 12.5 MG tablet Take 1 tablet (12.5 mg total) by mouth 2 (two) times daily. 180 tablet 3  . citalopram (CELEXA) 20 MG tablet Take 20 mg by mouth daily.    . enalapril (VASOTEC) 2.5 MG tablet Take 1 tablet (2.5 mg total) by mouth 2 (two) times daily. 180 tablet 3  . ticagrelor (BRILINTA) 60 MG TABS tablet Take 1 tablet (60 mg total) by mouth 2 (two) times daily. 180 tablet 3   No current facility-administered medications on file prior to visit.    Allergies  Allergen Reactions  . Morphine And Related Nausea And Vomiting  . Sulfa Antibiotics     Unknown reaction  . Other Rash    Past Medical History  Diagnosis Date  . Borderline diabetes   . GERD (gastroesophageal reflux disease)   . Thyroid  nodule   . Acute MI anterior wall subsequent episode care (HCC)   . Dyslipidemia   . Anxiety   . CAD (coronary artery disease)   . Diabetes mellitus without complication (HCC)     boraderline    Past Surgical History  Procedure Laterality Date  . Left heart catheterization with coronary angiogram N/A 04/22/2012    Procedure: LEFT HEART CATHETERIZATION WITH CORONARY ANGIOGRAM;  Surgeon: Makale Pindell M Swaziland, MD;  Location: [facility] CATH LAB;  Service: Cardiovascular;  Laterality: N/A;  . Left heart catheterization with coronary angiogram N/A 04/22/2013    Procedure: LEFT HEART CATHETERIZATION WITH CORONARY ANGIOGRAM;  Surgeon: Alvina Strother M [place]az[place], MD;  Location: [facility] CATH LAB;  Service: Cardiovascular;  Laterality: N/A;  . Mandible osteotomy    . Knee arthroscopy Right 12/29/2014    Procedure: ARTHROSCOPY KNEE partial medial menisectomy;  Surgeon: [name], MD;  Location: [facility] ORS;  Service: Orthopedics;  Laterality: Right;    History  Smoking status  . Former Smoker  . Types: Cigars  . Quit date: 12/26/2012  Smokeless tobacco  . Not on file    History  Alcohol Use  . 4.2 oz/week  . 7 Glasses of wine per week    Comment: one glass of wine per day    Family History  Problem Relation Age of Onset  . Hypertension Mother   . Stroke Mother   . COPD Father   . Diabetes Father   . Hypertension Father   .  Congestive Heart Failure Father     Review of Systems: As noted in history of present illness. His energy level is good.  All other systems were reviewed and are negative.  Physical Exam: BP 142/78 mmHg  Pulse 67  Ht 5\' 9"  (1.753 m)  Wt 103.42 kg (228 lb)  BMI 33.65 kg/m2 He is a pleasant male in no acute distress. HEENT: Unremarkable. No jugular venous distention or bruits. Lungs: Clear Cardiovascular: Regular rate and rhythm, normal S1-S2, soft grade 1/6 systolic ejection murmur at the left sternal border. Abdomen: Soft and nontender. Extremities: No cyanosis or edema. Pulses  2+. Neuro: Alert and oriented x3. Cranial nerves II through XII are intact.  LABORATORY DATA: Reports last A1c 5.6%. Cholesterol always low.   Ecg today shows septal Q waves, otherwise normal. I have personally reviewed and interpreted this study.   Assessment / Plan: 1. Anterior STEMI complicated by ventricular fibrillation arrest. Status post DES x2 to the proximal and mid LAD in February 2014. Clinically he is doing well. Repeat cardiac cath one year later showed nonobstructive disease and EF has improved.  Now 3 years out and doing very well. Continue DAPT with reduced dose Brilinta indefinitely. Follow up in 6-8 months.   2. Ischemic cardiomyopathy. Initial Ejection fraction 25-35%. Now improved to 50-55%. Continue current doses of ACE inhibitor and carvedilol.  3. Mild dyslipidemia. Continue statin therapy. Dietary modification. Increase activity as tolerated and lose weight.

## 2015-07-03 DIAGNOSIS — I10 Essential (primary) hypertension: Secondary | ICD-10-CM | POA: Diagnosis not present

## 2015-07-03 DIAGNOSIS — Z125 Encounter for screening for malignant neoplasm of prostate: Secondary | ICD-10-CM | POA: Diagnosis not present

## 2015-07-03 DIAGNOSIS — I209 Angina pectoris, unspecified: Secondary | ICD-10-CM | POA: Diagnosis not present

## 2015-07-06 DIAGNOSIS — Z0000 Encounter for general adult medical examination without abnormal findings: Secondary | ICD-10-CM | POA: Diagnosis not present

## 2015-10-18 NOTE — Progress Notes (Signed)
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 He underwent emergent stenting of the proximal to mid LAD with 2 long drug-eluting stents. Ejection fraction at cath was 25-30%. Echocardiogram demonstrated an ejection fraction 35%.Subsequent evaluation in [employer] showed an EF 51% with small apical defect. He was concerned enough about his cardiac status that he underwent repeat cardiac cath on Feb. 6, 2015. This showed nonobstructive CAD with patent stents. EF was 50-55% with apical wall motion abnormality.  On follow up today he is doing very well. He still has some limitation with his right knee.   No chest pain or dyspnea. He has lost 4 lbs. Is still doing well with reducing his work stress.  Current Outpatient Prescriptions on File Prior to Visit  Medication Sig Dispense Refill  . aspirin 81 MG chewable tablet Chew 81 mg by mouth daily.    Marland Kitchen atorvastatin (LIPITOR) 10 MG tablet Take 1 tablet (10 mg total) by mouth daily. 90 tablet 3  . carvedilol (COREG) 12.5 MG tablet Take 1 tablet (12.5 mg total) by mouth 2 (two) times daily. 180 tablet 3  . enalapril (VASOTEC) 2.5 MG tablet Take 1 tablet (2.5 mg total) by mouth 2 (two) times daily. 180 tablet 3  . ticagrelor (BRILINTA) 60 MG TABS tablet Take 1 tablet (60 mg total) by mouth 2 (two) times daily. 180 tablet 3   No current facility-administered medications on file prior to visit.     Allergies  Allergen Reactions  . Morphine And Related Nausea And Vomiting  . Sulfa Antibiotics     Unknown reaction  . Other Rash    Past Medical History:  Diagnosis Date  . Acute MI anterior wall subsequent episode care (HCC)   . Anxiety   . Borderline diabetes   . CAD (coronary artery disease)   . Diabetes mellitus without complication (HCC)    boraderline  . Dyslipidemia   . GERD  (gastroesophageal reflux disease)   . Thyroid nodule     Past Surgical History:  Procedure Laterality Date  . KNEE ARTHROSCOPY Right 12/29/2014   Procedure: ARTHROSCOPY KNEE partial medial menisectomy;  Surgeon: Patrick Heinz, MD;  Location: [facility] ORS;  Service: Orthopedics;  Laterality: Right;  . LEFT HEART CATHETERIZATION WITH CORONARY ANGIOGRAM N/A 04/22/2012   Procedure: LEFT HEART CATHETERIZATION WITH CORONARY ANGIOGRAM;  Surgeon: Patrick Linker M Swaziland, MD;  Location: [facility] CATH LAB;  Service: Cardiovascular;  Laterality: N/A;  . LEFT HEART CATHETERIZATION WITH CORONARY ANGIOGRAM N/A 04/22/2013   Procedure: LEFT HEART CATHETERIZATION WITH CORONARY ANGIOGRAM;  Surgeon: Patrick Nicasio M [place]az[place]and, MD;  Location: [facility] CATH LAB;  Service: Cardiovascular;  Laterality: N/A;  . MANDIBLE OSTEOTOMY      History  Smoking Status  . Former Smoker  . Types: Cigars  . Quit date: 12/26/2012  Smokeless Tobacco  . Never Used    History  Alcohol Use  . 4.2 oz/week  . 7 Glasses of wine per week    Comment: one glass of wine per day    Family History  Problem Relation Age of Onset  . Hypertension Mother   . Stroke Mother   . COPD Father   . Diabetes Father   . Hypertension Father   . Congestive Heart Failure Father     Review of Systems: As noted in history of present illness. His energy level is good.  All other systems were reviewed and are negative.  Physical Exam: BP 124/72   Pulse 84   Ht  (1.753 m)   Wt 224 lb 12.8 oz (102 kg)   BMI 33.20 kg/m  He is a pleasant male in no acute distress. HEENT: Unremarkable. No jugular venous distention or bruits. Lungs: Clear Cardiovascular: Regular rate and rhythm, normal S1-S2, soft grade 1/6 systolic ejection murmur at the left sternal border. Abdomen: Soft and nontender. Extremities: No cyanosis or edema. Pulses 2+. Neuro: Alert and oriented x3. Cranial nerves II through XII are intact.  LABORATORY DATA: Labs reviewed from 07/03/15: Normal CMET  cholesterol 97, trig 81, HDL 43, LDL 38. CBC normal. A1c 6.3%. CRP level normal.    Assessment / Plan: 1. Anterior STEMI complicated by ventricular fibrillation arrest. Status post DES x2 to the proximal and mid LAD in February 2014. Clinically he is doing well. Repeat cardiac cath one year later showed nonobstructive disease and EF has improved.  Now 3.5 years out and doing very well. Continue DAPT with reduced dose Brilinta indefinitely. Follow up in 6 months.   2. Ischemic cardiomyopathy. Initial Ejection fraction 25-35%. Now improved to 50-55%. Continue current doses of ACE inhibitor and carvedilol.  3. Mild dyslipidemia. Continue statin therapy. Dietary modification. Focus on weight loss with mildly elevated A1c.

## 2015-10-19 ENCOUNTER — Encounter: Payer: Self-pay | Admitting: Cardiology

## 2015-10-19 ENCOUNTER — Ambulatory Visit (INDEPENDENT_AMBULATORY_CARE_PROVIDER_SITE_OTHER): Payer: BLUE CROSS/BLUE SHIELD | Admitting: Cardiology

## 2015-10-19 VITALS — BP 124/72 | HR 84 | Ht 69.0 in | Wt 224.8 lb

## 2015-10-19 DIAGNOSIS — I252 Old myocardial infarction: Secondary | ICD-10-CM | POA: Diagnosis not present

## 2015-10-19 DIAGNOSIS — E785 Hyperlipidemia, unspecified: Secondary | ICD-10-CM

## 2015-10-19 NOTE — Patient Instructions (Signed)
Continue your current therapy  I will see you in 6 months.   

## 2016-05-02 ENCOUNTER — Inpatient Hospital Stay: Admission: RE | Admit: 2016-05-02 | Payer: BLUE CROSS/BLUE SHIELD | Source: Ambulatory Visit

## 2016-05-02 ENCOUNTER — Encounter
Admission: RE | Admit: 2016-05-02 | Discharge: 2016-05-02 | Disposition: A | Discharge status: Home / Self Care | Payer: BLUE CROSS/BLUE SHIELD | Source: Ambulatory Visit | Attending: Orthopedic Surgery | Admitting: Orthopedic Surgery

## 2016-05-02 HISTORY — DX: Cardiac arrhythmia, unspecified: I49.9

## 2016-05-02 NOTE — Pre-Procedure Instructions (Signed)
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 He underwent emergent stenting of the proximal to mid LAD with 2 long drug-eluting stents. Ejection fraction at cath was 25-30%. Echocardiogram demonstrated an ejection fraction 35%.Subsequent evaluation in [place] showed an EF 51% with small apical defect. He was concerned enough about his cardiac status that he underwent repeat cardiac cath on Feb. 6, 2015. This showed nonobstructive CAD with patent stents. EF was 50-55% with apical wall motion abnormality.  On follow up today he is doing very well. He still has some limitation with his right knee.   No chest pain or dyspnea. He has lost 4 lbs. Is still doing well with reducing his work stress.        Current Outpatient Prescriptions on File Prior to Visit  Medication Sig Dispense Refill  . aspirin 81 MG chewable tablet Chew 81 mg by mouth daily.    Marland Kitchen. atorvastatin (LIPITOR) 10 MG tablet Take 1 tablet (10 mg total) by mouth daily. 90 tablet 3  . carvedilol (COREG) 12.5 MG tablet Take 1 tablet (12.5 mg total) by mouth 2 (two) times daily. 180 tablet 3  . enalapril (VASOTEC) 2.5 MG tablet Take 1 tablet (2.5 mg total) by mouth 2 (two) times daily. 180 tablet 3  . ticagrelor (BRILINTA) 60 MG TABS tablet Take 1 tablet (60 mg total) by mouth 2 (two) times daily. 180 tablet 3   No current facility-administered medications on file prior to visit.           Allergies  Allergen Reactions  . Morphine And Related Nausea And Vomiting  . Sulfa Antibiotics     Unknown reaction  . Other Rash        Past Medical History:  Diagnosis Date  . Acute MI anterior wall subsequent episode care (HCC)   . Anxiety   . Borderline diabetes   . CAD (coronary artery disease)   . Diabetes mellitus without complication (HCC)    boraderline  . Dyslipidemia   . GERD (gastroesophageal reflux disease)   . Thyroid nodule          Past Surgical History:  Procedure Laterality Date  . KNEE ARTHROSCOPY Right 12/29/2014   Procedure: ARTHROSCOPY KNEE partial medial menisectomy;  Surgeon: Donato HeinzJames P Hooten, MD;  Location: [facility] ORS;  Service: Orthopedics;  Laterality: Right;  . LEFT HEART CATHETERIZATION WITH CORONARY ANGIOGRAM N/A 04/22/2012   Procedure: LEFT HEART CATHETERIZATION WITH CORONARY ANGIOGRAM;  Surgeon: Peter M SwazilandJordan, MD;  [facility] LAB;  Service: Cardiovascular;  Laterality: N/A;  . LEFT HEART CATHETERIZATION WITH CORONARY ANGIOGRAM N/A 04/22/2013   Procedure: LEFT HEART CATHETERIZATION WITH CORONARY ANGIOGRAM;  Surgeon: Peter M SwazilandJordan, MD;  [facility];  Service: Cardiovascular;  Laterality: N/A;  . MANDIBLE OSTEOTOMY          History  Smoking Status  . Former Smoker  . Types: Cigars  . Quit date: 12/26/2012  Smokeless Tobacco  . Never Used        History  Alcohol Use  . 4.2 oz/week  . 7 Glasses of wine per week    Comment: one glass of wine per day         Family History  Problem Relation Age of Onset  . Hypertension Mother   . Stroke Mother   . COPD Father   . Diabetes Father   . Hypertension Father   . Congestive Heart Failure Father     Review of Systems: As noted in history of present illness. His energy level is good.  All other systems were reviewed and are negative.  Physical Exam: BP 124/72   Pulse 84   Ht 5\' 9"  (1.753 m)   Wt 224 lb 12.8 oz (102 kg)   BMI 33.20  kg/m  He is a pleasant male in no acute distress. HEENT: Unremarkable. No jugular venous distention or bruits. Lungs: Clear Cardiovascular: Regular rate and rhythm, normal S1-S2, soft grade 1/6 systolic ejection murmur at the left sternal border. Abdomen: Soft and nontender. Extremities: No cyanosis or edema. Pulses 2+. Neuro: Alert and oriented x3. Cranial nerves II through XII are intact.  LABORATORY DATA: Labs reviewed from 07/03/15: Normal CMET cholesterol 97, trig 81, HDL 43, LDL 38. CBC normal. A1c 6.3%. CRP level normal.    Assessment / Plan: 1. Anterior STEMI complicated by ventricular fibrillation arrest. Status post DES x2 to the proximal and mid LAD in February 2014. Clinically he is doing well. Repeat cardiac cath one year later showed nonobstructive disease and EF has improved.  Now 3.5 years out and doing very well. Continue DAPT with reduced dose Brilinta indefinitely. Follow up in 6 months.   2. Ischemic cardiomyopathy. Initial Ejection fraction 25-35%. Now improved to 50-55%. Continue current doses of ACE inhibitor and carvedilol.  3. Mild dyslipidemia. Continue statin therapy. Dietary modification. Focus on weight loss with mildly elevated A1c.       Referring Provider   [name], MD      Diagnoses    Codes Comments  Old MI (myocardial infarction) - Primary ICD-9-CM: 412 ICD-10-CM: I25.2   Dyslipidemia  ICD-9-CM: 272.4 ICD-10-CM: E78.5        Reason for Visit   Coronary Artery Disease   Reason for Visit History    Discontinued Medications    Reason for Discontinue  citalopram (CELEXA) 20 MG tablet Patient has not taken in last 30 days  LORazepam (ATIVAN) 1 MG tablet Patient has not taken in last 30 days  Level of Service   Level of Service  PR OFFICE OUTPATIENT VISIT 15 MINUTES [99213]   Follow-up and Disposition   Return in about 6 months (around 04/20/2016) for OV.  Follow-up and Disposition History    All Charges for This  Encounter   Code Description Service Date Service Provider Modifiers Qty  [phone] PR OFFICE OUTPATIENT VISIT 15 MINUTES 10/19/2015 [name], MD  1  AVS Reports   Date/Time Report Action User  10/19/2015 3:22 Gordon After Visit Summary Printed Charna Elizabeth, LPN  Patient Instructions   Continue your current therapy  I will see you in 6 months    Routing History   Recipient Method Sent by Date Sent  [name], MD Fax [name], MD 10/19/2015  Fax: [phone] Phone: [phone]   Previous Visit   Date &  Time 03/23/2015 2:30 Gordon Provider [name], MD Department [facility] [place] Encounter # [id number]  Medical Necessity Transport Form   Medical Necessity  Khiree, Bukhari [phone] y.o. M)

## 2016-05-02 NOTE — Pre-Procedure Instructions (Signed)
Patrick MulderShamil Gordon Righter  2D Echocardiogram with contrast  Order# [id number]  Ordering physician: Darrol Jumphonda G Barrett, PA-C Study date: 04/23/12  Study Result   Result status: Final result  *Lares*        *[facility]*           [address]          [place], [place] [address]            [phone]  ------------------------------------------------------------ Transthoracic Echocardiography  Patient:  Patrick Gordon, Patrick Gordon MR #:    [id number] Study Date: 04/23/2012 Gender:   M Age:    66 Height:   172.7cm Weight:   93.9kg BSA:    2.621m^2 Pt. Status: Room:    [facility]  ADMITTING  SwazilandJordan, Peter ATTENDING  SwazilandJordan, Peter PERFORMING  [place] ORDER[facility]   Barrett, Rhonda SONOGRAPHER Georgian CoLauren Williams, RDCS, CCT cc:  ------------------------------------------------------------ LV EF: 35%  ------------------------------------------------------------ Indications:   MI - acute 410.91.  ------------------------------------------------------------ Study Conclusions  - Left ventricle: The cavity size was normal. Wall thickness was increased in a pattern of mild LVH. The estimated ejection fraction was 35%. The apex, mid to apical anteroseptal wall, apical anterior wall, and apical inferior wall were akinetic. The mid anterior wall was hypokinetic. Doppler parameters are consistent with abnormal left ventricular relaxation (grade 1 diastolic dysfunction). - Aortic valve: Trileaflet; moderately calcified leaflets. Sclerosis without stenosis. - Mitral valve: No significant regurgitation. - Left atrium: The atrium was mildly dilated. - Right ventricle: The cavity size was normal. Systolic function was normal. - Pulmonary arteries: No complete TR doppler jet so unable to estimate PA systolic pressure. - Systemic veins: IVC measured 2.2 cm with some respirophasic  variation, suggesting RA pressure 10 mmHg. Impressions:  - Normal LV size with mild LVH. EF 35% with wall motion abnormalities as described above. Normal RV size and systoilc function. Aortic valve sclerosis without significant stenosis. Transthoracic echocardiography. M-mode, complete 2D, spectral Doppler, and color Doppler. Height: Height: 172.7cm. Height: 68in. Weight: Weight: 93.9kg. Weight: 206.6lb. Body mass index: BMI: 31.5kg/m^2. Body surface area:  BSA: 2.391m^2. Blood pressure:   102/73. Patient status: Inpatient. Location: ICU/CCU  ------------------------------------------------------------  ------------------------------------------------------------ Left ventricle: The cavity size was normal. Wall thickness was increased in a pattern of mild LVH. The estimated ejection fraction was 35%. The apex, mid to apical anteroseptal wall, apical anterior wall, and apical inferior wall were akinetic. The mid anterior wall was hypokinetic. Doppler parameters are consistent with abnormal left ventricular relaxation (grade 1 diastolic dysfunction).  ------------------------------------------------------------ Aortic valve:  Trileaflet; moderately calcified leaflets. Sclerosis without stenosis. Doppler:  No regurgitation.  ------------------------------------------------------------ Aorta: Aortic root: The aortic root was normal in size. Ascending aorta: The ascending aorta was normal in size.  ------------------------------------------------------------ Mitral valve:  Doppler:  There was no evidence for stenosis.  No significant regurgitation.  ------------------------------------------------------------ Left atrium: The atrium was mildly dilated.  ------------------------------------------------------------ Right ventricle: The cavity size was normal. Systolic function was  normal.  ------------------------------------------------------------ Pulmonic valve:  Structurally normal valve.  Cusp separation was normal. Doppler: Transvalvular velocity was within the normal range. No regurgitation.  ------------------------------------------------------------ Tricuspid valve:  Doppler:  No significant regurgitation.  ------------------------------------------------------------ Pulmonary artery:  No complete TR doppler jet so unable to estimate PA systolic pressure.  ------------------------------------------------------------ Right atrium: The atrium was normal in size.  ------------------------------------------------------------ Pericardium: There was no pericardial effusion.  ------------------------------------------------------------ Systemic veins: IVC measured 2.2 cm with some respirophasic variation, suggesting RA pressure 10 mmHg.  ------------------------------------------------------------  2D measurements  Normal Doppler        Normal Left ventricle         measurements LVID ED,  42.3 mm   43-52  Left ventricle chord,             Ea, lat   6.91 cm/ ------- PLAX              ann, tiss     s LVID ES,  28.9 mm   23-38  DP chord,             E/Ea, lat  9.78   ------- PLAX              ann, tiss FS, chord,  32 %   >29   DP PLAX              Ea, med   6.25 cm/ ------- LVPW, ED  10.8 mm   ------ ann, tiss     s IVS/LVPW   1    <1.3  DP ratio, ED           E/Ea, med  10.82   ------- Vol ED,   101 ml   ------ ann, tiss MOD1              DP Vol ES,   60 ml   ------ Mitral valve MOD1              Peak E vel  67.6 cm/ ------- EF, MOD1   41 %   ------          s Vol index,  49 ml/m^2 ------ Peak A vel  82.4 cm/ ------- ED, MOD1                      s Vol index,  29 ml/m^2 ------ Deceleratio  176 ms 150-230 ES, MOD1            n time Vol ED,   115 ml   ------ Peak E/A   0.8   ------- MOD2              ratio Vol ES,   71 ml   ------ Systemic veins MOD2              Estimated    5 mm ------- EF, MOD2   38 %   ------ CVP        Hg Stroke    44 ml   ------ Right ventricle vol, MOD2           Sa vel, lat 18.8 cm/ ------- Vol index,  56 ml/m^2 ------ ann, tiss     s ED, MOD2            DP Vol index,  34 ml/m^2 ------ ES, MOD2 Stroke   21.3 ml/m^2 ------ index, MOD2 Ventricular septum IVS, ED  10.8 mm   ------ Aorta Root diam,  29 mm   ------ ED Left atrium AP dim   1.84 cm/m^2 <2.2 index Vol, S    57 ml   ------ Vol index, 27.5 ml/m^2 ------ S Right ventricle RVID ED,  23.6 mm   19-38 PLAX  ------------------------------------------------------------ Prepared and Electronically Authenticated by  [name] 2014-02-07T11:47:[id number]  Patient Information   Patient Name Patrick Gordon, Patrick Gordon Sex Male DOB 20-Nov-1950 SSN [id number]  Reason For Exam  Priority: Routine  Not on file  Surgical History   Surgical History   No past medical history on file.  Other Surgical History   Procedure Laterality Date Comment Source  KNEE ARTHROSCOPY Right 12/29/2014 Procedure: ARTHROSCOPY KNEE partial medial menisectomy; Surgeon: [name], MD; Location: [facility] ORS; Service: Orthopedics; Laterality: Right; Provider  LEFT HEART CATHETERIZATION WITH CORONARY ANGIOGRAM N/A 04/22/2012 Procedure: LEFT HEART CATHETERIZATION WITH CORONARY ANGIOGRAM; Surgeon: [name], MD; Location[facility] CATH LAB; Service: Cardiovascular; Laterality: N/A; Provider  LEFT HEART CATHETERIZATION WITH CORONARY ANGIOGRAM N/A 04/22/2013 Procedure: LEFT HEART CATHETERIZATION WITH CORONARY  ANGIOGRAM; Surgeon: [name], MD; Location[facility] C[facility] LAB; Service: Cardiovascular; Laterality: N/A; Provider  MANDIBLE OSTEOTOMY    Provider    Performing Technologist/Nurse   Performing Technologist/Nurse:   Implants     No active implants to display in this view.  Order-Level Documents - 04/22/2012:   Scan on 04/27/2012 9:26 AM by Provider Default, MD  Scan on 04/27/2012 6:37 AM by Provider Default, MD      Encounter-Level Documents - 04/22/2012:   Scan on 05/11/2012 10:14 AM by Provider Default, MD  Scan on 04/27/2012 9:27 AM by Provider Default, MD  Scan on 04/27/2012 9:26 AM by Provider Default, MD  Scan on 04/27/2012 9:25 AM by Provider Default, MD  Scan on 04/22/2012 7:04 PM by Provider Default, MD      Printable Result Report   Result Report

## 2016-05-02 NOTE — Patient Instructions (Signed)
  Your procedure is scheduled on: 05-09-16 Report to Same Day Surgery 2nd floor medical mall [facility](Medical Mall Entrance-take elevator on left to 2nd floor.  Check in with surgery information desk.) To find out your arrival time please call [phone] between 1PM - 3PM on 05-08-16  Remember: Instructions that are not followed completely may result in serious medical risk, up to and including death, or upon the discretion of your surgeon and anesthesiologist your surgery may need to be rescheduled.    _x___ 1. Do not eat food or drink liquids after midnight. No gum chewing or hard candies.     __x__ 2. No Alcohol for 24 hours before or after surgery.   __x__3. No Smoking for 24 prior to surgery.   ____  4. Bring all medications with you on the day of surgery if instructed.    __x__ 5. Notify your doctor if there is any change in your medical condition     (cold, fever, infections).     Do not wear jewelry, make-up, hairpins, clips or nail polish.  Do not wear lotions, powders, or perfumes. You may wear deodorant.  Do not shave 48 hours prior to surgery. Men may shave face and neck.  Do not bring valuables to the hospital.    [facility] is not responsible for any belongings or valuables.               Contacts, dentures or bridgework may not be worn into surgery.  Leave your suitcase in the car. After surgery it may be brought to your room.  For patients admitted to the hospital, discharge time is determined by your treatment team.   Patients discharged the day of surgery will not be allowed to drive home.  You will need someone to drive you home and stay with you the night of your procedure.    Please read over the following fact sheets that you were given:   [facility] Preparing for Surgery and or MRSA Information   _x___ Take these medicines the morning of surgery with A SIP OF WATER:    1. VASOTEC  2. COREG  3. LIPITOR  4.  5.  6.  ____Fleets enema or Magnesium Citrate as  directed.   ____ Use CHG Soap or sage wipes as directed on instruction sheet   ____ Use inhalers on the day of surgery and bring to hospital day of surgery  ____ Stop metformin 2 days prior to surgery    ____ Take 1/2 of usual insulin dose the night before surgery and none on the morning of surgery.   _X___ Stop Aspirin, Coumadin, Pllavix ,Eliquis, Effient, or Pradaxa-PT STATES HE IS STOPPING BRILINTA AND ASA 3 DAYS PRIOR TO SURGERY  LIKE HE DID HIS LAST KNEE SCOPE IN 2016  x__ Stop Anti-inflammatories such as Advil, Aleve, Ibuprofen, Motrin, Naproxen,          Naprosyn, Goodies powders or aspirin products NOW-Ok to take Tylenol.   ____ Stop supplements until after surgery.    ____ Bring C-Pap to the hospital.

## 2016-05-09 ENCOUNTER — Encounter: Payer: Self-pay | Admitting: Orthopedic Surgery

## 2016-05-09 ENCOUNTER — Encounter
Admission: RE | Disposition: A | Discharge status: Home / Self Care | Payer: Self-pay | Source: Ambulatory Visit | Attending: Orthopedic Surgery

## 2016-05-09 ENCOUNTER — Ambulatory Visit: Payer: BLUE CROSS/BLUE SHIELD | Admitting: Anesthesiology

## 2016-05-09 ENCOUNTER — Ambulatory Visit
Admission: RE | Admit: 2016-05-09 | Discharge: 2016-05-09 | Disposition: A | Discharge status: Home / Self Care | Payer: BLUE CROSS/BLUE SHIELD | Source: Ambulatory Visit | Attending: Orthopedic Surgery | Admitting: Orthopedic Surgery

## 2016-05-09 DIAGNOSIS — F419 Anxiety disorder, unspecified: Secondary | ICD-10-CM | POA: Diagnosis not present

## 2016-05-09 DIAGNOSIS — X509XXA Other and unspecified overexertion or strenuous movements or postures, initial encounter: Secondary | ICD-10-CM | POA: Diagnosis not present

## 2016-05-09 DIAGNOSIS — Z882 Allergy status to sulfonamides status: Secondary | ICD-10-CM | POA: Diagnosis not present

## 2016-05-09 DIAGNOSIS — E119 Type 2 diabetes mellitus without complications: Secondary | ICD-10-CM | POA: Insufficient documentation

## 2016-05-09 DIAGNOSIS — M25462 Effusion, left knee: Secondary | ICD-10-CM | POA: Insufficient documentation

## 2016-05-09 DIAGNOSIS — K219 Gastro-esophageal reflux disease without esophagitis: Secondary | ICD-10-CM | POA: Insufficient documentation

## 2016-05-09 DIAGNOSIS — Z885 Allergy status to narcotic agent status: Secondary | ICD-10-CM | POA: Insufficient documentation

## 2016-05-09 DIAGNOSIS — S83242A Other tear of medial meniscus, current injury, left knee, initial encounter: Secondary | ICD-10-CM | POA: Insufficient documentation

## 2016-05-09 DIAGNOSIS — Z7982 Long term (current) use of aspirin: Secondary | ICD-10-CM | POA: Diagnosis not present

## 2016-05-09 DIAGNOSIS — Z7984 Long term (current) use of oral hypoglycemic drugs: Secondary | ICD-10-CM | POA: Insufficient documentation

## 2016-05-09 DIAGNOSIS — Z87891 Personal history of nicotine dependence: Secondary | ICD-10-CM | POA: Insufficient documentation

## 2016-05-09 DIAGNOSIS — I2510 Atherosclerotic heart disease of native coronary artery without angina pectoris: Secondary | ICD-10-CM | POA: Diagnosis not present

## 2016-05-09 DIAGNOSIS — I252 Old myocardial infarction: Secondary | ICD-10-CM | POA: Insufficient documentation

## 2016-05-09 DIAGNOSIS — Z955 Presence of coronary angioplasty implant and graft: Secondary | ICD-10-CM | POA: Diagnosis not present

## 2016-05-09 HISTORY — PX: KNEE ARTHROSCOPY: SHX127

## 2016-05-09 LAB — GLUCOSE, CAPILLARY: Glucose-Capillary: 141 mg/dL — ABNORMAL HIGH (ref 65–99)

## 2016-05-09 SURGERY — ARTHROSCOPY, KNEE
Anesthesia: General | Site: Knee | Laterality: Left | Wound class: Clean

## 2016-05-09 MED ORDER — FAMOTIDINE 20 MG PO TABS
20.0000 mg | ORAL_TABLET | Freq: Once | ORAL | Status: AC
  Administered 2016-05-09: 20 mg via ORAL

## 2016-05-09 MED ORDER — MORPHINE SULFATE (PF) 4 MG/ML IV SOLN
INTRAVENOUS | Status: DC | PRN
  Administered 2016-05-09: 4 mg

## 2016-05-09 MED ORDER — METOCLOPRAMIDE HCL 10 MG PO TABS: 5.0000 mg | ORAL_TABLET | Freq: Three times a day (TID) | ORAL | Status: DC | PRN

## 2016-05-09 MED ORDER — OXYCODONE HCL 5 MG/5ML PO SOLN: 5.0000 mg | Freq: Once | ORAL | Status: DC | PRN

## 2016-05-09 MED ORDER — FAMOTIDINE 20 MG PO TABS
ORAL_TABLET | ORAL | Status: AC
  Administered 2016-05-09: 20 mg via ORAL
  Filled 2016-05-09: qty 1

## 2016-05-09 MED ORDER — HYDROCODONE-ACETAMINOPHEN 5-325 MG PO TABS: 1.0000 | ORAL_TABLET | ORAL | Status: DC | PRN

## 2016-05-09 MED ORDER — LIDOCAINE HCL (CARDIAC) 20 MG/ML IV SOLN
INTRAVENOUS | Status: DC | PRN
  Administered 2016-05-09: 100 mg via INTRAVENOUS

## 2016-05-09 MED ORDER — ONDANSETRON HCL 4 MG PO TABS: 4.0000 mg | ORAL_TABLET | Freq: Four times a day (QID) | ORAL | Status: DC | PRN

## 2016-05-09 MED ORDER — BUPIVACAINE-EPINEPHRINE (PF) 0.25% -1:200000 IJ SOLN
INTRAMUSCULAR | Status: AC
  Filled 2016-05-09: qty 30

## 2016-05-09 MED ORDER — MORPHINE SULFATE (PF) 4 MG/ML IV SOLN
INTRAVENOUS | Status: AC
  Filled 2016-05-09: qty 1

## 2016-05-09 MED ORDER — ESMOLOL HCL 100 MG/10ML IV SOLN
INTRAVENOUS | Status: DC | PRN
  Administered 2016-05-09: 10 mg via INTRAVENOUS

## 2016-05-09 MED ORDER — FENTANYL CITRATE (PF) 100 MCG/2ML IJ SOLN
INTRAMUSCULAR | Status: AC
  Filled 2016-05-09: qty 2

## 2016-05-09 MED ORDER — FENTANYL CITRATE (PF) 100 MCG/2ML IJ SOLN: 25.0000 ug | INTRAMUSCULAR | Status: DC | PRN

## 2016-05-09 MED ORDER — EPHEDRINE SULFATE 50 MG/ML IJ SOLN
INTRAMUSCULAR | Status: DC | PRN
  Administered 2016-05-09 (×2): 10 mg via INTRAVENOUS

## 2016-05-09 MED ORDER — MIDAZOLAM HCL 2 MG/2ML IJ SOLN
INTRAMUSCULAR | Status: DC | PRN
  Administered 2016-05-09: 2 mg via INTRAVENOUS

## 2016-05-09 MED ORDER — MIDAZOLAM HCL 2 MG/2ML IJ SOLN
INTRAMUSCULAR | Status: AC
  Filled 2016-05-09: qty 2

## 2016-05-09 MED ORDER — PROPOFOL 10 MG/ML IV BOLUS
INTRAVENOUS | Status: AC
  Filled 2016-05-09: qty 20

## 2016-05-09 MED ORDER — SODIUM CHLORIDE 0.9 % IV SOLN: INTRAVENOUS | Status: DC

## 2016-05-09 MED ORDER — PHENYLEPHRINE HCL 10 MG/ML IJ SOLN
INTRAMUSCULAR | Status: DC | PRN
  Administered 2016-05-09 (×5): 100 ug via INTRAVENOUS

## 2016-05-09 MED ORDER — PROPOFOL 10 MG/ML IV BOLUS
INTRAVENOUS | Status: DC | PRN
  Administered 2016-05-09: 20 mg via INTRAVENOUS
  Administered 2016-05-09: 150 mg via INTRAVENOUS

## 2016-05-09 MED ORDER — EPHEDRINE SULFATE 50 MG/ML IJ SOLN
INTRAMUSCULAR | Status: AC
  Filled 2016-05-09: qty 1

## 2016-05-09 MED ORDER — FENTANYL CITRATE (PF) 100 MCG/2ML IJ SOLN
INTRAMUSCULAR | Status: DC | PRN
  Administered 2016-05-09 (×2): 50 ug via INTRAVENOUS

## 2016-05-09 MED ORDER — ONDANSETRON HCL 4 MG/2ML IJ SOLN: 4.0000 mg | Freq: Four times a day (QID) | INTRAMUSCULAR | Status: DC | PRN

## 2016-05-09 MED ORDER — CHLORHEXIDINE GLUCONATE 4 % EX LIQD
60.0000 mL | Freq: Once | CUTANEOUS | Status: AC
  Administered 2016-05-09: 4 via TOPICAL

## 2016-05-09 MED ORDER — ONDANSETRON HCL 4 MG/2ML IJ SOLN
INTRAMUSCULAR | Status: DC | PRN
  Administered 2016-05-09: 4 mg via INTRAVENOUS

## 2016-05-09 MED ORDER — METOCLOPRAMIDE HCL 5 MG/ML IJ SOLN: 5.0000 mg | Freq: Three times a day (TID) | INTRAMUSCULAR | Status: DC | PRN

## 2016-05-09 MED ORDER — BUPIVACAINE-EPINEPHRINE 0.25% -1:200000 IJ SOLN
INTRAMUSCULAR | Status: DC | PRN
  Administered 2016-05-09: 25 mL
  Administered 2016-05-09: 5 mL

## 2016-05-09 MED ORDER — HYDROCODONE-ACETAMINOPHEN 5-325 MG PO TABS: 1.0000 | ORAL_TABLET | ORAL | 0 refills | Status: DC | PRN

## 2016-05-09 MED ORDER — OXYCODONE HCL 5 MG PO TABS: 5.0000 mg | ORAL_TABLET | Freq: Once | ORAL | Status: DC | PRN

## 2016-05-09 MED ORDER — SODIUM CHLORIDE 0.9 % IV SOLN
INTRAVENOUS | Status: DC
  Administered 2016-05-09: 07:00:00 via INTRAVENOUS

## 2016-05-09 SURGICAL SUPPLY — 23 items
BLADE SHAVER 4.5 DBL SERAT CV (CUTTER) ×3 IMPLANT
BNDG ESMARK 6X12 TAN STRL LF (GAUZE/BANDAGES/DRESSINGS) ×3 IMPLANT
CUFF TOURN 24 STER (MISCELLANEOUS) IMPLANT
CUFF TOURN 30 STER DUAL PORT (MISCELLANEOUS) ×3 IMPLANT
DRSG DERMACEA 8X12 NADH (GAUZE/BANDAGES/DRESSINGS) ×3 IMPLANT
DURAPREP 26ML APPLICATOR (WOUND CARE) ×6 IMPLANT
GAUZE SPONGE 4X4 12PLY STRL (GAUZE/BANDAGES/DRESSINGS) ×3 IMPLANT
GLOVE BIOGEL M STRL SZ7.5 (GLOVE) ×3 IMPLANT
GLOVE INDICATOR 8.0 STRL GRN (GLOVE) ×3 IMPLANT
GOWN STRL REUS W/ TWL LRG LVL3 (GOWN DISPOSABLE) ×2 IMPLANT
GOWN STRL REUS W/TWL LRG LVL3 (GOWN DISPOSABLE) ×4
IV LACTATED RINGER IRRG 3000ML (IV SOLUTION) ×12
IV LR IRRIG 3000ML ARTHROMATIC (IV SOLUTION) ×6 IMPLANT
KIT RM TURNOVER STRD PROC AR (KITS) ×3 IMPLANT
MANIFOLD NEPTUNE II (INSTRUMENTS) ×3 IMPLANT
PACK ARTHROSCOPY KNEE (MISCELLANEOUS) ×3 IMPLANT
SET TUBE SUCT SHAVER OUTFL 24K (TUBING) ×3 IMPLANT
SET TUBE TIP INTRA-ARTICULAR (MISCELLANEOUS) ×3 IMPLANT
SUT ETHILON 3-0 FS-10 30 BLK (SUTURE) ×3
SUTURE EHLN 3-0 FS-10 30 BLK (SUTURE) ×1 IMPLANT
TUBING ARTHRO INFLOW-ONLY STRL (TUBING) ×3 IMPLANT
WAND HAND CNTRL MULTIVAC 50 (MISCELLANEOUS) IMPLANT
WRAP KNEE W/COLD PACKS 25.5X14 (SOFTGOODS) ×3 IMPLANT

## 2016-05-09 NOTE — Anesthesia Postprocedure Evaluation (Signed)
Anesthesia Post Note  Patient: Patrick Gordon  Procedure(s) Performed: Procedure(s) (LRB): ARTHROSCOPY KNEE, PARTIAL MEDIAL MENISECTOMY (Left)  Patient location during evaluation: PACU Anesthesia Type: General Level of consciousness: awake and alert Pain management: pain level controlled Vital Signs Assessment: post-procedure vital signs reviewed and stable Respiratory status: spontaneous breathing, nonlabored ventilation, respiratory function stable and patient connected to nasal cannula oxygen Cardiovascular status: blood pressure returned to baseline and stable Postop Assessment: no signs of nausea or vomiting Anesthetic complications: no     Last Vitals:  Vitals:   05/09/16 1016 05/09/16 1023  BP: (!) 143/77 130/78  Pulse: 74 73  Resp: 16 16  Temp: 36.2 C     Last Pain:  Vitals:   05/09/16 1000  TempSrc:   PainSc: 0-No pain                 Cleda MccreedyJoseph K Piscitello

## 2016-05-09 NOTE — Discharge Instructions (Signed)
°  Instructions after Knee Arthroscopy  ° °- James P. Hooten, Jr., M.D.    ° Dept. of Orthopaedics & Sports Medicine ° [facility] ° [address] ° [place],   [address] ° ° Phone: [phone]   Fax: [phone] ° ° °DIET: °• Drink plenty of non-alcoholic fluids & begin a light diet. °• Resume your normal diet the day after surgery. ° °ACTIVITY:  °• You may use crutches or a walker with weight-bearing as tolerated, unless instructed otherwise. °• You may wean yourself off of the walker or crutches as tolerated.  °• Begin doing gentle exercises. Exercising will reduce the pain and swelling, increase motion, and prevent muscle weakness.   °• Avoid strenuous activities or athletics for a minimum of 4-6 weeks after arthroscopic surgery. °• Do not drive or operate any equipment until instructed. ° °WOUND CARE:  °• Place one to two pillows under the knee the first day or two when sitting or lying.  °• Continue to use the ice packs periodically to reduce pain and swelling. °• The small incisions in your knee are closed with nylon stitches. The stitches will be removed in the office. °• The bulky dressing may be removed on the second day after surgery. DO NOT TOUCH THE STITCHES. Put a Band-Aid over each stitch. Do NOT use any ointments or creams on the incisions.  °• You may bathe or shower after the stitches are removed at the first office visit following surgery. ° °MEDICATIONS: °• You may resume your regular medications. °• Please take the pain medication as prescribed. °• Do not take pain medication on an empty stomach. °• Do not drive or drink alcoholic beverages when taking pain medications. ° °CALL THE OFFICE FOR: °• Temperature above 101 degrees °• Excessive bleeding or drainage on the dressing. °• Excessive swelling, coldness, or paleness of the toes. °• Persistent nausea and vomiting. ° °FOLLOW-UP:  °• You should have an appointment to return to the office in 7-10 days after surgery.   ° ° °AMBULATORY SURGERY  °DISCHARGE INSTRUCTIONS ° ° °1) The drugs that you were given will stay in your system until tomorrow so for the next 24 hours you should not: ° °A) Drive an automobile °B) Make any legal decisions °C) Drink any alcoholic beverage ° ° °2) You may resume regular meals tomorrow.  Today it is better to start with liquids and gradually work up to solid foods. ° °You may eat anything you prefer, but it is better to start with liquids, then soup and crackers, and gradually work up to solid foods. ° ° °3) Please notify your doctor immediately if you have any unusual bleeding, trouble breathing, redness and pain at the surgery site, drainage, fever, or pain not relieved by medication. ° ° ° °4) Additional Instructions: ° ° ° ° ° ° ° °Please contact your physician with any problems or Same Day Surgery at [phone], Monday through Friday 6 am to 4 pm, or [place] [facility] [place] Main number at [phone]. ° °

## 2016-05-09 NOTE — Op Note (Signed)
OPERATIVE NOTE  DATE OF SURGERY:  05/09/2016  PATIENT NAME:  Patrick Gordon   DOB: [birth year]  MRN: [id number]   PRE-OPERATIVE DIAGNOSIS:  Internal derangement of the left knee   POST-OPERATIVE DIAGNOSIS:   Tear of the posterior horn of the medial meniscus, left knee  PROCEDURE:  Left knee arthroscopy, partial medial meniscectomy  SURGEON:  Jena GaussJames P Hooten, Jr., M.D.   ASSISTANT: none  ANESTHESIA: general  ESTIMATED BLOOD LOSS: Minimal  FLUIDS REPLACED: 1000 mL of crystalloid  TOURNIQUET TIME: Not used   DRAINS: none  IMPLANTS UTILIZED: None  INDICATIONS FOR SURGERY: Patrick Gordon is a [age] y.o. year old male who has been seen for complaints of left knee pain. MRI demonstrated findings consistent with meniscal pathology. After discussion of the risks and benefits of surgical intervention, the patient expressed understanding of the risks benefits and agree with plans for left knee arthroscopy.   PROCEDURE IN DETAIL: The patient was brought into the operating room and, after adequate general anesthesia was achieved, a tourniquet was applied to the left thigh and the leg was placed in the leg holder. All bony prominences were well padded. The patient's left knee was cleaned and prepped with alcohol and Duraprep and draped in the usual sterile fashion. A "timeout" was performed as per usual protocol. The anticipated portal sites were injected with 0.25% Marcaine with epinephrine. An anterolateral incision was made and a cannula was inserted. A large effusion was evacuated and the knee was distended with fluid using the pump. The scope was advanced down the medial gutter into the medial compartment. Under visualization with the scope, an anteromedial portal was created and a hooked probe was inserted. The medial meniscus was visualized and probed. There was a complex degenerative tear involving the posterior horn of the medial meniscus. The tear was debrided using meniscal punches and  a 4.5 mm incisor shaver. Final contouring was performed using a 50 wand. The articular cartilage was visualized and only mild changes were noted.  The scope was then advanced into the intercondylar notch. The anterior cruciate ligament was visualized and probed and felt to be intact. The scope was removed from the lateral portal and reinserted via the anteromedial portal to better visualize the lateral compartment. The lateral meniscus was visualized and probed. The lateral meniscus was intact without any evidence of tear or instability. The articular cartilage of the lateral compartment was visualized and noted to be in good condition. Finally, the scope was advanced so as to visualize the patellofemoral articulation. Good patellar tracking was appreciated. The articular surface was in good condition.  The knee was irrigated with copius amounts of fluid and suctioned dry. The anterolateral portal was re-approximated with #3-0 nylon. A combination of 0.25% Marcaine with epinephrine and 4 mg of Morphine were injected via the scope. The scope was removed and the anteromedial portal was re-approximated with #3-0 nylon. A sterile dressing was applied followed by application of an ice wrap.  The patient tolerated the procedure well and was transported to the PACU in stable condition.  James P. Angie FavaHooten, Jr., M.D.

## 2016-05-09 NOTE — Anesthesia Preprocedure Evaluation (Signed)
Anesthesia Evaluation  Patient identified by MRN, date of birth, ID band Patient awake    Reviewed: Allergy & Precautions, H&P , NPO status , Patient's Chart, lab work & pertinent test results  History of Anesthesia Complications Negative for: history of anesthetic complications  Airway Mallampati: III  TM Distance: <3 FB Neck ROM: limited    Dental  (+) Poor Dentition, Chipped, Implants, Caps   Pulmonary neg shortness of breath, former smoker,    Pulmonary exam normal breath sounds clear to auscultation       Cardiovascular Exercise Tolerance: Good (-) angina+ CAD, + Past MI and + Cardiac Stents  (-) DOE Normal cardiovascular exam+ dysrhythmias  Rhythm:regular Rate:Normal     Neuro/Psych PSYCHIATRIC DISORDERS Anxiety negative neurological ROS     GI/Hepatic Neg liver ROS, GERD  Controlled,  Endo/Other  diabetes, Type 2  Renal/GU      Musculoskeletal   Abdominal   Peds  Hematology negative hematology ROS (+)   Anesthesia Other Findings Signs and symptoms suggestive of sleep apnea   Past Medical History: No date: Acute MI anterior wall subsequent episode care*     Comment: 2014 No date: Anxiety No date: Borderline diabetes No date: CAD (coronary artery disease) No date: Diabetes mellitus without complication (HCC)     Comment: boraderline No date: Dyslipidemia 2014: Dysrhythmia     Comment: V-FIB No date: GERD (gastroesophageal reflux disease) No date: Thyroid nodule  Past Surgical History: No date: CARDIAC CATHETERIZATION 2014: CORONARY ANGIOPLASTY WITH STENT PLACEMENT     Comment: 2 STENTS IN LAD 12/29/2014: KNEE ARTHROSCOPY Right     Comment: Procedure: ARTHROSCOPY KNEE partial medial               menisectomy;  Surgeon: Donato HeinzJames P Hooten, MD;                Location: [facility] ORS;  Service: Orthopedics;                Laterality: Right; 04/22/2012: LEFT HEART CATHETERIZATION WITH CORONARY ANGIO* N/A  Comment: Procedure: LEFT HEART CATHETERIZATION WITH               CORONARY ANGIOGRAM;  Surgeon: Peter M SwazilandJordan,               MD;  [facility];  Service:               Cardiovascular;  Laterality: N/A; 04/22/2013: LEFT HEART CATHETERIZATION WITH CORONARY ANGIO* N/A     Comment: Procedure: LEFT HEART CATHETERIZATION WITH               CORONARY ANGIOGRAM;  Surgeon: Peter M SwazilandJordan,               MD;  [facility];  Service:               Cardiovascular;  Laterality: N/A; No date: MANDIBLE OSTEOTOMY     Reproductive/Obstetrics negative OB ROS                             Anesthesia Physical Anesthesia Plan  ASA: III  Anesthesia Plan: General LMA   Post-op Pain Management:    Induction:   Airway Management Planned:   Additional Equipment:   Intra-op Plan:   Post-operative Plan:   Informed Consent: I have reviewed the patients History and Physical, chart, labs and discussed the procedure including the risks, benefits and alternatives for the  proposed anesthesia with the patient or authorized representative who has indicated his/her understanding and acceptance.   Dental Advisory Given  Plan Discussed with: Anesthesiologist, CRNA and Surgeon  Anesthesia Plan Comments:         Anesthesia Quick Evaluation

## 2016-05-09 NOTE — H&P (Signed)
The patient has been re-examined, and the chart reviewed, and there have been no interval changes to the documented history and physical.    The risks, benefits, and alternatives have been discussed at length. The patient expressed understanding of the risks benefits and agreed with plans for surgical intervention.  James P. Hooten, Jr. M.D.    

## 2016-05-09 NOTE — Transfer of Care (Signed)
Immediate Anesthesia Transfer of Care Note  Patient: Patrick Gordon  Procedure(s) Performed: Procedure(s): ARTHROSCOPY KNEE, PARTIAL MEDIAL MENISECTOMY (Left)  Patient Location: PACU  Anesthesia Type:General  Level of Consciousness: awake, alert  and oriented  Airway & Oxygen Therapy: Patient Spontanous Breathing and Patient connected to nasal cannula oxygen  Post-op Assessment: Report given to RN and Post -op Vital signs reviewed and stable  Post vital signs: Reviewed and stable  Last Vitals:  Vitals:   05/09/16 0930 05/09/16 0945  BP: 114/65 126/75  Pulse: 94 84  Resp: 18 20  Temp: 36.1 C     Last Pain:  Vitals:   05/09/16 0945  TempSrc:   PainSc: 0-No pain         Complications: No apparent anesthesia complications

## 2016-05-09 NOTE — Anesthesia Procedure Notes (Signed)
Procedure Name: LMA Insertion Date/Time: 05/09/2016 7:38 AM Performed by: Edyth GunnelsGILBERT, Lakenya Riendeau Pre-anesthesia Checklist: Patient identified, Emergency Drugs available, Suction available and Patient being monitored Patient Re-evaluated:Patient Re-evaluated prior to inductionOxygen Delivery Method: Circle system utilized Preoxygenation: Pre-oxygenation with 100% oxygen Intubation Type: IV induction Ventilation: Mask ventilation without difficulty LMA: LMA inserted LMA Size: 4.5 Number of attempts: 1 Placement Confirmation: positive ETCO2 and breath sounds checked- equal and bilateral Tube secured with: Tape Dental Injury: Teeth and Oropharynx as per pre-operative assessment

## 2016-05-09 NOTE — Anesthesia Post-op Follow-up Note (Cosign Needed)
Anesthesia QCDR form completed.        

## 2016-05-21 NOTE — Progress Notes (Signed)
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 He underwent emergent stenting of the proximal to mid LAD with 2 long drug-eluting stents. Ejection fraction at cath was 25-30%. Echocardiogram demonstrated an ejection fraction 35%.Subsequent evaluation in [place] showed an EF 51% with small apical defect. He was concerned enough about his cardiac status that he underwent repeat cardiac cath on Feb. 6, 2015. This showed nonobstructive CAD with patent stents. EF was 50-55% with apical wall motion abnormality.  On follow up today he is doing very well. He had arthroscopic left knee surgery on 05/09/16.   No chest pain or dyspnea. He has lost 18 lbs. Is still doing well with reducing his work stress. 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  Current Outpatient Prescriptions on File Prior to Visit  Medication Sig Dispense Refill  . aspirin EC 81 MG tablet Take 81 mg by mouth daily.    Marland Kitchen atorvastatin (LIPITOR) 10 MG tablet Take 1 tablet (10 mg total) by mouth daily. (Patient taking differently: Take 10 mg by mouth every morning. ) 90 tablet 3  . carvedilol (COREG) 12.5 MG tablet Take 1 tablet (12.5 mg total) by mouth 2 (two) times daily. 180 tablet 3  . enalapril (VASOTEC) 2.5 MG tablet Take 1 tablet (2.5 mg total) by mouth 2 (two) times daily. 180 tablet 3  . liraglutide (VICTOZA) 18 MG/3ML SOPN Inject 1.8 mg into the skin daily.    . ticagrelor (BRILINTA) 60 MG TABS tablet Take 1 tablet (60 mg total) by mouth 2 (two) times daily. 180 tablet 3   No current facility-administered medications on file prior to visit.     Allergies  Allergen Reactions  . Morphine And Related Nausea And Vomiting  . Sulfa Antibiotics Rash    "FIXED DRUG ERUPTION"    Past Medical History:  Diagnosis Date  . Acute MI anterior wall  subsequent episode care (HCC)    2014  . Anxiety   . Borderline diabetes   . CAD (coronary artery disease)   . Diabetes mellitus without complication (HCC)    boraderline  . Dyslipidemia   . Dysrhythmia 2014   V-FIB  . GERD (gastroesophageal reflux disease)   . Thyroid nodule     Past Surgical History:  Procedure Laterality Date  . CARDIAC CATHETERIZATION    . CORONARY ANGIOPLASTY WITH STENT PLACEMENT  2014   2 STENTS IN LAD  . KNEE ARTHROSCOPY Right 12/29/2014   Procedure: ARTHROSCOPY KNEE partial medial menisectomy;  Surgeon: [name], MD;  Location: [facility] ORS;  Service: Orthopedics;  Laterality: Right;  . KNEE ARTHROSCOPY Left 05/09/2016   Procedure: ARTHROSCOPY KNEE, PARTIAL MEDIAL MENISECTOMY;  Surgeon: [name], MD;  Location: [facility] ORS;  Service: Orthopedics;  Laterality: Left;  . LEFT HEART CATHETERIZATION WITH CORONARY ANGIOGRAM N/A 04/22/2012   Procedure: LEFT HEART CATHETERIZATION WITH CORONARY ANGIOGRAM;  Surgeon: [name], MD;  Location: [facility] CATH LAB;  Service: Cardiovascular;  Laterality: N/A;  . LEFT HEART CATHETERIZATION WITH CORONARY ANGIOGRAM N/A 04/22/2013   Procedure: LEFT HEART CATHETERIZATION WITH CORONARY ANGIOGRAM;  Surgeon: [name], MD;  Location: [facility] CATH LAB;  Service: Cardiovascular;  Laterality: N/A;  . MANDIBLE OSTEOTOMY      History  Smoking Status  . Former Smoker  . Types: Cigars  . Quit date: 12/26/2012  Smokeless Tobacco  .  Never Used    History  Alcohol Use  . 4.2 oz/week  . 7 Glasses of wine per week    Comment: one glass of wine per day    Family History  Problem Relation Age of Onset  . Hypertension Mother   . Stroke Mother   . COPD Father   . Diabetes Father   . Hypertension Father   . Congestive Heart Failure Father     Review of Systems: As noted in history of present illness. His energy level is good.  All other systems were reviewed and are negative.  Physical Exam: BP 130/72 (BP Location: Left  Arm, Patient Position: Sitting, Cuff Size: Normal)   Pulse 76   Ht 5\' 9"  (1.753 m)   Wt 206 lb 9.6 oz (93.7 kg)   BMI 30.51 kg/m  He is a pleasant male in no acute distress. HEENT: Unremarkable. No jugular venous distention or bruits. Lungs: Clear Cardiovascular: Regular rate and rhythm, normal S1-S2, soft grade 1/6 systolic ejection murmur at the left sternal border. Abdomen: Soft and nontender. Extremities: No cyanosis or edema. Pulses 2+. Neuro: Alert and oriented x3. Cranial nerves II through XII are intact.  LABORATORY DATA: Labs reviewed from 02/11/16: Normal CMET. gluose 139. cholesterol 105, trig 150, HDL 43, LDL 32. CBC normal. A1c 5.5%. Normal thyroid studies.  Ecg today shows NSR with old septal infarct. No acute change. I have personally reviewed and interpreted this study.    Assessment / Plan: 1. Anterior STEMI complicated by ventricular fibrillation arrest in 2014. Status post DES x2 to the proximal and mid LAD in February 2014. Clinically he is doing well. Repeat cardiac cath one year later showed nonobstructive disease and EF has improved.  Doing very well. Continue DAPT with reduced dose Brilinta indefinitely. Follow up in 6 months.   2. Ischemic cardiomyopathy. Initial Ejection fraction 25-35%. Now improved to 50-55%. Continue current doses of ACE inhibitor and carvedilol.  3. Mild dyslipidemia. Continue statin therapy. Dietary modification.encouraged continued weight loss.

## 2016-05-22 ENCOUNTER — Encounter: Payer: Self-pay | Admitting: Cardiology

## 2016-05-23 ENCOUNTER — Encounter: Payer: Self-pay | Admitting: Cardiology

## 2016-05-23 ENCOUNTER — Ambulatory Visit (INDEPENDENT_AMBULATORY_CARE_PROVIDER_SITE_OTHER): Payer: BLUE CROSS/BLUE SHIELD | Admitting: Cardiology

## 2016-05-23 VITALS — BP 130/72 | HR 76 | Ht 69.0 in | Wt 206.6 lb

## 2016-05-23 DIAGNOSIS — I2510 Atherosclerotic heart disease of native coronary artery without angina pectoris: Secondary | ICD-10-CM | POA: Diagnosis not present

## 2016-05-23 DIAGNOSIS — E785 Hyperlipidemia, unspecified: Secondary | ICD-10-CM | POA: Diagnosis not present

## 2016-05-23 DIAGNOSIS — I252 Old myocardial infarction: Secondary | ICD-10-CM

## 2016-05-23 NOTE — Patient Instructions (Addendum)
Continue your current therapy  I will see you in about 6 months   

## 2016-05-27 ENCOUNTER — Telehealth: Payer: Self-pay | Admitting: Cardiology

## 2016-05-27 NOTE — Telephone Encounter (Signed)
Message sent to Dr.Jordan for advice. 

## 2016-05-27 NOTE — Telephone Encounter (Signed)
New Message   Non urgent:   Pt is planning his vacation and wants to know if he is allowed to go scuba diving?

## 2016-05-28 NOTE — Telephone Encounter (Signed)
I guess it would be ok. We really don't have any data on this.   Antavia Tandy SwazilandJordan MD, [facility]

## 2016-05-29 NOTE — Telephone Encounter (Signed)
[facility] Dr.Jordan's recommendations given.

## 2016-12-26 ENCOUNTER — Ambulatory Visit: Payer: BLUE CROSS/BLUE SHIELD | Admitting: Cardiology

## 2017-02-02 DIAGNOSIS — E041 Nontoxic single thyroid nodule: Secondary | ICD-10-CM | POA: Insufficient documentation

## 2017-02-02 DIAGNOSIS — F419 Anxiety disorder, unspecified: Secondary | ICD-10-CM | POA: Insufficient documentation

## 2017-02-02 DIAGNOSIS — E119 Type 2 diabetes mellitus without complications: Secondary | ICD-10-CM | POA: Insufficient documentation

## 2017-02-02 DIAGNOSIS — I2109 ST elevation (STEMI) myocardial infarction involving other coronary artery of anterior wall: Secondary | ICD-10-CM | POA: Insufficient documentation

## 2017-02-13 ENCOUNTER — Ambulatory Visit (INDEPENDENT_AMBULATORY_CARE_PROVIDER_SITE_OTHER): Payer: Medicare Other | Admitting: Cardiology

## 2017-02-13 ENCOUNTER — Encounter (INDEPENDENT_AMBULATORY_CARE_PROVIDER_SITE_OTHER): Payer: Self-pay

## 2017-02-13 ENCOUNTER — Encounter: Payer: Self-pay | Admitting: Cardiology

## 2017-02-13 VITALS — BP 128/76 | HR 88 | Ht 68.0 in | Wt 192.8 lb

## 2017-02-13 DIAGNOSIS — I252 Old myocardial infarction: Secondary | ICD-10-CM | POA: Diagnosis not present

## 2017-02-13 DIAGNOSIS — I2510 Atherosclerotic heart disease of native coronary artery without angina pectoris: Secondary | ICD-10-CM | POA: Diagnosis not present

## 2017-02-13 DIAGNOSIS — E785 Hyperlipidemia, unspecified: Secondary | ICD-10-CM

## 2017-02-13 NOTE — Progress Notes (Signed)
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 He underwent emergent stenting of the proximal to mid LAD with 2 long drug-eluting stents. Ejection fraction at cath was 25-30%. Echocardiogram demonstrated an ejection fraction 35%.[place] EF 51% with small apical defect. He was concerned enough about his cardiac status that he underwent repeat cardiac cath on Feb. 6, 2015. This showed nonobstructive CAD with patent stents. EF was 50-55% with apical wall motion abnormality.    On follow up today he is doing very well. He had arthroscopic left knee surgery on 05/09/16. As a result he really hasn't returned to exercise. He has lost an additional 14  Lbs with dietary modification. In total he has lost 32 lbs. The only time he notes chest pressure is when he is under stress. Reports last A1c was 5.2% off Victoza. Total cholesterol was 100 with LDL in 60s. BP is well controlled.  Current Outpatient Medications on File Prior to Visit  Medication Sig Dispense Refill  . aspirin EC 81 MG tablet Take 81 mg by mouth daily.    Marland Kitchen. atorvastatin (LIPITOR) 10 MG tablet Take 1 tablet (10 mg total) by mouth daily. 90 tablet 3  . carvedilol (COREG) 12.5 MG tablet Take 1 tablet (12.5 mg total) by mouth 2 (two) times daily. 180 tablet 3  . enalapril (VASOTEC) 2.5 MG tablet Take 1 tablet (2.5 mg total) by mouth 2 (two) times daily. 180 tablet 3  . ticagrelor (BRILINTA) 60 MG TABS tablet Take 1 tablet (60 mg total) by mouth 2 (two) times daily. 180 tablet 3   No current facility-administered medications on file prior to visit.     Allergies  Allergen Reactions  . Morphine And Related Nausea And Vomiting  . Sulfa Antibiotics Rash    "FIXED DRUG ERUPTION"    Past Medical History:  Diagnosis Date  . Acute MI anterior wall  subsequent episode care (HCC)    2014  . Anxiety   . Borderline diabetes   . CAD (coronary artery disease)   . Diabetes mellitus without complication (HCC)    boraderline  . Dyslipidemia   . Dysrhythmia 2014   V-FIB  . GERD (gastroesophageal reflux disease)   . Thyroid nodule     Past Surgical History:  Procedure Laterality Date  . CARDIAC CATHETERIZATION    . CORONARY ANGIOPLASTY WITH STENT PLACEMENT  2014   2 STENTS IN LAD  . KNEE ARTHROSCOPY Right 12/29/2014   Procedure: ARTHROSCOPY KNEE partial medial menisectomy;  Surgeon: Donato HeinzJames P Hooten, MD;  Location: [facility] ORS;  Service: Orthopedics;  Laterality: Right;  . KNEE ARTHROSCOPY Left 05/09/2016   Procedure: ARTHROSCOPY KNEE, PARTIAL MEDIAL MENISECTOMY;  Surgeon: Donato HeinzJames P Hooten, MD;  Location: [facility] ORS;  Service: Orthopedics;  Laterality: Left;  . LEFT HEART CATHETERIZATION WITH CORONARY ANGIOGRAM N/A 04/22/2012   Procedure: LEFT HEART CATHETERIZATION WITH CORONARY ANGIOGRAM;  Surgeon: Peter M SwazilandJordan, MD;  [facility];  Service: Cardiovascular;  Laterality: N/A;  . LEFT HEART CATHETERIZATION WITH CORONARY ANGIOGRAM N/A 04/22/2013   Procedure: LEFT HEART CATHETERIZATION WITH CORONARY ANGIOGRAM;  Surgeon: Peter M SwazilandJordan, MD;  [facility];  Service: Cardiovascular;  Laterality: N/A;  . MANDIBLE OSTEOTOMY      Social History   Tobacco Use  Smoking Status Former Smoker  . Types: Cigars  . Last  attempt to quit: 12/26/2012  . Years since quitting: 4.1  Smokeless Tobacco Never Used    Social History   Substance and Sexual Activity  Alcohol Use Yes  . Alcohol/week: 4.2 oz  . Types: 7 Glasses of wine per week   Comment: one glass of wine per day    Family History  Problem Relation Age of Onset  . Hypertension Mother   . Stroke Mother   . COPD Father   . Diabetes Father   . Hypertension Father   . Congestive Heart Failure Father     Review of Systems: As noted in history of present illness. His energy  level is good.  All other systems were reviewed and are negative.  Physical Exam: BP 128/76   Pulse 88   Ht 5\' 8"  (1.727 m)   Wt 192 lb 12.8 oz (87.5 kg)   SpO2 96%   BMI 29.32 kg/m  He is a pleasant male in no acute distress. HEENT: Unremarkable. No jugular venous distention or bruits. Lungs: Clear Cardiovascular: Regular rate and rhythm, normal S1-S2, soft grade 1/6 systolic ejection murmur at the left sternal border. Abdomen: Soft and nontender. Extremities: No cyanosis or edema. Pulses 2+. Neuro: Alert and oriented x3. Cranial nerves II through XII are intact.  LABORATORY DATA: Labs reviewed from 02/11/16: Normal CMET. gluose 139. cholesterol 105, trig 150, HDL 43, LDL 32. CBC normal. A1c 5.5%. Normal thyroid studies.   Assessment / Plan: 1. Anterior STEMI complicated by ventricular fibrillation arrest in 2014. Status post DES x2 to the proximal and mid LAD in February 2014. Clinically he is doing well. Repeat cardiac cath one year later showed nonobstructive disease and EF has improved.  We discussed doing a follow up stress test but he wants to ease back into his exercise program and monitor symptoms for now. Doing very well. Continue DAPT with reduced dose Brilinta indefinitely. Follow up in 6 months.   2. Ischemic cardiomyopathy. Initial Ejection fraction 25-35%. Now improved to 50-55%. Continue current doses of ACE inhibitor and carvedilol. Since EF has recovered I don't think it is necessary to push dose higher.   3. Dyslipidemia. Continue statin therapy. Dietary modification.encouraged continued weight loss.

## 2017-02-13 NOTE — Patient Instructions (Signed)
Continue your current therapy  I will see you in 6 months.   

## 2017-06-24 ENCOUNTER — Telehealth: Payer: Self-pay | Admitting: Cardiology

## 2017-07-31 ENCOUNTER — Ambulatory Visit: Payer: Medicare Other | Admitting: Cardiology

## 2017-08-10 NOTE — Progress Notes (Signed)
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 He underwent emergent stenting of the proximal to mid LAD with 2 long drug-eluting stents. Ejection fraction at cath was 25-30%. Echocardiogram demonstrated an ejection fraction 35%.Subsequent evaluation in [place] showed an EF 51% with small apical defect. He was concerned enough about his cardiac status that he underwent repeat cardiac cath on Feb. 6, 2015. This showed nonobstructive CAD with patent stents. EF was 50-55% with apical wall motion abnormality.    On follow up today he is doing very well. He had arthroscopic left knee surgery last year. He has increased his activity and is now walking 5-10 miles/day 5 days/week. He feels very well. No chest pain or dyspnea.  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   Current Outpatient Medications on File Prior to Visit  Medication Sig Dispense Refill  . aspirin EC 81 MG tablet Take 81 mg by mouth daily.    Marland Kitchen atorvastatin (LIPITOR) 10 MG tablet Take 1 tablet (10 mg total) by mouth daily. 90 tablet 3  . carvedilol (COREG) 12.5 MG tablet Take 1 tablet (12.5 mg total) by mouth 2 (two) times daily. 180 tablet 3  . enalapril (VASOTEC) 2.5 MG tablet Take 1 tablet (2.5 mg total) by mouth 2 (two) times daily. 180 tablet 3  . ticagrelor (BRILINTA) 60 MG TABS tablet Take 1 tablet (60 mg total) by mouth 2 (two) times daily. 180 tablet 3   No current facility-administered medications on file prior to visit.     Allergies  Allergen Reactions  . Morphine And Related Nausea And Vomiting  . Sulfa Antibiotics Rash    "FIXED DRUG ERUPTION"    Past Medical History:  Diagnosis Date  . Acute MI anterior wall subsequent episode care (HCC)    2014  . Anxiety   . Borderline diabetes   . CAD (coronary artery disease)   . Diabetes mellitus  without complication (HCC)    boraderline  . Dyslipidemia   . Dysrhythmia 2014   V-FIB  . GERD (gastroesophageal reflux disease)   . Thyroid nodule     Past Surgical History:  Procedure Laterality Date  . CARDIAC CATHETERIZATION    . CORONARY ANGIOPLASTY WITH STENT PLACEMENT  2014   2 STENTS IN LAD  . KNEE ARTHROSCOPY Right 12/29/2014   Procedure: ARTHROSCOPY KNEE partial medial menisectomy;  Surgeon: Patrick Heinz, MD;  Location: [facility] ORS;  Service: Orthopedics;  Laterality: Right;  . KNEE ARTHROSCOPY Left 05/09/2016   Procedure: ARTHROSCOPY KNEE, PARTIAL MEDIAL MENISECTOMY;  Surgeon: Patrick Heinz, MD;  Location: [facility] ORS;  Service: Orthopedics;  Laterality: Left;  . LEFT HEART CATHETERIZATION WITH CORONARY ANGIOGRAM N/A 04/22/2012   Procedure: LEFT HEART CATHETERIZATION WITH CORONARY ANGIOGRAM;  Surgeon: Patrick Parzych M Swaziland, MD;  Location[facility];  Service: Cardiovascular;  Laterality: N/A;  . LEFT HEART CATHETERIZATION WITH CORONARY ANGIOGRAM N/A 04/22/2013   Procedure: LEFT HEART CATHETERIZATION WITH CORONARY ANGIOGRAM;  Surgeon: Patrick Livesay M Swaziland, MD;  Location: [facility] C[facility] LAB;  Service: Cardiovascular;  Laterality: N/A;  . MANDIBLE OSTEOTOMY      Social History   Tobacco Use  Smoking Status Former Smoker  . Types: Cigars  . Last attempt to quit: 12/26/2012  . Years since quitting: 4.6  Smokeless Tobacco Never Used    Social History   Substance and Sexual  Activity  Alcohol Use Yes  . Alcohol/week: 4.2 oz  . Types: 7 Glasses of wine per week   Comment: one glass of wine per day    Family History  Problem Relation Age of Onset  . Hypertension Mother   . Stroke Mother   . COPD Father   . Diabetes Father   . Hypertension Father   . Congestive Heart Failure Father     Review of Systems: As noted in history of present illness. His energy level is good.  All other systems were reviewed and are negative.  Physical Exam: BP 122/62   Pulse 66   Ht 5' 7.5" (1.715 m)    Wt 192 lb 12.8 oz (87.5 kg)   BMI 29.75 kg/m  He is a pleasant male in no acute distress. HEENT: Unremarkable. No jugular venous distention or bruits. Lungs: Clear Cardiovascular: Regular rate and rhythm, normal S1-S2, soft grade 1-2/6 systolic ejection murmur RUSB>>LSB Abdomen: Soft and nontender. Extremities: No cyanosis or edema. Pulses 2+. Neuro: Alert and oriented x3. Cranial nerves II through XII are intact.  LABORATORY DATA: Labs reviewed from 02/11/16: Normal CMET. gluose 139. cholesterol 105, trig 150, HDL 43, LDL 32. CBC normal. A1c 5.5%. Normal thyroid studies. Dated 05/26/17: Normal CMET. Cholesterol 108, triglycerides 77, HDL 49, LDL 44. TFTs normal. CBC normal. A1c 5%. CRP 0.59.  Ecg today shows NSR rate 66. Old anteroseptal infarct. I have personally reviewed and interpreted this study.   Echo done 05/26/17: normal LV function. Mild LVH. LAE. No valvular disease.    Assessment / Plan: 1. Anterior STEMI complicated by ventricular fibrillation arrest in 2014. Status post DES x2 to the proximal and mid LAD in February 2014. Clinically he is doing well. Repeat cardiac cath one year later showed nonobstructive disease and EF has improved.   Doing very well. Continue DAPT with reduced dose Brilinta indefinitely as long as no bleeding problems. Follow up in 6 months.   2. Ischemic cardiomyopathy. Initial Ejection fraction 25-35%. Now improved to normal based on Echo this year. Continue current doses of ACE inhibitor and carvedilol. Since EF has recovered I don't think it is necessary to push dose higher.   3. Dyslipidemia. Continue statin therapy. Dietary modification. LDL excellent at 44.

## 2017-08-14 ENCOUNTER — Ambulatory Visit (INDEPENDENT_AMBULATORY_CARE_PROVIDER_SITE_OTHER): Payer: Medicare Other | Admitting: Cardiology

## 2017-08-14 ENCOUNTER — Encounter: Payer: Self-pay | Admitting: Cardiology

## 2017-08-14 VITALS — BP 122/62 | HR 66 | Ht 67.5 in | Wt 192.8 lb

## 2017-08-14 DIAGNOSIS — E785 Hyperlipidemia, unspecified: Secondary | ICD-10-CM

## 2017-08-14 DIAGNOSIS — I252 Old myocardial infarction: Secondary | ICD-10-CM

## 2017-08-14 DIAGNOSIS — I2510 Atherosclerotic heart disease of native coronary artery without angina pectoris: Secondary | ICD-10-CM | POA: Diagnosis not present

## 2017-08-14 NOTE — Patient Instructions (Addendum)
Continue your current therapy  I will see you in 6 months.   

## 2017-08-20 NOTE — Telephone Encounter (Signed)
close

## 2017-12-11 ENCOUNTER — Encounter

## 2018-02-26 ENCOUNTER — Ambulatory Visit: Payer: Medicare Other | Admitting: Cardiology

## 2018-03-26 ENCOUNTER — Encounter: Payer: Self-pay | Admitting: Cardiology

## 2018-04-05 ENCOUNTER — Encounter: Payer: Self-pay | Admitting: Cardiology

## 2018-04-11 NOTE — Progress Notes (Signed)
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 He underwent emergent stenting of the proximal to mid LAD with 2 long drug-eluting stents. Ejection fraction at cath was 25-30%. Echocardiogram demonstrated an ejection fraction 35%.Subsequent evaluation in [place] showed an EF 51% with small apical defect. He was concerned enough about his cardiac status that he underwent repeat cardiac cath on Feb. 6, 2015. This showed nonobstructive CAD with patent stents. EF was 50-55% with apical wall motion abnormality.   On follow up today he is doing very well. He denies any chest pain or SOB. He did travel to [place] and [place] this year and noted a little more limitation at altitude. No significant bleeding. No edema. BP consistently less than 120.   Current Outpatient Medications on File Prior to Visit  Medication Sig Dispense Refill  . aspirin EC 81 MG tablet Take 81 mg by mouth daily.    Marland Kitchen atorvastatin (LIPITOR) 10 MG tablet Take 1 tablet (10 mg total) by mouth daily. 90 tablet 3  . carvedilol (COREG) 12.5 MG tablet Take 1 tablet (12.5 mg total) by mouth 2 (two) times daily. 180 tablet 3  . enalapril (VASOTEC) 2.5 MG tablet Take 1 tablet (2.5 mg total) by mouth 2 (two) times daily. 180 tablet 3  . ticagrelor (BRILINTA) 60 MG TABS tablet Take 1 tablet (60 mg total) by mouth 2 (two) times daily. 180 tablet 3   No current facility-administered medications on file prior to visit.     Allergies  Allergen Reactions  . Morphine And Related Nausea And Vomiting  . Sulfa Antibiotics Rash    "FIXED DRUG ERUPTION"    Past Medical History:  Diagnosis Date  . Acute MI anterior wall subsequent episode care (HCC)    2014  . Anxiety   . Borderline diabetes   . CAD (coronary artery disease)   . Diabetes mellitus without complication (HCC)     boraderline  . Dyslipidemia   . Dysrhythmia 2014   V-FIB  . GERD (gastroesophageal reflux disease)   . Thyroid nodule     Past Surgical History:  Procedure Laterality Date  . CARDIAC CATHETERIZATION    . CORONARY ANGIOPLASTY WITH STENT PLACEMENT  2014   2 STENTS IN LAD  . KNEE ARTHROSCOPY Right 12/29/2014   Procedure: ARTHROSCOPY KNEE partial medial menisectomy;  Surgeon: [name], MD;  Location: [facility] ORS;  Service: Orthopedics;  Laterality: Right;  . KNEE ARTHROSCOPY Left 05/09/2016   Procedure: ARTHROSCOPY KNEE, PARTIAL MEDIAL MENISECTOMY;  Surgeon: [name], MD;  Location: [facility] ORS;  Service: Orthopedics;  Laterality: Left;  . LEFT HEART CATHETERIZATION WITH CORONARY ANGIOGRAM N/A 04/22/2012   Procedure: LEFT HEART CATHETERIZATION WITH CORONARY ANGIOGRAM;  Surgeon: [name], MD;  Location: [facility] CATH LAB;  Service: Cardiovascular;  Laterality: N/A;  . LEFT HEART CATHETERIZATION WITH CORONARY ANGIOGRAM N/A 04/22/2013   Procedure: LEFT HEART CATHETERIZATION WITH CORONARY ANGIOGRAM;  Surgeon: [name], MD;  [facility] CATH LAB;  Service: Cardiovascular;  Laterality: N/A;  . MANDIBLE OSTEOTOMY      Social History   Tobacco Use  Smoking Status Former Smoker  . Types: Cigars  . Last attempt to quit: 12/26/2012  . Years since quitting: 5.3  Smokeless Tobacco Never Used    Social History   Substance and Sexual Activity  Alcohol Use Yes  . Alcohol/week:  7.0 standard drinks  . Types: 7 Glasses of wine per week   Comment: one glass of wine per day    Family History  Problem Relation Age of Onset  . Hypertension Mother   . Stroke Mother   . COPD Father   . Diabetes Father   . Hypertension Father   . Congestive Heart Failure Father     Review of Systems: As noted in history of present illness. His energy level is good.  All other systems were reviewed and are negative.  Physical Exam: BP 136/74   Pulse 86   Ht 5\' 8"  (1.727 m)   Wt 202 lb 9.6 oz  (91.9 kg)   SpO2 96%   BMI 30.81 kg/m  GENERAL:  Well appearing male in NAD HEENT:  PERRL, EOMI, sclera are clear. Oropharynx is clear. NECK:  No jugular venous distention, carotid upstroke brisk and symmetric, no bruits, no thyromegaly or adenopathy LUNGS:  Clear to auscultation bilaterally CHEST:  Unremarkable HEART:  RRR,  PMI not displaced or sustained,S1 and S2 within normal limits, no S3, no S4: no clicks, no rubs, no murmurs ABD:  Soft, nontender. BS +, no masses or bruits. No hepatomegaly, no splenomegaly EXT:  2 + pulses throughout, no edema, no cyanosis no clubbing SKIN:  Warm and dry.  No rashes NEURO:  Alert and oriented x 3. Cranial nerves II through XII intact. PSYCH:  Cognitively intact    LABORATORY DATA: Labs reviewed from 02/11/16: Normal CMET. gluose 139. cholesterol 105, trig 150, HDL 43, LDL 32. CBC normal. A1c 5.5%. Normal thyroid studies. Dated 05/26/17: Normal CMET. Cholesterol 108, triglycerides 77, HDL 49, LDL 44. TFTs normal. CBC normal. A1c 5%. CRP 0.59. Dated 04/05/18: glucose 121, otherwise chemistries normal. Cholesterol 120, triglycerides 180, HDL 36, LDL 48. TSH and CBC normal. A1c 5.7%.   Echo done 05/26/17: normal LV function. Mild LVH. LAE. No valvular disease.    Assessment / Plan: 1. Anterior STEMI complicated by ventricular fibrillation arrest in 2014. Status post DES x2 to the proximal and mid LAD in February 2014. Clinically he is doing well. Repeat cardiac cath one year later showed nonobstructive disease and EF has improved.   He is asymptomatic. Continue DAPT with reduced dose Brilinta indefinitely as long as no bleeding problems. Follow up in one year  2. Ischemic cardiomyopathy. Initial Ejection fraction 25-35%. Now improved to normal based on last Echo.Continue current doses of ACE inhibitor and carvedilol.  3. Dyslipidemia. Continue statin therapy. Dietary modification. LDL excellent.  4. Prediabetes. Controlled.

## 2018-04-16 ENCOUNTER — Encounter: Payer: Self-pay | Admitting: Cardiology

## 2018-04-16 ENCOUNTER — Ambulatory Visit (INDEPENDENT_AMBULATORY_CARE_PROVIDER_SITE_OTHER): Payer: Medicare Other | Admitting: Cardiology

## 2018-04-16 VITALS — BP 136/74 | HR 86 | Ht 68.0 in | Wt 202.6 lb

## 2018-04-16 DIAGNOSIS — E785 Hyperlipidemia, unspecified: Secondary | ICD-10-CM | POA: Diagnosis not present

## 2018-04-16 DIAGNOSIS — I2510 Atherosclerotic heart disease of native coronary artery without angina pectoris: Secondary | ICD-10-CM

## 2018-04-16 DIAGNOSIS — I252 Old myocardial infarction: Secondary | ICD-10-CM

## 2018-04-16 NOTE — Addendum Note (Signed)
Addended by: Chana Bode on: 04/16/2018 04:04 PM   Modules accepted: Orders

## 2018-07-08 ENCOUNTER — Telehealth: Payer: Self-pay | Admitting: Cardiology

## 2018-07-08 ENCOUNTER — Other Ambulatory Visit: Payer: Self-pay | Admitting: Cardiology

## 2018-07-08 DIAGNOSIS — I200 Unstable angina: Secondary | ICD-10-CM

## 2018-07-08 NOTE — Telephone Encounter (Signed)
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 Over the past week he has noted progressive angina described as chest heaviness and some dyspnea with exertion. He is on optimal medical therapy. He did an Ecg which was unchanged. History of Vfib cardiac arrest and STEMI with extensive stenting of the LAD. Given symptoms I have recommended proceeding with outpatient cardiac cath. Will arrange. He is to let me know if symptoms worsen. He will check his own lab work and bring with him.  Buena Boehm [place] MD, [facility]

## 2018-07-20 ENCOUNTER — Telehealth: Payer: Self-pay | Admitting: Cardiology

## 2018-07-20 NOTE — Telephone Encounter (Signed)
  Patient is calling back wanting to cancel his appt for the cath lab. Please contact him to discuss.

## 2018-07-20 NOTE — Telephone Encounter (Signed)
Well we will have to wait and see how I am next week. I am scheduled in the lab Thursday and Friday so God willing I will be back. If his symptoms increase he should go ahead and get it done.  Leea Rambeau [place] MD, [facility]

## 2018-07-20 NOTE — Telephone Encounter (Signed)
Follow up: ° ° ° °Patient returning your call back concering his appt. Please call patient back. °

## 2018-07-20 NOTE — Telephone Encounter (Signed)
Patient called back- he does not want another doctor doing his procedure- he wants to wait until Dr.Jordan is able to do it, as he is not critically ill. I advised I would route this message to [facility], and make her aware, so she can do what is needed to cancel the appointment.  Thank you!

## 2018-07-20 NOTE — Telephone Encounter (Signed)
Patient is calling to find out what time his procedure is on Thursday.

## 2018-07-20 NOTE — Telephone Encounter (Signed)
Called patient, advised of procedure time and date.  Patient verbalized understanding.

## 2018-07-20 NOTE — Telephone Encounter (Signed)
Spoke to patient this morning and advised him Dr.Jordan out this week due to sickness.Advised Dr.Christopher End will be doing his cardiac cath Thurs 5/7.Patient calling back requesting Dr.Jordan only.He is willing to wait and reschedule with Dr.Jordan.Message sent to Dr.Jordan

## 2018-07-20 NOTE — Telephone Encounter (Signed)
Spoke to Patrick Gordon in cath lab 5/7 cath cancelled and rescheduled with Dr.Jordan 07/30/18 at 9:00 am Arrive at 7:00 am.

## 2018-07-21 NOTE — Telephone Encounter (Signed)
Spoke to patient advised cardiac cath for 5/7 cancelled and rescheduled to 07/30/18 with Dr.Jordan at 9:00 am Arrive at new main entrance at 7:00 am. Patient stated he has already had pre cath labs,ekg,and negative covid test.Follow up virtual visit scheduled with Dr.Jordan 6/10 at 10:00 am.

## 2018-07-26 ENCOUNTER — Telehealth: Payer: Self-pay | Admitting: *Deleted

## 2018-07-26 NOTE — Telephone Encounter (Signed)
I called patient to ask him about pre procedure lab prior to [facility] 07/30/18 Dr [place], see phone note 07/20/18.  LMTCB to discuss.

## 2018-07-26 NOTE — Telephone Encounter (Signed)
LMTCB for pt at work number listed [phone] per pt's request.

## 2018-07-26 NOTE — Telephone Encounter (Signed)
Lab work 07/08/18 scanned to Epic, pt scheduled for Covid-19 [facility] 07/27/18 prior to [facility] 07/30/18.

## 2018-07-27 ENCOUNTER — Other Ambulatory Visit
Admission: RE | Admit: 2018-07-27 | Discharge: 2018-07-27 | Disposition: A | Discharge status: Home / Self Care | Payer: Medicare Other | Source: Ambulatory Visit | Attending: Cardiology | Admitting: Cardiology

## 2018-07-27 ENCOUNTER — Other Ambulatory Visit: Payer: Self-pay

## 2018-07-27 DIAGNOSIS — Z1159 Encounter for screening for other viral diseases: Secondary | ICD-10-CM | POA: Insufficient documentation

## 2018-07-28 LAB — NOVEL CORONAVIRUS, NAA (HOSP ORDER, SEND-OUT TO REF LAB; TAT 18-24 HRS): SARS-CoV-2, NAA: NOT DETECTED

## 2018-07-29 ENCOUNTER — Telehealth: Payer: Self-pay | Admitting: Cardiology

## 2018-07-29 NOTE — Telephone Encounter (Signed)
Follow Up:      Returning your call from today. 

## 2018-07-29 NOTE — Telephone Encounter (Addendum)
Pt contacted pre-catheterization scheduled at [facility] for: Friday Jul 30, 2018 9 AM Verified arrival time and place: [facility] Main Entrance A at: 7AM Covid-19 07/27/18 not detected  No solid food after midnight prior to cath, clear liquids until 5 AM day of procedure. Contrast allergy: no  AM meds can be  taken pre-cath with sip of water including: ASA 81 mg Brilinta 60 mg   Confirmed patient has responsible person to drive home post procedure and observe 24 hours after arriving home: yes   Pt advised due to Covid-19 pandemic, [facility] is restricting visitors and only patients should present for check-in prior to their procedure. People will not be allowed to enter [facility] with the patient. At this time [facility] is not allowing visitors to all [facility] campuses.    Per Dr Cato Mulligan Daily Covid Update 07/27/18: Universal masks: Effective immediately, we are requiring all ED patients and all ambulatory care patients to wear a universal mask, which we will provide. In-patients will have a mask at their bedside and will only be asked to put it on when a caregiver comes into their room.         COVID-19 Pre-Screening Questions:  . In the past 7 to 10 days have you had a cough,  shortness of breath, headache, congestion, fever, body aches, chills, sore throat, or sudden loss of taste or sense of smell? no . Have you been around anyone with known Covid 19? no . Have you been around anyone who is awaiting Covid 19 test results in the past 7 to 10 days? no . Have you been around anyone who has been exposed to Covid 19, or has mentioned symptoms of Covid 19 within the past 7 to 10 days? no  I reviewed procedure instructions, visitor/mask, Covid-19 screening questions with patient, he verbalized understanding.

## 2018-07-29 NOTE — Telephone Encounter (Signed)
Spoke to patient he stated he has decided to have cardiac cath tomorrow with Dr.McAlhany.

## 2018-07-29 NOTE — Telephone Encounter (Signed)
LMTCB to review procedure instructions 

## 2018-07-29 NOTE — Telephone Encounter (Signed)
New Message    Pt is calling to speak with Cheryl     Please call back  

## 2018-07-29 NOTE — Telephone Encounter (Signed)
Follow  Up ° ° ° ° °Pt is returning call ° ° ° °Please call back  °

## 2018-07-30 ENCOUNTER — Other Ambulatory Visit: Payer: Self-pay

## 2018-07-30 ENCOUNTER — Ambulatory Visit (HOSPITAL_COMMUNITY)
Admission: RE | Admit: 2018-07-30 | Discharge: 2018-07-30 | Disposition: A | Discharge status: Home / Self Care | Payer: Medicare Other | Attending: Cardiovascular Disease | Admitting: Cardiovascular Disease

## 2018-07-30 ENCOUNTER — Encounter (HOSPITAL_COMMUNITY)
Admission: RE | Disposition: A | Discharge status: Home / Self Care | Payer: Self-pay | Source: Home / Self Care | Attending: Cardiovascular Disease

## 2018-07-30 DIAGNOSIS — Z79899 Other long term (current) drug therapy: Secondary | ICD-10-CM | POA: Diagnosis not present

## 2018-07-30 DIAGNOSIS — I2510 Atherosclerotic heart disease of native coronary artery without angina pectoris: Secondary | ICD-10-CM | POA: Diagnosis not present

## 2018-07-30 DIAGNOSIS — Z885 Allergy status to narcotic agent status: Secondary | ICD-10-CM | POA: Diagnosis not present

## 2018-07-30 DIAGNOSIS — F419 Anxiety disorder, unspecified: Secondary | ICD-10-CM | POA: Insufficient documentation

## 2018-07-30 DIAGNOSIS — R0609 Other forms of dyspnea: Secondary | ICD-10-CM

## 2018-07-30 DIAGNOSIS — Z955 Presence of coronary angioplasty implant and graft: Secondary | ICD-10-CM | POA: Insufficient documentation

## 2018-07-30 DIAGNOSIS — I200 Unstable angina: Secondary | ICD-10-CM

## 2018-07-30 DIAGNOSIS — I252 Old myocardial infarction: Secondary | ICD-10-CM | POA: Insufficient documentation

## 2018-07-30 DIAGNOSIS — E785 Hyperlipidemia, unspecified: Secondary | ICD-10-CM | POA: Diagnosis not present

## 2018-07-30 DIAGNOSIS — E119 Type 2 diabetes mellitus without complications: Secondary | ICD-10-CM | POA: Diagnosis not present

## 2018-07-30 DIAGNOSIS — Z882 Allergy status to sulfonamides status: Secondary | ICD-10-CM | POA: Diagnosis not present

## 2018-07-30 DIAGNOSIS — Z7982 Long term (current) use of aspirin: Secondary | ICD-10-CM | POA: Diagnosis not present

## 2018-07-30 DIAGNOSIS — I25110 Atherosclerotic heart disease of native coronary artery with unstable angina pectoris: Secondary | ICD-10-CM | POA: Diagnosis present

## 2018-07-30 DIAGNOSIS — Z87891 Personal history of nicotine dependence: Secondary | ICD-10-CM | POA: Insufficient documentation

## 2018-07-30 DIAGNOSIS — K219 Gastro-esophageal reflux disease without esophagitis: Secondary | ICD-10-CM | POA: Diagnosis not present

## 2018-07-30 HISTORY — PX: LEFT HEART CATH AND CORONARY ANGIOGRAPHY: CATH118249

## 2018-07-30 SURGERY — LEFT HEART CATH AND CORONARY ANGIOGRAPHY
Anesthesia: LOCAL

## 2018-07-30 MED ORDER — ASPIRIN 81 MG PO CHEW: 81.0000 mg | CHEWABLE_TABLET | ORAL | Status: DC

## 2018-07-30 MED ORDER — LIDOCAINE HCL (PF) 1 % IJ SOLN
INTRAMUSCULAR | Status: AC
  Filled 2018-07-30: qty 30

## 2018-07-30 MED ORDER — FENTANYL CITRATE (PF) 100 MCG/2ML IJ SOLN
INTRAMUSCULAR | Status: AC
  Filled 2018-07-30: qty 2

## 2018-07-30 MED ORDER — HEPARIN (PORCINE) IN NACL 1000-0.9 UT/500ML-% IV SOLN
INTRAVENOUS | Status: DC | PRN
  Administered 2018-07-30 (×2): 500 mL

## 2018-07-30 MED ORDER — SODIUM CHLORIDE 0.9 % WEIGHT BASED INFUSION: 1.0000 mL/kg/h | INTRAVENOUS | Status: DC

## 2018-07-30 MED ORDER — MIDAZOLAM HCL 2 MG/2ML IJ SOLN
INTRAMUSCULAR | Status: AC
  Filled 2018-07-30: qty 2

## 2018-07-30 MED ORDER — HEPARIN SODIUM (PORCINE) 1000 UNIT/ML IJ SOLN
INTRAMUSCULAR | Status: AC
  Filled 2018-07-30: qty 1

## 2018-07-30 MED ORDER — HEPARIN SODIUM (PORCINE) 1000 UNIT/ML IJ SOLN
INTRAMUSCULAR | Status: DC | PRN
  Administered 2018-07-30: 4500 [IU] via INTRAVENOUS

## 2018-07-30 MED ORDER — VERAPAMIL HCL 2.5 MG/ML IV SOLN
INTRAVENOUS | Status: AC
  Filled 2018-07-30: qty 2

## 2018-07-30 MED ORDER — MIDAZOLAM HCL 2 MG/2ML IJ SOLN
INTRAMUSCULAR | Status: DC | PRN
  Administered 2018-07-30: 2 mg via INTRAVENOUS

## 2018-07-30 MED ORDER — SODIUM CHLORIDE 0.9 % WEIGHT BASED INFUSION
3.0000 mL/kg/h | INTRAVENOUS | Status: AC
  Administered 2018-07-30: 3 mL/kg/h via INTRAVENOUS

## 2018-07-30 MED ORDER — SODIUM CHLORIDE 0.9% FLUSH: 3.0000 mL | INTRAVENOUS | Status: DC | PRN

## 2018-07-30 MED ORDER — VERAPAMIL HCL 2.5 MG/ML IV SOLN
INTRAVENOUS | Status: DC | PRN
  Administered 2018-07-30: 10:00:00 10 mL via INTRA_ARTERIAL

## 2018-07-30 MED ORDER — IOHEXOL 350 MG/ML SOLN
INTRAVENOUS | Status: DC | PRN
  Administered 2018-07-30: 11:00:00 84 mL via INTRA_ARTERIAL

## 2018-07-30 MED ORDER — SODIUM CHLORIDE 0.9% FLUSH: 3.0000 mL | Freq: Two times a day (BID) | INTRAVENOUS | Status: DC

## 2018-07-30 MED ORDER — HEPARIN (PORCINE) IN NACL 1000-0.9 UT/500ML-% IV SOLN
INTRAVENOUS | Status: AC
  Filled 2018-07-30: qty 1000

## 2018-07-30 MED ORDER — SODIUM CHLORIDE 0.9 % IV SOLN: 250.0000 mL | INTRAVENOUS | Status: DC | PRN

## 2018-07-30 MED ORDER — FENTANYL CITRATE (PF) 100 MCG/2ML IJ SOLN
INTRAMUSCULAR | Status: DC | PRN
  Administered 2018-07-30: 25 ug via INTRAVENOUS

## 2018-07-30 MED ORDER — LIDOCAINE HCL (PF) 1 % IJ SOLN
INTRAMUSCULAR | Status: DC | PRN
  Administered 2018-07-30: 2 mL

## 2018-07-30 SURGICAL SUPPLY — 10 items
CATH 5FR JL3.5 JR4 ANG PIG MP (CATHETERS) ×1 IMPLANT
DEVICE RAD COMP TR BAND LRG (VASCULAR PRODUCTS) ×1 IMPLANT
GLIDESHEATH SLEND SS 6F .021 (SHEATH) ×1 IMPLANT
GUIDEWIRE INQWIRE 1.5J.035X260 (WIRE) IMPLANT
INQWIRE 1.5J .035X260CM (WIRE) ×2
KIT HEART LEFT (KITS) ×2 IMPLANT
PACK CARDIAC CATHETERIZATION (CUSTOM PROCEDURE TRAY) ×2 IMPLANT
SYR MEDRAD MARK 7 150ML (SYRINGE) ×2 IMPLANT
TRANSDUCER W/STOPCOCK (MISCELLANEOUS) ×2 IMPLANT
TUBING CIL FLEX 10 FLL-RA (TUBING) ×2 IMPLANT

## 2018-07-30 NOTE — Discharge Instructions (Signed)
Radial Site Care ° °This sheet gives you information about how to care for yourself after your procedure. Your health care provider may also give you more specific instructions. If you have problems or questions, contact your health care provider. °What can I expect after the procedure? °After the procedure, it is common to have: °· Bruising and tenderness at the catheter insertion area. °Follow these instructions at home: °Medicines °· Take over-the-counter and prescription medicines only as told by your health care provider. °Insertion site care °· Follow instructions from your health care provider about how to take care of your insertion site. Make sure you: °? Wash your hands with soap and water before you change your bandage (dressing). If soap and water are not available, use hand sanitizer. °? Change your dressing as told by your health care provider. °? Leave stitches (sutures), skin glue, or adhesive strips in place. These skin closures may need to stay in place for 2 weeks or longer. If adhesive strip edges start to loosen and curl up, you may trim the loose edges. Do not remove adhesive strips completely unless your health care provider tells you to do that. °· Check your insertion site every day for signs of infection. Check for: °? Redness, swelling, or pain. °? Fluid or blood. °? Pus or a bad smell. °? Warmth. °· Do not take baths, swim, or use a hot tub until your health care provider approves. °· You may shower 24-48 hours after the procedure, or as directed by your health care provider. °? Remove the dressing and gently wash the site with plain soap and water. °? Pat the area dry with a clean towel. °? Do not rub the site. That could cause bleeding. °· Do not apply powder or lotion to the site. °Activity ° °· For 24 hours after the procedure, or as directed by your health care provider: °? Do not flex or bend the affected arm. °? Do not push or pull heavy objects with the affected arm. °? Do not  drive yourself home from the hospital or clinic. You may drive 24 hours after the procedure unless your health care provider tells you not to. °? Do not operate machinery or power tools. °· Do not lift anything that is heavier than 10 lb (4.5 kg), or the limit that you are told, until your health care provider says that it is safe. °· Ask your health care provider when it is okay to: °? Return to work or school. °? Resume usual physical activities or sports. °? Resume sexual activity. °General instructions °· If the catheter site starts to bleed, raise your arm and put firm pressure on the site. If the bleeding does not stop, get help right away. This is a medical emergency. °· If you went home on the same day as your procedure, a responsible adult should be with you for the first 24 hours after you arrive home. °· Keep all follow-up visits as told by your health care provider. This is important. °Contact a health care provider if: °· You have a fever. °· You have redness, swelling, or yellow drainage around your insertion site. °Get help right away if: °· You have unusual pain at the radial site. °· The catheter insertion area swells very fast. °· The insertion area is bleeding, and the bleeding does not stop when you hold steady pressure on the area. °· Your arm or hand becomes pale, cool, tingly, or numb. °These symptoms may represent a serious problem   that is an emergency. Do not wait to see if the symptoms will go away. Get medical help right away. Call your local emergency services (911 in the [place] Do not drive yourself to the hospital. °Summary °· After the procedure, it is common to have bruising and tenderness at the site. °· Follow instructions from your health care provider about how to take care of your radial site wound. Check the wound every day for signs of infection. °· Do not lift anything that is heavier than 10 lb (4.5 kg), or the limit that you are told, until your health care provider says  that it is safe. °This information is not intended to replace advice given to you by your health care provider. Make sure you discuss any questions you have with your health care provider. °Document Released: 04/05/2010 Document Revised: 04/08/2017 Document Reviewed: 04/08/2017 °Elsevier Interactive Patient Education © 2019 [employer] ° °

## 2018-07-30 NOTE — Interval H&P Note (Signed)
History and Physical Interval Note:  07/30/2018 9:55 AM  Patrick Gordon  has presented today for surgery, with the diagnosis of chest pain.  The various methods of treatment have been discussed with the patient and family. After consideration of risks, benefits and other options for treatment, the patient has consented to  Procedure(s): LEFT HEART CATH AND CORONARY ANGIOGRAPHY (N/A) as a surgical intervention.  The patient's history has been reviewed, patient examined, no change in status, stable for surgery.  I have reviewed the patient's chart and labs.  Questions were answered to the patient's satisfaction.     Cath Lab Visit (complete for each Cath Lab visit)  Clinical Evaluation Leading to the Procedure:   ACS: No.  Non-ACS:    Anginal Classification: CCS III  Anti-ischemic medical therapy: Minimal Therapy (1 class of medications)  Non-Invasive Test Results: No non-invasive testing performed  Prior CABG: No previous CABG        Verne Carrow

## 2018-07-30 NOTE — H&P (Signed)
Cardiology Admission History and Physical:   Patient ID: Patrick Gordon MRN: [id number]; DOB: Jun 19, 1950   Admission date: 07/30/2018  Primary Care Provider: [name], MD Primary Cardiologist: No primary care provider on file. 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 MI in 2014 with drug eluting stents placed in the LAD.    Past Medical History:  Diagnosis Date  . Acute MI anterior wall subsequent episode care (HCC)    2014  . Anxiety   . Borderline diabetes   . CAD (coronary artery disease)   . Diabetes mellitus without complication (HCC)    boraderline  . Dyslipidemia   . Dysrhythmia 2014   V-FIB  . GERD (gastroesophageal reflux disease)   . Thyroid nodule     Past Surgical History:  Procedure Laterality Date  . CARDIAC CATHETERIZATION    . CORONARY ANGIOPLASTY WITH STENT PLACEMENT  2014   2 STENTS IN LAD  . KNEE ARTHROSCOPY Right 12/29/2014   Procedure: ARTHROSCOPY KNEE partial medial menisectomy;  Surgeon: [name], MD;  Location: [facility] ORS;  Service: Orthopedics;  Laterality: Right;  . KNEE ARTHROSCOPY Left 05/09/2016   Procedure: ARTHROSCOPY KNEE, PARTIAL MEDIAL MENISECTOMY;  Surgeon: [name], MD;  Location: [facility] ORS;  Service: Orthopedics;  Laterality: Left;  . LEFT HEART CATHETERIZATION WITH CORONARY ANGIOGRAM N/A 04/22/2012   Procedure: LEFT HEART CATHETERIZATION WITH CORONARY ANGIOGRAM;  Surgeon: [name], MD;  Location: [facility] CATH LAB;  Service: Cardiovascular;  Laterality: N/A;  . LEFT HEART CATHETERIZATION WITH CORONARY ANGIOGRAM N/A 04/22/2013   Procedure: LEFT HEART CATHETERIZATION WITH CORONARY ANGIOGRAM;  Surgeon: [name], MD;  Location: [facility] CATH LAB;  Service: Cardiovascular;  Laterality: N/A;  . MANDIBLE OSTEOTOMY        Medications Prior to Admission: Prior to Admission medications   Medication Sig Start Date End Date Taking? Authorizing Provider  aspirin EC 81 MG tablet Take 81 mg by mouth daily.   Yes [provider]  atorvastatin (LIPITOR) 10 MG tablet Take 1 tablet (10 mg total) by mouth daily. 03/23/15  Yes [place], Peter M, MD  carvedilol (COREG) 12.5 MG tablet Take 1 tablet (12.5 mg total) by mouth 2 (two) times daily. 03/23/15  Yes [place], Peter M, MD  enalapril (VASOTEC) 2.5 MG tablet Take 1 tablet (2.5 mg total) by mouth 2 (two) times daily. 03/23/15  Yes [place], Peter M, MD  ticagrelor (BRILINTA) 60 MG TABS tablet Take 1 tablet (60 mg total) by mouth 2 (two) times daily. 03/23/15  Yes [place], Peter M, MD     Allergies:    Allergies  Allergen Reactions  . Morphine And Related Nausea And Vomiting  . Sulfa Antibiotics Rash    "FIXED DRUG ERUPTION"    Social History:   Social History   Socioeconomic History  . Marital status: Married    Spouse name: Not on file  . Number of children: Not on file  . Years of education: Not on file  . Highest education level: Not on file  Occupational History  . Occupation: MD    Comment: [employer]  Social Needs  . Financial resource strain: Not on file  . Food insecurity:    Worry: Not on file    Inability: Not on file  .  Transportation needs:    Medical: Not on file    Non-medical: Not on file  Tobacco Use  . Smoking status: Former Smoker    Types: Cigars    Last attempt to quit: 12/26/2012    Years since quitting: 5.5  . Smokeless tobacco: Never Used  Substance and Sexual Activity  . Alcohol use: Yes    Alcohol/week: 7.0 standard drinks    Types: 7 Glasses of wine per week    Comment: one glass of wine per day  . Drug use: No  . Sexual activity: Not on file  Lifestyle  . Physical activity:    Days per week: Not on file    Minutes per session: Not on file  . Stress: Not on file  Relationships  . Social  connections:    Talks on phone: Not on file    Gets together: Not on file    Attends religious service: Not on file    Active member of club or organization: Not on file    Attends meetings of clubs or organizations: Not on file    Relationship status: Not on file  . Intimate partner violence:    Fear of current or ex partner: Not on file    Emotionally abused: Not on file    Physically abused: Not on file    Forced sexual activity: Not on file  Other Topics Concern  . Not on file  Social History Narrative  . Not on file    Family History:   The patient's family history includes COPD in his father; Congestive Heart Failure in his father; Diabetes in his father; Hypertension in his father and mother; Stroke in his mother.    ROS:  Please see the history of present illness.  All other ROS reviewed and negative.     Physical Exam/Data:   Vitals:   07/30/18 0704  BP: 125/74  Pulse: (!) 58  Resp: 18  Temp: 97.6 F (36.4 C)  TempSrc: Oral  SpO2: 96%  Weight: 90.7 kg  Height: 5\' 9"  (1.753 m)   No intake or output data in the 24 hours ending 07/30/18 0745 Last 3 Weights 07/30/2018 04/16/2018 08/14/2017  Weight (lbs) 200 lb 202 lb 9.6 oz 192 lb 12.8 oz  Weight (kg) 90.719 kg 91.899 kg 87.454 kg     Body mass index is 29.53 kg/m.  General:  Well nourished, well developed, in no acute distress HEENT: normal Lymph: no adenopathy Neck: no JVD Endocrine:  No thryomegaly Vascular: No carotid bruits; FA pulses 2+ bilaterally without bruits  Cardiac:  normal S1, S2; RRR; no murmur  Lungs:  clear to auscultation bilaterally, no wheezing, rhonchi or rales  Abd: soft, nontender, no hepatomegaly  Ext: no edema Musculoskeletal:  No deformities, BUE and BLE strength normal and equal Skin: warm and dry  Neuro:  CNs 2-12 intact, no focal abnormalities noted Psych:  Normal affect    Laboratory Data:  ChemistryNo results for input(s): NA, K, CL, CO2, GLUCOSE, BUN, CREATININE,  CALCIUM, GFRNONAA, GFRAA, ANIONGAP in the last 168 hours.  No results for input(s): PROT, ALBUMIN, AST, ALT, ALKPHOS, BILITOT in the last 168 hours. HematologyNo results for input(s): WBC, RBC, HGB, HCT, MCV, MCH, MCHC, RDW, PLT in the last 168 hours. Cardiac EnzymesNo results for input(s): TROPONINI in the last 168 hours. No results for input(s): TROPIPOC in the last 168 hours.  BNPNo results for input(s): BNP, PROBNP in the last 168 hours.  DDimer No results for input(s):  DDIMER in the last 168 hours.  Radiology/Studies:  No results found.  Assessment and Plan:   1. CAD with unstable angina: plan cardiac cath today.    For questions or updates, please contact [facility] Please consult [email or web address] for contact info under        Signed, Verne Carrowhristopher Clete Kuch, MD  07/30/2018 7:45 AM

## 2018-08-02 ENCOUNTER — Encounter (HOSPITAL_COMMUNITY): Payer: Self-pay | Admitting: Cardiovascular Disease

## 2018-08-03 ENCOUNTER — Encounter: Payer: Self-pay | Admitting: Endocrinology

## 2018-08-19 ENCOUNTER — Telehealth: Payer: Self-pay | Admitting: Cardiology

## 2018-08-19 NOTE — Telephone Encounter (Signed)
smartphone/ consent/ my chart declined/ pre reg completed  °

## 2018-08-23 NOTE — Progress Notes (Signed)
Virtual Visit via Telephone Note   This visit type was conducted due to national recommendations for restrictions regarding the COVID-19 Pandemic (e.g. social distancing) in an effort to limit this patient's exposure and mitigate transmission in our community.  Due to his co-morbid illnesses, this patient is at least at moderate risk for complications without adequate follow up.  This format is felt to be most appropriate for this patient at this time.  The patient did not have access to video technology/had technical difficulties with video requiring transitioning to audio format only (telephone).  All issues noted in this document were discussed and addressed.  No physical exam could be performed with this format.  Please refer to the patient's chart for his  consent to telehealth for [facility].   Date:  08/23/2018   ID:  Patrick Gordon, DOB Nov 11, 1950, MRN [id number]  Patient Location: Home Provider Location: Home  PCP:  Corky DownsMasoud, Javed, MD  Cardiologist:  Aundria Bitterman [place] MD Electrophysiologist:  None   Evaluation Performed:  Follow-Up Visit  Chief Complaint:  Follow up post cardiac cath  History of Present Illness:    Patrick Gordon is a 68 y.o. male seen today for follow up after recent cardiac cath. He is s/p Anterior STEMI associated with Vfib arrest on 04/22/2012. He underwent emergent stenting of the proximal to mid LAD with 2 long drug-eluting stents. Ejection fraction at cath was 25-30%. Echocardiogram demonstrated an ejection fraction 35%.Subsequent evaluation in [place] showed an EF 51% with small apical defect. He was concerned enough about his cardiac status that he underwent repeat cardiac cath on Feb. 6, 2015. This showed nonobstructive CAD with patent stents. EF was 50-55% with apical wall motion abnormality.   This spring he complained of symptoms of dyspnea on exertion and some chest pressure. This led to a cardiac cath recently showing nonobstructive CAD and  good patency of LAD stents. Good LV function. EDP was normal.  He has done well since his cardiac cath. No access site complications. He has resumed exercise and work. Feels well. He has been diagnosed with extensive periodontal disease and is planning to have laser procedure in [place] on June 23.   The patient does not have symptoms concerning for COVID-19 infection (fever, chills, cough, or new shortness of breath).    Past Medical History:  Diagnosis Date  . Acute MI anterior wall subsequent episode care (HCC)    2014  . Anxiety   . Borderline diabetes   . CAD (coronary artery disease)   . Diabetes mellitus without complication (HCC)    boraderline  . Dyslipidemia   . Dysrhythmia 2014   V-FIB  . GERD (gastroesophageal reflux disease)   . Thyroid nodule    Past Surgical History:  Procedure Laterality Date  . CARDIAC CATHETERIZATION    . CORONARY ANGIOPLASTY WITH STENT PLACEMENT  2014   2 STENTS IN LAD  . KNEE ARTHROSCOPY Right 12/29/2014   Procedure: ARTHROSCOPY KNEE partial medial menisectomy;  Surgeon: Donato HeinzJames P Hooten, MD;  Location: [facility] ORS;  Service: Orthopedics;  Laterality: Right;  . KNEE ARTHROSCOPY Left 05/09/2016   Procedure: ARTHROSCOPY KNEE, PARTIAL MEDIAL MENISECTOMY;  Surgeon: Donato HeinzJames P Hooten, MD;  Location: [facility] ORS;  Service: Orthopedics;  Laterality: Left;  . LEFT HEART CATH AND CORONARY ANGIOGRAPHY N/A 07/30/2018   Procedure: LEFT HEART CATH AND CORONARY ANGIOGRAPHY;  Surgeon: Kathleene HazelMcAlhany, Christopher D, MD;  [facility];  Service: Cardiovascular;  Laterality: N/A;  . LEFT HEART CATHETERIZATION  WITH CORONARY ANGIOGRAM N/A 04/22/2012   Procedure: LEFT HEART CATHETERIZATION WITH CORONARY ANGIOGRAM;  Surgeon: Anuja Manka M Martinique, MD;  Location: [facility] CATH LAB;  Service: Cardiovascular;  Laterality: N/A;  . LEFT HEART CATHETERIZATION WITH CORONARY ANGIOGRAM N/A 04/22/2013   Procedure: LEFT HEART CATHETERIZATION WITH CORONARY ANGIOGRAM;  Surgeon: Jaleen Grupp M Martinique,  MD;  Location: [facility] CATH LAB;  Service: Cardiovascular;  Laterality: N/A;  . MANDIBLE OSTEOTOMY       No outpatient medications have been marked as taking for the 08/25/18 encounter (Appointment) with Martinique, Tracie Dore M, MD.     Allergies:   Morphine and related and Sulfa antibiotics   Social History   Tobacco Use  . Smoking status: Former Smoker    Types: Cigars    Last attempt to quit: 12/26/2012    Years since quitting: 5.6  . Smokeless tobacco: Never Used  Substance Use Topics  . Alcohol use: Yes    Alcohol/week: 7.0 standard drinks    Types: 7 Glasses of wine per week    Comment: one glass of wine per day  . Drug use: No     Family Hx: The patient's family history includes COPD in his father; Congestive Heart Failure in his father; Diabetes in his father; Hypertension in his father and mother; Stroke in his mother.  ROS:   Please see the history of present illness.    All other systems reviewed and are negative.   Prior CV studies:   The following studies were reviewed today:  Cardiac cath 07/30/18:  LEFT HEART CATH AND CORONARY ANGIOGRAPHY  Conclusion     Prox RCA lesion is 40% stenosed.  Mid RCA lesion is 20% stenosed.  2nd Mrg lesion is 20% stenosed.  Prox LAD to Mid LAD lesion is 10% stenosed.  The left ventricular systolic function is normal.  LV end diastolic pressure is normal.  The left ventricular ejection fraction is greater than 65% by visual estimate.  There is no mitral valve regurgitation.   1. Patent stent proximal and mid LAD with minimal restenosis.  2. Mild non-obstructive disease in the Circumflex system 3. Moderate non-obstructive disease in the mid RCA. This does not appear to be flow limiting.  4. Preserved LV systolic function with apical hypokinesis  Recommendations: Medical management of CAD      Labs/Other Tests and Data Reviewed:    EKG:  An ECG dated 06/30/18 was personally reviewed today and demonstrated:  NSR with old  septal infarct  Recent Labs: No results found for requested labs within last 8760 hours.   Recent Lipid Panel Lab Results  Component Value Date/Time   CHOL 141 04/22/2012 06:00 PM   TRIG 82 04/22/2012 06:00 PM   HDL 53 04/22/2012 06:00 PM   CHOLHDL 2.7 04/22/2012 06:00 PM   LDLCALC 72 04/22/2012 06:00 PM   Dated 07/09/18: glucose 136, otherwise CMET normal. Uric acid normal. Cholesterol 99, triglycerides 76, HDL 40, LDL 44. CBC and TSH normal. A1c 5.4%.   Wt Readings from Last 3 Encounters:  07/30/18 200 lb (90.7 kg)  04/16/18 202 lb 9.6 oz (91.9 kg)  08/14/17 192 lb 12.8 oz (87.5 kg)     Objective:    Vital Signs:  There were no vitals taken for this visit.   VITAL SIGNS:  reviewed  ASSESSMENT & PLAN:    1. Anterior STEMI complicated by ventricular fibrillation arrest in 2014. Status post DES x2 to the proximal and mid LAD in February 2014. Some recent symptoms of dyspnea  on exertion. Repeat cardiac recently  showed nonobstructive disease and EF was good.   Continue DAPT with reduced dose Brilinta indefinitely as long as no bleeding problems. Follow up in 6-8 months.  2. Ischemic cardiomyopathy. Initial Ejection fraction 25-35%. Now improved to normal based on last Echo and recent cardiac cath. Continue current doses of ACE inhibitor and carvedilol.  3. Dyslipidemia. Continue statin therapy. Dietary modification. LDL excellent.  4. Prediabetes. Controlled. A1c 5.4%  5. Periodontal disease. He is cleared from our standpoint for this procedure. Does not require SBE prophylaxis from our standpoint. May continue antiplatelet therapy unless bleeding risk is felt to be high.   COVID-19 Education: The signs and symptoms of COVID-19 were discussed with the patient and how to seek care for testing (follow up with PCP or arrange E-visit).  The importance of social distancing was discussed today.  Time:   Today, I have spent 10 minutes with the patient with telehealth technology  discussing the above problems.     Medication Adjustments/Labs and Tests Ordered: Current medicines are reviewed at length with the patient today.  Concerns regarding medicines are outlined above.   Tests Ordered: No orders of the defined types were placed in this encounter.   Medication Changes: No orders of the defined types were placed in this encounter.   Disposition:  Follow up in 8 month(s)  Signed, Jansen Goodpasture [place], MD  08/23/2018 7:23 AM    [facility]

## 2018-08-25 ENCOUNTER — Encounter: Payer: Self-pay | Admitting: Cardiology

## 2018-08-25 ENCOUNTER — Telehealth (INDEPENDENT_AMBULATORY_CARE_PROVIDER_SITE_OTHER): Payer: Medicare Other | Admitting: Cardiology

## 2018-08-25 ENCOUNTER — Telehealth: Payer: Self-pay | Admitting: Cardiology

## 2018-08-25 VITALS — BP 112/76 | HR 74 | Ht 68.0 in | Wt 195.0 lb

## 2018-08-25 DIAGNOSIS — I252 Old myocardial infarction: Secondary | ICD-10-CM

## 2018-08-25 DIAGNOSIS — I2510 Atherosclerotic heart disease of native coronary artery without angina pectoris: Secondary | ICD-10-CM

## 2018-08-25 DIAGNOSIS — E785 Hyperlipidemia, unspecified: Secondary | ICD-10-CM

## 2018-08-25 NOTE — Telephone Encounter (Signed)
New Message      Patrick Gordon is calling from [facility] and says she is awaiting a fax from [place]. She said their machine was offline and she needs it resent please   Fax [phone]

## 2018-08-25 NOTE — Patient Instructions (Signed)
Medication Instructions:  Continue same medications If you need a refill on your cardiac medications before your next appointment, please call your pharmacy.   Lab work: None ordered   Testing/Procedures: None ordered  Follow-Up: At [facility], you and your health needs are our priority.  As part of our continuing mission to provide you with exceptional heart care, we have created designated Provider Care Teams.  These Care Teams include your primary Cardiologist (physician) and Advanced Practice Providers (APPs -  Physician Assistants and Nurse Practitioners) who all work together to provide you with the care you need, when you need it. . Schedule follow up appointment in 8 months    Call 3 months before to schedule

## 2018-08-25 NOTE — Telephone Encounter (Signed)
Office note from tele-health visit with dr Martinique today faxed to the number provided.

## 2019-05-05 NOTE — Progress Notes (Signed)
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 He underwent emergent stenting of the proximal to mid LAD with 2 long drug-eluting stents. Ejection fraction at cath was 25-30%. Echocardiogram demonstrated an ejection fraction 35%.Subsequent evaluation in [place] showed an EF 51% with small apical defect. He was concerned enough about his cardiac status that he underwent repeat cardiac cath on Feb. 6, 2015. This showed nonobstructive CAD with patent stents. EF was 50-55% with apical wall motion abnormality.   In May 2020 he complained of symptoms of dyspnea on exertion and some chest pressure. This led to a cardiac cath showing nonobstructive CAD and good patency of LAD stents. Good LV function with EF 50-55%. EDP was normal.  On follow up today he feels well. He denies any dyspnea, chest pain or palpitations. He is not exercising as much last 3 months and has gained weight.   Current Outpatient Medications on File Prior to Visit  Medication Sig Dispense Refill  . aspirin EC 81 MG tablet Take 81 mg by mouth daily.    Marland Kitchen atorvastatin (LIPITOR) 10 MG tablet Take 1 tablet (10 mg total) by mouth daily. 90 tablet 3  . carvedilol (COREG) 12.5 MG tablet Take 1 tablet (12.5 mg total) by mouth 2 (two) times daily. 180 tablet 3  . enalapril (VASOTEC) 2.5 MG tablet Take 1 tablet (2.5 mg total) by mouth 2 (two) times daily. 180 tablet 3  . Fexofenadine HCl (ALLEGRA PO) Take 1 tablet by mouth daily.    . ticagrelor (BRILINTA) 60 MG TABS tablet Take 1 tablet (60 mg total) by mouth 2 (two) times daily. 180 tablet 3   No current facility-administered medications on file prior to visit.    Allergies  Allergen Reactions  . Morphine And Related Nausea And Vomiting  . Sulfa Antibiotics Rash    "FIXED DRUG ERUPTION"    Past Medical History:   Diagnosis Date  . Acute MI anterior wall subsequent episode care (HCC)    2014  . Anxiety   . Borderline diabetes   . CAD (coronary artery disease)   . Diabetes mellitus without complication (HCC)    boraderline  . Dyslipidemia   . Dysrhythmia 2014   V-FIB  . GERD (gastroesophageal reflux disease)   . Thyroid nodule     Past Surgical History:  Procedure Laterality Date  . CARDIAC CATHETERIZATION    . CORONARY ANGIOPLASTY WITH STENT PLACEMENT  2014   2 STENTS IN LAD  . KNEE ARTHROSCOPY Right 12/29/2014   Procedure: ARTHROSCOPY KNEE partial medial menisectomy;  Surgeon: [name], MD;  Location: [facility] ORS;  Service: Orthopedics;  Laterality: Right;  . KNEE ARTHROSCOPY Left 05/09/2016   Procedure: ARTHROSCOPY KNEE, PARTIAL MEDIAL MENISECTOMY;  Surgeon: [name], MD;  Location: [facility] ORS;  Service: Orthopedics;  Laterality: Left;  . LEFT HEART CATH AND CORONARY ANGIOGRAPHY N/A 07/30/2018   Procedure: LEFT HEART CATH AND CORONARY ANGIOGRAPHY;  Surgeon: Kathleene Hazel, MD;  [facility];  Service: Cardiovascular;  Laterality: N/A;  . LEFT HEART CATHETERIZATION WITH CORONARY ANGIOGRAM N/A 04/22/2012   Procedure: LEFT HEART CATHETERIZATION WITH CORONARY ANGIOGRAM;  Surgeon: Tyrese Capriotti M [place], MD;  Location: [facility] CATH LAB;  Service: Cardiovascular;  Laterality: N/A;  . LEFT HEART CATHETERIZATION WITH CORONARY ANGIOGRAM N/A 04/22/2013   Procedure: LEFT HEART CATHETERIZATION WITH CORONARY ANGIOGRAM;  Surgeon:  Carsyn Taubman M Martinique, MD;  Location: [facility] CATH LAB;  Service: Cardiovascular;  Laterality: N/A;  . MANDIBLE OSTEOTOMY      Social History   Tobacco Use  Smoking Status Former Smoker  . Types: Cigars  . Quit date: 12/26/2012  . Years since quitting: 6.3  Smokeless Tobacco Never Used    Social History   Substance and Sexual Activity  Alcohol Use Yes  . Alcohol/week: 7.0 standard drinks  . Types: 7 Glasses of wine per week   Comment: one glass of wine per day     Family History  Problem Relation Age of Onset  . Hypertension Mother   . Stroke Mother   . COPD Father   . Diabetes Father   . Hypertension Father   . Congestive Heart Failure Father     Review of Systems: As noted in history of present illness.   All other systems were reviewed and are negative.  Physical Exam: BP 126/70 (BP Location: Left Arm, Patient Position: Sitting, Cuff Size: Large)   Pulse 73   Temp (!) 96.8 F (36 C)   Ht 5\' 8"  (1.727 m)   Wt 211 lb (95.7 kg)   BMI 32.08 kg/m  GENERAL:  Well appearing male in NAD HEENT:  PERRL, EOMI, sclera are clear. Oropharynx is clear. NECK:  No jugular venous distention, carotid upstroke brisk and symmetric, no bruits, no thyromegaly or adenopathy LUNGS:  Clear to auscultation bilaterally CHEST:  Unremarkable HEART:  RRR,  PMI not displaced or sustained,S1 and S2 within normal limits, no S3, no S4: no clicks, no rubs, gr 2/6 systolic murmur at the apex ABD:  Soft, nontender. BS +, no masses or bruits. No hepatomegaly, no splenomegaly EXT:  2 + pulses throughout, no edema, no cyanosis no clubbing SKIN:  Warm and dry.  No rashes NEURO:  Alert and oriented x 3. Cranial nerves II through XII intact. PSYCH:  Cognitively intact    LABORATORY DATA: Labs reviewed from 02/11/16: Normal CMET. gluose 139. cholesterol 105, trig 150, HDL 43, LDL 32. CBC normal. A1c 5.5%. Normal thyroid studies. Dated 05/26/17: Normal CMET. Cholesterol 108, triglycerides 77, HDL 49, LDL 44. TFTs normal. CBC normal. A1c 5%. CRP 0.59. Dated 04/05/18: glucose 121, otherwise chemistries normal. Cholesterol 120, triglycerides 180, HDL 36, LDL 48. TSH and CBC normal. A1c 5.7%.  Dated 07/09/18: glucose 136, otherwise CMET normal. Uric acid normal. Cholesterol 99, triglycerides 76, HDL 40, LDL 44. CBC and TSH normal. A1c 5.4%.   Ecg today shows NSR rate 73. Old septal infarct. I have personally reviewed and interpreted this study.  Echo done 05/26/17: normal LV  function. Mild LVH. LAE. No valvular disease.   Cardiac cath 07/30/18:  LEFT HEART CATH AND CORONARY ANGIOGRAPHY  Conclusion     Prox RCA lesion is 40% stenosed.  Mid RCA lesion is 20% stenosed.  2nd Mrg lesion is 20% stenosed.  Prox LAD to Mid LAD lesion is 10% stenosed.  The left ventricular systolic function is normal.  LV end diastolic pressure is normal.  The left ventricular ejection fraction is greater than 65% by visual estimate.  There is no mitral valve regurgitation.  1. Patent stent proximal and mid LAD with minimal restenosis.  2. Mild non-obstructive disease in the Circumflex system 3. Moderate non-obstructive disease in the mid RCA. This does not appear to be flow limiting.  4. Preserved LV systolic function with apical hypokinesis  Recommendations: Medical management of CAD     Assessment / Plan: 1.  Anterior STEMI complicated by ventricular fibrillation arrest in 2014. Status post DES x2 to the proximal and mid LAD in February 2014. Clinically he is doing well. Repeat cardiac cath in May 2020 showed nonobstructive disease and EF has improved.   He is asymptomatic. Continue DAPT with reduced dose Brilinta indefinitely as long as no bleeding problems. Follow up in one year  2. Ischemic cardiomyopathy. Initial Ejection fraction 25-35%. Now improved to normal based on last Echo.Cardiac cath in May 2020 showed EF 50-55%. Continue current doses of ACE inhibitor and carvedilol.  3. Dyslipidemia. Continue statin therapy. Dietary modification. LDL excellent.  4. Prediabetes. Encourage increased aerobic activity and weight loss.

## 2019-05-06 ENCOUNTER — Encounter: Payer: Self-pay | Admitting: Cardiology

## 2019-05-06 ENCOUNTER — Ambulatory Visit (INDEPENDENT_AMBULATORY_CARE_PROVIDER_SITE_OTHER): Payer: Medicare Other | Admitting: Cardiology

## 2019-05-06 ENCOUNTER — Encounter (INDEPENDENT_AMBULATORY_CARE_PROVIDER_SITE_OTHER): Payer: Self-pay

## 2019-05-06 ENCOUNTER — Other Ambulatory Visit: Payer: Self-pay

## 2019-05-06 VITALS — BP 126/70 | HR 73 | Temp 96.8°F | Ht 68.0 in | Wt 211.0 lb

## 2019-05-06 DIAGNOSIS — I252 Old myocardial infarction: Secondary | ICD-10-CM

## 2019-05-06 DIAGNOSIS — E785 Hyperlipidemia, unspecified: Secondary | ICD-10-CM

## 2019-05-06 DIAGNOSIS — I2510 Atherosclerotic heart disease of native coronary artery without angina pectoris: Secondary | ICD-10-CM | POA: Diagnosis not present

## 2019-12-16 ENCOUNTER — Other Ambulatory Visit: Payer: Self-pay | Admitting: Internal Medicine

## 2019-12-16 ENCOUNTER — Ambulatory Visit: Payer: Medicare Other | Attending: Internal Medicine

## 2019-12-16 DIAGNOSIS — Z23 Encounter for immunization: Secondary | ICD-10-CM

## 2019-12-16 NOTE — Progress Notes (Signed)
° °  Covid-19 Vaccination Clinic  Name:  YSIDRO RAMSAY    MRN: [id number] DOB: 1950-05-09  12/16/2019  Mr. Admire was observed post Covid-19 immunization for 15 minutes without incident. He was provided with Vaccine Information Sheet and instruction to access the V-Safe system.   Mr. Husak was instructed to call 911 with any severe reactions post vaccine:  Difficulty breathing   Swelling of face and throat   A fast heartbeat   A bad rash all over body   Dizziness and weakness

## 2020-01-02 ENCOUNTER — Telehealth: Payer: Self-pay | Admitting: Cardiology

## 2020-01-02 NOTE — Telephone Encounter (Signed)
Patient c/o Palpitations:  High priority if patient c/o lightheadedness, shortness of breath, or chest pain  1) How long have you had palpitations/irregular HR/ Afib? Are you having the symptoms now?   / Palpitations / pressure in his chest started a month ago   2) Are you currently experiencing lightheadedness, SOB or CP? No   3) Do you have a history of afib (atrial fibrillation) or irregular heart rhythm? Yes   4) Have you checked your BP or HR? (document readings if available):  BP-112/15 HR- 70's  5) Are you experiencing any other symptoms? Pt stated he walks everyday and after the 1st mile he starts to feel pressure in his chest / No pain just pressure and Palpitations.  He stated this started about a month ago.  He would like to speak with nurse.   Best number [phone]

## 2020-01-02 NOTE — Telephone Encounter (Addendum)
Spoke to patient he stated he has been having chest pressure after he walks about 1/2 mile with palpitations.Stated he wore a monitor at his office which revealed sinus rhythm,a few PVC's,nothing major.Stated he would like to see Dr.Jordan.Appointment scheduled with Dr.Jordan 10/20 at 11:00 am.He will bring monitor report.

## 2020-01-03 NOTE — Progress Notes (Signed)
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 He is s/p anterior STEMI associated with Vfib arrest on 04/22/2012. He underwent emergent stenting of the proximal to mid LAD with 2 long drug-eluting stents. Ejection fraction at cath was 25-30%. Echocardiogram demonstrated an ejection fraction 35%.Subsequent evaluation in [place] showed an EF 51% with small apical defect. He was concerned enough about his cardiac status that he underwent repeat cardiac cath on Feb. 6, 2015. This showed nonobstructive CAD with patent stents. EF was 50-55% with apical wall motion abnormality.   In May 2020 he complained of symptoms of dyspnea on exertion and some chest pressure. This led to a cardiac cath showing nonobstructive CAD and good patency of LAD stents. Good LV function with EF 50-55%. EDP was normal.  He reports he has been under a lot of stress in the past 2 months. During the day he will experience pressure in his chest associated with a thud. This resolves once he gets home and relaxes with a glass or two of wine. He is still exercising regularly walking 20-25 miles/week. Still notes some heaviness in his chest when he first starts walking that goes away after the first half mile. This is the same as last year and worse in the heat. He does note that he gained over 30 lbs and A1c increased to 6.8%. with diet he has lost weight and got his A1c down to 5.8%.   He wore a Zio patch monitor and this showed rare isolated PACs and PVCs with one PVC couplet. Carotid dopplers showed mild nonobstructive disease on the right ICA. Echo showed normal EF with mild AS mean gradient 11 mm Hg.    Current Outpatient Medications on File Prior to Visit  Medication Sig Dispense Refill   aspirin EC 81 MG tablet Take 81 mg by mouth daily.     atorvastatin (LIPITOR) 10 MG tablet Take 1 tablet (10 mg total) by mouth daily.  90 tablet 3   carvedilol (COREG) 12.5 MG tablet Take 1 tablet (12.5 mg total) by mouth 2 (two) times daily. 180 tablet 3   enalapril (VASOTEC) 2.5 MG tablet Take 1 tablet (2.5 mg total) by mouth 2 (two) times daily. 180 tablet 3   Fexofenadine HCl (ALLEGRA PO) Take 1 tablet by mouth daily.     ticagrelor (BRILINTA) 60 MG TABS tablet Take 1 tablet (60 mg total) by mouth 2 (two) times daily. 180 tablet 3   No current facility-administered medications on file prior to visit.    Allergies  Allergen Reactions   Morphine And Related Nausea And Vomiting   Sulfa Antibiotics Rash    "FIXED DRUG ERUPTION"    Past Medical History:  Diagnosis Date   Acute MI anterior wall subsequent episode care [facility])    2014   Anxiety    Borderline diabetes    CAD (coronary artery disease)    Diabetes mellitus without complication (HCC)    boraderline   Dyslipidemia    Dysrhythmia 2014   V-FIB   GERD (gastroesophageal reflux disease)    Thyroid nodule     Past Surgical History:  Procedure Laterality Date   CARDIAC CATHETERIZATION     CORONARY ANGIOPLASTY WITH STENT PLACEMENT  2014   2 STENTS IN LAD   KNEE ARTHROSCOPY Right 12/29/2014   Procedure: ARTHROSCOPY KNEE partial medial menisectomy;  Surgeon: [name], MD;  Location: [facility] ORS;  Service: Orthopedics;  Laterality: Right;   KNEE ARTHROSCOPY Left 05/09/2016   Procedure: ARTHROSCOPY KNEE, PARTIAL MEDIAL MENISECTOMY;  Surgeon: [name], MD;  Location: [facility] ORS;  Service: Orthopedics;  Laterality: Left;   LEFT HEART CATH AND CORONARY ANGIOGRAPHY N/A 07/30/2018   Procedure: LEFT HEART CATH AND CORONARY ANGIOGRAPHY;  Surgeon: Kathleene Hazel, MD;  Location: [facility] INV[facility];  Service: Cardiovascular;  Laterality: N/A;   LEFT HEART CATHETERIZATION WITH CORONARY ANGIOGRAM N/A 04/22/2012   Procedure: LEFT HEART CATHETERIZATION WITH CORONARY ANGIOGRAM;  Surgeon: Tayra Dawe M [place], MD;  Location: [facility] CATH LAB;  Service:  Cardiovascular;  Laterality: N/A;   LEFT HEART CATHETERIZATION WITH CORONARY ANGIOGRAM N/A 04/22/2013   Procedure: LEFT HEART CATHETERIZATION WITH CORONARY ANGIOGRAM;  Surgeon: Sonia Stickels M [place], MD;  Location: [facility] CATH LAB;  Service: Cardiovascular;  Laterality: N/A;   MANDIBLE OSTEOTOMY      Social History   Tobacco Use  Smoking Status Former Smoker   Types: Cigars   Quit date: 12/26/2012   Years since quitting: 7.0  Smokeless Tobacco Never Used    Social History   Substance and Sexual Activity  Alcohol Use Yes   Alcohol/week: 7.0 standard drinks   Types: 7 Glasses of wine per week   Comment: one glass of wine per day    Family History  Problem Relation Age of Onset   Hypertension Mother    Stroke Mother    COPD Father    Diabetes Father    Hypertension Father    Congestive Heart Failure Father     Review of Systems: As noted in history of present illness.   All other systems were reviewed and are negative.  Physical Exam: BP 135/83    Pulse (!) 56    Ht 5\' 8"  (1.727 m)    Wt 202 lb (91.6 kg)    SpO2 96%    BMI 30.71 kg/m  GENERAL:  Well appearing male in NAD HEENT:  PERRL, EOMI, sclera are clear. Oropharynx is clear. NECK:  No jugular venous distention, carotid upstroke brisk and symmetric, no bruits, no thyromegaly or adenopathy LUNGS:  Clear to auscultation bilaterally CHEST:  Unremarkable HEART:  RRR,  PMI not displaced or sustained,S1 and S2 within normal limits, no S3, no S4: no clicks, no rubs, gr 2/6 systolic murmur at the apex/LSB ABD:  Soft, nontender. BS +, no masses or bruits. No hepatomegaly, no splenomegaly EXT:  2 + pulses throughout, no edema, no cyanosis no clubbing SKIN:  Warm and dry.  No rashes NEURO:  Alert and oriented x 3. Cranial nerves II through XII intact. PSYCH:  Cognitively intact    LABORATORY DATA: Labs reviewed from 02/11/16: Normal CMET. gluose 139. cholesterol 105, trig 150, HDL 43, LDL 32. CBC normal. A1c 5.5%. Normal  thyroid studies. Dated 05/26/17: Normal CMET. Cholesterol 108, triglycerides 77, HDL 49, LDL 44. TFTs normal. CBC normal. A1c 5%. CRP 0.59. Dated 04/05/18: glucose 121, otherwise chemistries normal. Cholesterol 120, triglycerides 180, HDL 36, LDL 48. TSH and CBC normal. A1c 5.7%.  Dated 07/09/18: glucose 136, otherwise CMET normal. Uric acid normal. Cholesterol 99, triglycerides 76, HDL 40, LDL 44. CBC and TSH normal. A1c 5.4%.  Dated 07/20/19: glucose 105. Otherwise CMET normal. Cholesterol 71, triglycerides 72, HDL 31, LDL 24. TFTS and CBC normal. A1c 5.4%.   Ecg today shows NSR rate 56. Old septal infarct. I have personally reviewed and interpreted this study.  Echo done 05/26/17: normal LV function. Mild LVH. LAE. No valvular  disease.   Cardiac cath 07/30/18:  LEFT HEART CATH AND CORONARY ANGIOGRAPHY  Conclusion     Prox RCA lesion is 40% stenosed.  Mid RCA lesion is 20% stenosed.  2nd Mrg lesion is 20% stenosed.  Prox LAD to Mid LAD lesion is 10% stenosed.  The left ventricular systolic function is normal.  LV end diastolic pressure is normal.  The left ventricular ejection fraction is greater than 65% by visual estimate.  There is no mitral valve regurgitation.  1. Patent stent proximal and mid LAD with minimal restenosis.  2. Mild non-obstructive disease in the Circumflex system 3. Moderate non-obstructive disease in the mid RCA. This does not appear to be flow limiting.  4. Preserved LV systolic function with apical hypokinesis  Recommendations: Medical management of CAD     Assessment / Plan: 1. CAD with remote Anterior STEMI complicated by ventricular fibrillation arrest in 2014. Status post DES x2 to the proximal and mid LAD in February 2014. Repeat cardiac cath in May 2020 showed nonobstructive disease and EF has improved.   He is experiencing some chest heaviness with palpitation related to stress. No change in exertional symptoms.  Continue DAPT with reduced  dose Brilinta indefinitely as long as no bleeding problems. He will try sl Ntg to see if this helps his symptoms. If so may want to consider adding Imdur. We also discussed potential therapy for stress including SSRI. We offered follow up stress testing but he wants to wait for now and my clinical suspicion is low for recurrent stenosis at this point. Will let us know if symptoms accelerate.   2. Ischemic cardiomyopathy. Initial Ejection fraction 25-35% with MI. Now improved to normal based on last Echo.Cardiac cath in May 2020 showed EF 50-55%. Continue current doses of ACE inhibitor and carvedilol.  3. Dyslipidemia. Continue statin therapy. Dietary modification. LDL excellent.  4. Prediabetes. Encourage increased aerobic activity and weight loss.   5. Mild AS plan repeat Echo in a year.   Follow up in 4 months.

## 2020-01-04 ENCOUNTER — Ambulatory Visit (INDEPENDENT_AMBULATORY_CARE_PROVIDER_SITE_OTHER): Payer: Medicare Other | Admitting: Cardiology

## 2020-01-04 ENCOUNTER — Other Ambulatory Visit: Payer: Self-pay

## 2020-01-04 ENCOUNTER — Encounter: Payer: Self-pay | Admitting: Cardiology

## 2020-01-04 VITALS — BP 135/83 | HR 56 | Ht 68.0 in | Wt 202.0 lb

## 2020-01-04 DIAGNOSIS — I2510 Atherosclerotic heart disease of native coronary artery without angina pectoris: Secondary | ICD-10-CM

## 2020-01-04 DIAGNOSIS — I25118 Atherosclerotic heart disease of native coronary artery with other forms of angina pectoris: Secondary | ICD-10-CM | POA: Diagnosis not present

## 2020-01-04 DIAGNOSIS — E785 Hyperlipidemia, unspecified: Secondary | ICD-10-CM

## 2020-01-04 DIAGNOSIS — I252 Old myocardial infarction: Secondary | ICD-10-CM | POA: Diagnosis not present

## 2020-01-04 DIAGNOSIS — R002 Palpitations: Secondary | ICD-10-CM | POA: Diagnosis not present

## 2020-01-04 DIAGNOSIS — I350 Nonrheumatic aortic (valve) stenosis: Secondary | ICD-10-CM

## 2020-04-16 NOTE — Progress Notes (Signed)
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 He is s/p anterior STEMI associated with Vfib arrest on 04/22/2012. He underwent emergent stenting of the proximal to mid LAD with 2 long drug-eluting stents. Ejection fraction at cath was 25-30%. Echocardiogram demonstrated an ejection fraction 35%.Subsequent evaluation in [place] showed an EF 51% with small apical defect. He was concerned enough about his cardiac status that he underwent repeat cardiac cath on Feb. 6, 2015. This showed nonobstructive CAD with patent stents. EF was 50-55% with apical wall motion abnormality.   In May 2020 he complained of symptoms of dyspnea on exertion and some chest pressure. This led to a cardiac cath showing nonobstructive CAD and good patency of LAD stents. Good LV function with EF 50-55%. EDP was normal.  He reports he has been under a lot of stress in the past 2 months. During the day he will experience pressure in his chest associated with a thud. This resolves once he gets home and relaxes with a glass or two of wine. He is still exercising regularly walking 20-25 miles/week. Still notes some heaviness in his chest when he first starts walking that goes away after the first half mile. This is the same as last year and worse in the heat. He does note that he gained over 30 lbs and A1c increased to 6.8%. with diet he has lost weight and got his A1c down to 5.8%.   He wore a Zio patch monitor and this showed rare isolated PACs and PVCs with one PVC couplet. Carotid dopplers showed mild nonobstructive disease on the right ICA. Echo showed normal EF with mild AS mean gradient 11 mm Hg.   On follow up today he feels very well. No chest pain, dyspnea, palpitations. Rarely feels a little tightness when he first starts exercising but this goes away. No bleeding. Continues to exercise regularly walking 5 miles.     Current Outpatient Medications on File Prior to Visit  Medication Sig Dispense Refill  . aspirin EC 81 MG tablet Take 81 mg by mouth daily.    Marland Kitchen atorvastatin (LIPITOR) 10 MG tablet Take 1 tablet (10 mg total) by mouth daily. 90 tablet 3  . carvedilol (COREG) 12.5 MG tablet Take 1 tablet (12.5 mg total) by mouth 2 (two) times daily. 180 tablet 3  . enalapril (VASOTEC) 2.5 MG tablet Take 1 tablet (2.5 mg total) by mouth 2 (two) times daily. 180 tablet 3  . Fexofenadine HCl (ALLEGRA PO) Take 1 tablet by mouth daily.    . ticagrelor (BRILINTA) 60 MG TABS tablet Take 1 tablet (60 mg total) by mouth 2 (two) times daily. 180 tablet 3   No current facility-administered medications on file prior to visit.    Allergies  Allergen Reactions  . Morphine And Related Nausea And Vomiting  . Sulfa Antibiotics Rash    "FIXED DRUG ERUPTION"    Past Medical History:  Diagnosis Date  . Acute MI anterior wall subsequent episode care (HCC)    2014  . Anxiety   . Borderline diabetes   . CAD (coronary artery disease)   . Diabetes mellitus without complication (HCC)    boraderline  . Dyslipidemia   . Dysrhythmia 2014   V-FIB  . GERD (gastroesophageal reflux disease)   . Thyroid nodule     Past Surgical History:  Procedure Laterality Date  . CARDIAC CATHETERIZATION    .  CORONARY ANGIOPLASTY WITH STENT PLACEMENT  2014   2 STENTS IN LAD  . KNEE ARTHROSCOPY Right 12/29/2014   Procedure: ARTHROSCOPY KNEE partial medial menisectomy;  Surgeon: [name], MD;  Location: [facility] ORS;  Service: Orthopedics;  Laterality: Right;  . KNEE ARTHROSCOPY Left 05/09/2016   Procedure: ARTHROSCOPY KNEE, PARTIAL MEDIAL MENISECTOMY;  Surgeon: [name], MD;  Location: [facility] ORS;  Service: Orthopedics;  Laterality: Left;  . LEFT HEART CATH AND CORONARY ANGIOGRAPHY N/A 07/30/2018   Procedure: LEFT HEART CATH AND CORONARY ANGIOGRAPHY;  Surgeon: Kathleene Hazel, MD;  Location: [facility];   Service: Cardiovascular;  Laterality: N/A;  . LEFT HEART CATHETERIZATION WITH CORONARY ANGIOGRAM N/A 04/22/2012   Procedure: LEFT HEART CATHETERIZATION WITH CORONARY ANGIOGRAM;  Surgeon: Mckinlee Dunk M [place], MD;  Location: [facility] CATH LAB;  Service: Cardiovascular;  Laterality: N/A;  . LEFT HEART CATHETERIZATION WITH CORONARY ANGIOGRAM N/A 04/22/2013   Procedure: LEFT HEART CATHETERIZATION WITH CORONARY ANGIOGRAM;  Surgeon: Karin Pinedo M [place], MD;  Location: [facility] CATH LAB;  Service: Cardiovascular;  Laterality: N/A;  . MANDIBLE OSTEOTOMY      Social History   Tobacco Use  Smoking Status Former Smoker  . Types: Cigars  . Quit date: 12/26/2012  . Years since quitting: 7.3  Smokeless Tobacco Never Used    Social History   Substance and Sexual Activity  Alcohol Use Yes  . Alcohol/week: 7.0 standard drinks  . Types: 7 Glasses of wine per week   Comment: one glass of wine per day    Family History  Problem Relation Age of Onset  . Hypertension Mother   . Stroke Mother   . COPD Father   . Diabetes Father   . Hypertension Father   . Congestive Heart Failure Father     Review of Systems: As noted in history of present illness.   All other systems were reviewed and are negative.  Physical Exam: BP 124/72   Pulse 80   Ht 5\' 8"  (1.727 m)   Wt 198 lb 12.8 oz (90.2 kg)   BMI 30.23 kg/m  GENERAL:  Well appearing male in NAD HEENT:  PERRL, EOMI, sclera are clear. Oropharynx is clear. NECK:  No jugular venous distention, carotid upstroke brisk and symmetric, no bruits, no thyromegaly or adenopathy LUNGS:  Clear to auscultation bilaterally CHEST:  Unremarkable HEART:  RRR,  PMI not displaced or sustained,S1 and S2 within normal limits, no S3, no S4: no clicks, no rubs, gr 2/6 systolic murmur at the apex/LSB ABD:  Soft, nontender. BS +, no masses or bruits. No hepatomegaly, no splenomegaly EXT:  2 + pulses throughout, no edema, no cyanosis no clubbing SKIN:  Warm and dry.  No rashes NEURO:  Alert  and oriented x 3. Cranial nerves II through XII intact. PSYCH:  Cognitively intact    LABORATORY DATA: Labs reviewed from 02/11/16: Normal CMET. gluose 139. cholesterol 105, trig 150, HDL 43, LDL 32. CBC normal. A1c 5.5%. Normal thyroid studies. Dated 05/26/17: Normal CMET. Cholesterol 108, triglycerides 77, HDL 49, LDL 44. TFTs normal. CBC normal. A1c 5%. CRP 0.59. Dated 04/05/18: glucose 121, otherwise chemistries normal. Cholesterol 120, triglycerides 180, HDL 36, LDL 48. TSH and CBC normal. A1c 5.7%.  Dated 07/09/18: glucose 136, otherwise CMET normal. Uric acid normal. Cholesterol 99, triglycerides 76, HDL 40, LDL 44. CBC and TSH normal. A1c 5.4%.  Dated 07/20/19: glucose 105. Otherwise CMET normal. Cholesterol 71, triglycerides 72, HDL 31, LDL 24. TFTS and CBC normal. A1c 5.4%.  Echo done 05/26/17: normal LV function. Mild LVH. LAE. No valvular disease.   Cardiac cath 07/30/18:  LEFT HEART CATH AND CORONARY ANGIOGRAPHY  Conclusion     Prox RCA lesion is 40% stenosed.  Mid RCA lesion is 20% stenosed.  2nd Mrg lesion is 20% stenosed.  Prox LAD to Mid LAD lesion is 10% stenosed.  The left ventricular systolic function is normal.  LV end diastolic pressure is normal.  The left ventricular ejection fraction is greater than 65% by visual estimate.  There is no mitral valve regurgitation.  1. Patent stent proximal and mid LAD with minimal restenosis.  2. Mild non-obstructive disease in the Circumflex system 3. Moderate non-obstructive disease in the mid RCA. This does not appear to be flow limiting.  4. Preserved LV systolic function with apical hypokinesis  Recommendations: Medical management of CAD     Assessment / Plan: 1. CAD with remote Anterior STEMI complicated by ventricular fibrillation arrest in 2014. Status post DES x2 to the proximal and mid LAD in February 2014. Repeat cardiac cath in May 2020 showed nonobstructive disease and EF has improved.     Continue  DAPT with reduced dose Brilinta indefinitely as long as no bleeding problems.   2. Ischemic cardiomyopathy. Initial Ejection fraction 25-35% with MI. Now improved to normal based on last Echo.Cardiac cath in May 2020 showed EF 50-55%. Continue current doses of ACE inhibitor and carvedilol.  3. Dyslipidemia. Continue statin therapy. Dietary modification. LDL excellent.  4. Prediabetes. Encourage  aerobic activity and weight loss. Last A1c 5.3%.   5. Mild AS on exam  Follow up in 6 months.

## 2020-04-20 ENCOUNTER — Ambulatory Visit (INDEPENDENT_AMBULATORY_CARE_PROVIDER_SITE_OTHER): Payer: Medicare Other | Admitting: Cardiology

## 2020-04-20 ENCOUNTER — Encounter: Payer: Self-pay | Admitting: Cardiology

## 2020-04-20 ENCOUNTER — Other Ambulatory Visit: Payer: Self-pay

## 2020-04-20 VITALS — BP 124/72 | HR 80 | Ht 68.0 in | Wt 198.8 lb

## 2020-04-20 DIAGNOSIS — E785 Hyperlipidemia, unspecified: Secondary | ICD-10-CM | POA: Diagnosis not present

## 2020-04-20 DIAGNOSIS — I252 Old myocardial infarction: Secondary | ICD-10-CM

## 2020-04-20 DIAGNOSIS — I350 Nonrheumatic aortic (valve) stenosis: Secondary | ICD-10-CM | POA: Diagnosis not present

## 2020-04-20 DIAGNOSIS — I25118 Atherosclerotic heart disease of native coronary artery with other forms of angina pectoris: Secondary | ICD-10-CM | POA: Diagnosis not present

## 2020-08-21 ENCOUNTER — Telehealth: Payer: Self-pay

## 2020-08-21 NOTE — Telephone Encounter (Signed)
Called patient left message on personal voice mail.I received a message from operator you wanted to schedule appointment on a Friday in August with Dr.Jordan.Appointment scheduled Fri 8/26 at 1:30 pm.Advised to call back if that appointment does not work.

## 2020-10-29 LAB — HM DIABETES EYE EXAM

## 2020-11-05 NOTE — Progress Notes (Signed)
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 He is s/p anterior STEMI associated with Vfib arrest on 04/22/2012. He underwent emergent stenting of the proximal to mid LAD with 2 long drug-eluting stents. Ejection fraction at cath was 25-30%. Echocardiogram demonstrated an ejection fraction 35%.Subsequent evaluation in [place] showed an EF 51% with small apical defect. He was concerned enough about his cardiac status that he underwent repeat cardiac cath on Feb. 6, 2015. This showed nonobstructive CAD with patent stents. EF was 50-55% with apical wall motion abnormality.   In May 2020 he complained of symptoms of dyspnea on exertion and some chest pressure. This led to a cardiac cath showing nonobstructive CAD and good patency of LAD stents. Good LV function with EF 50-55%. EDP was normal.  He wore a Zio patch monitor in Sept 2021 and this showed rare isolated PACs and PVCs with one PVC couplet. Carotid dopplers showed mild nonobstructive disease on the right ICA. Echo showed normal EF with mild AS mean gradient 11 mm Hg.   Rarely feels a little tightness when he first starts exercising but this goes away after the first mile. No bleeding. Continues to exercise regularly walking 6 miles. No chest pain. Occasionally still has some palpitations when under stress but has done Ecgs without acute change.    Current Outpatient Medications on File Prior to Visit  Medication Sig Dispense Refill   aspirin EC 81 MG tablet Take 81 mg by mouth daily.     atorvastatin (LIPITOR) 10 MG tablet Take 1 tablet (10 mg total) by mouth daily. 90 tablet 3   carvedilol (COREG) 12.5 MG tablet Take 1 tablet (12.5 mg total) by mouth 2 (two) times daily. 180 tablet 3   COVID-19 mRNA vaccine, Pfizer, 30 MCG/0.3ML injection USE AS DIRECTED .3 mL 0   enalapril (VASOTEC) 2.5 MG tablet Take 1 tablet (2.5 mg total) by mouth 2  (two) times daily. 180 tablet 3   Fexofenadine HCl (ALLEGRA PO) Take 1 tablet by mouth daily.     ticagrelor (BRILINTA) 60 MG TABS tablet Take 1 tablet (60 mg total) by mouth 2 (two) times daily. 180 tablet 3   No current facility-administered medications on file prior to visit.    Allergies  Allergen Reactions   Morphine And Related Nausea And Vomiting   Sulfa Antibiotics Rash    "FIXED DRUG ERUPTION"    Past Medical History:  Diagnosis Date   Acute MI anterior wall subsequent episode care [facility])    2014   Anxiety    Borderline diabetes    CAD (coronary artery disease)    Diabetes mellitus without complication (HCC)    boraderline   Dyslipidemia    Dysrhythmia 2014   V-FIB   GERD (gastroesophageal reflux disease)    Thyroid nodule     Past Surgical History:  Procedure Laterality Date   CARDIAC CATHETERIZATION     CORONARY ANGIOPLASTY WITH STENT PLACEMENT  2014   2 STENTS IN LAD   KNEE ARTHROSCOPY Right 12/29/2014   Procedure: ARTHROSCOPY KNEE partial medial menisectomy;  Surgeon: [name], MD;  Location: [facility] ORS;  Service: Orthopedics;  Laterality: Right;   KNEE ARTHROSCOPY Left 05/09/2016   Procedure: ARTHROSCOPY KNEE, PARTIAL MEDIAL MENISECTOMY;  Surgeon: [name], MD;  Location: [facility] ORS;  Service: Orthopedics;  Laterality: Left;   LEFT HEART CATH AND CORONARY ANGIOGRAPHY N/A 07/30/2018   Procedure:  LEFT HEART CATH AND CORONARY ANGIOGRAPHY;  Surgeon: Kathleene Hazel, MD;  Location: [facility] INV[facility];  Service: Cardiovascular;  Laterality: N/A;   LEFT HEART CATHETERIZATION WITH CORONARY ANGIOGRAM N/A 04/22/2012   Procedure: LEFT HEART CATHETERIZATION WITH CORONARY ANGIOGRAM;  Surgeon: Julez Huseby M Swaz[place], MD;  Location: [facility] CATH LAB;  Service: Cardiovascular;  Laterality: N/A;   LEFT HEART CATHETERIZATION WITH CORONARY ANGIOGRAM N/A 04/22/2013   Procedure: LEFT HEART CATHETERIZATION WITH CORONARY ANGIOGRAM;  Surgeon: Chistine Dematteo M [place], MD;  Location: [facility] CATH LAB;   Service: Cardiovascular;  Laterality: N/A;   MANDIBLE OSTEOTOMY      Social History   Tobacco Use  Smoking Status Former   Types: Cigars   Quit date: 12/26/2012   Years since quitting: 7.8  Smokeless Tobacco Never    Social History   Substance and Sexual Activity  Alcohol Use Yes   Alcohol/week: 7.0 standard drinks   Types: 7 Glasses of wine per week   Comment: one glass of wine per day    Family History  Problem Relation Age of Onset   Hypertension Mother    Stroke Mother    COPD Father    Diabetes Father    Hypertension Father    Congestive Heart Failure Father     Review of Systems: As noted in history of present illness.   All other systems were reviewed and are negative.  Physical Exam: BP 132/64 (BP Location: Left Arm, Patient Position: Sitting, Cuff Size: Normal)   Pulse 61   Ht 5\' 8"  (1.727 m)   Wt 196 lb (88.9 kg)   SpO2 96%   BMI 29.80 kg/m  GENERAL:  Well appearing male in NAD HEENT:  PERRL, EOMI, sclera are clear. Oropharynx is clear. NECK:  No jugular venous distention, carotid upstroke brisk and symmetric, no bruits, no thyromegaly or adenopathy LUNGS:  Clear to auscultation bilaterally CHEST:  Unremarkable HEART:  RRR,  PMI not displaced or sustained,S1 and S2 within normal limits, no S3, no S4: no clicks, no rubs, gr 2/6 systolic murmur at the apex/LSB ABD:  Soft, nontender. BS +, no masses or bruits. No hepatomegaly, no splenomegaly EXT:  2 + pulses throughout, no edema, no cyanosis no clubbing SKIN:  Warm and dry.  No rashes NEURO:  Alert and oriented x 3. Cranial nerves II through XII intact. PSYCH:  Cognitively intact    LABORATORY DATA: Labs reviewed from 02/11/16: Normal CMET. gluose 139. cholesterol 105, trig 150, HDL 43, LDL 32. CBC normal. A1c 5.5%. Normal thyroid studies. Dated 05/26/17: Normal CMET. Cholesterol 108, triglycerides 77, HDL 49, LDL 44. TFTs normal. CBC normal. A1c 5%. CRP 0.59. Dated 04/05/18: glucose 121, otherwise  chemistries normal. Cholesterol 120, triglycerides 180, HDL 36, LDL 48. TSH and CBC normal. A1c 5.7%.  Dated 07/09/18: glucose 136, otherwise CMET normal. Uric acid normal. Cholesterol 99, triglycerides 76, HDL 40, LDL 44. CBC and TSH normal. A1c 5.4%.  Dated 07/20/19: glucose 105. Otherwise CMET normal. Cholesterol 71, triglycerides 72, HDL 31, LDL 24. TFTS and CBC normal. A1c 5.4%.   Ecg today shows NSR rate 61. Old septal infarct. No change. I have personally reviewed and interpreted this study.  Echo done 05/26/17: normal LV function. Mild LVH. LAE. No valvular disease.   Cardiac cath 07/30/18:  LEFT HEART CATH AND CORONARY ANGIOGRAPHY  Conclusion      Prox RCA lesion is 40% stenosed. Mid RCA lesion is 20% stenosed. 2nd Mrg lesion is 20% stenosed. Prox LAD to Mid LAD lesion  is 10% stenosed. The left ventricular systolic function is normal. LV end diastolic pressure is normal. The left ventricular ejection fraction is greater than 65% by visual estimate. There is no mitral valve regurgitation.   1. Patent stent proximal and mid LAD with minimal restenosis.  2. Mild non-obstructive disease in the Circumflex system 3. Moderate non-obstructive disease in the mid RCA. This does not appear to be flow limiting.  4. Preserved LV systolic function with apical hypokinesis   Recommendations: Medical management of CAD       Assessment / Plan: 1. CAD with remote Anterior STEMI complicated by ventricular fibrillation arrest in 2014. Status post DES x2 to the proximal and mid LAD in February 2014. Repeat cardiac cath in May 2020 showed nonobstructive disease and EF improved.     Continue DAPT with reduced dose Brilinta indefinitely as long as no bleeding problems.   2. Ischemic cardiomyopathy. Initial Ejection fraction 25-35% with MI. Now improved to normal based on last Echo.Cardiac cath in May 2020 showed EF 50-55%. Continue current doses of ACE inhibitor and carvedilol.  3. Dyslipidemia.  Continue statin therapy. Dietary modification. He had recent labs and report total cholesterol was 80-90  4. Prediabetes. Encourage  aerobic activity and weight loss. Last A1c 5.4%.   5. Mild AS on exam. Recommend Echo every 3 years.   Follow up in 6 months.

## 2020-11-09 ENCOUNTER — Encounter: Payer: Self-pay | Admitting: Cardiology

## 2020-11-09 ENCOUNTER — Ambulatory Visit: Payer: Medicare Other | Admitting: Cardiology

## 2020-11-09 ENCOUNTER — Other Ambulatory Visit: Payer: Self-pay

## 2020-11-09 ENCOUNTER — Ambulatory Visit (INDEPENDENT_AMBULATORY_CARE_PROVIDER_SITE_OTHER): Payer: Medicare Other | Admitting: Cardiology

## 2020-11-09 VITALS — BP 132/64 | HR 61 | Ht 68.0 in | Wt 196.0 lb

## 2020-11-09 DIAGNOSIS — I252 Old myocardial infarction: Secondary | ICD-10-CM

## 2020-11-09 DIAGNOSIS — I2510 Atherosclerotic heart disease of native coronary artery without angina pectoris: Secondary | ICD-10-CM | POA: Diagnosis not present

## 2020-11-09 DIAGNOSIS — I350 Nonrheumatic aortic (valve) stenosis: Secondary | ICD-10-CM

## 2020-11-09 DIAGNOSIS — E785 Hyperlipidemia, unspecified: Secondary | ICD-10-CM | POA: Diagnosis not present

## 2020-11-13 ENCOUNTER — Encounter: Payer: Self-pay | Admitting: Internal Medicine

## 2020-12-11 ENCOUNTER — Other Ambulatory Visit: Payer: Self-pay | Admitting: General Surgery

## 2020-12-11 NOTE — Progress Notes (Signed)
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   HPI   The following portions of the patient's history were reviewed and updated as appropriate.   This a new patient is here today for: office visit. Patient is here today for evaluation of GERD, reflux, heartburn. He thinks he may need an upper endoscopy. Patient does report he has a hiatal hernia.    The patient had undergone an upper endoscopy in 2015 which showed esophagitis as well as gastritis.  Completed byDarren Servando Snare, MD.  Colonoscopy at the same time showed several small polyps in the ascending colon and sigmoid colon.   The patient had made use of Prilosec for many years and a few months ago decided with his multiple cardiac medicines he could get by without.  He is subsequently had a marked increase in reflux symptoms as well as some episodes of dysphagia.    This is described as trouble swallowing the first bite of the meal, regardless of what he is eating.  He is able to "wash it down" with liquids and afterwards is able to complete his meal without difficulty.   Since resuming Prilosec he has had marked improvement in his reflux symptoms and dysphagia.   The patient reports he has a multinodular goiter, although where his dysphagia symptoms are are retrosternal rather than cervical.   Patient reports he was having abdominal pain in 2019 at the time of his CT scan done through [facility]. He denies any abdominal pain at this time.    Last upper and lower endoscopy was completed by Dr. Servando Snare on 12-02-13. Patient reports he has bowel movements every other day. He denies any blood or mucus in the stools.    The patient reports he has 8 brothers, he is the third oldest.  3 of died from cardiac events.  1 had portal vein thrombosis.  He reports an evaluation with Wyatt Haste, MD in the past without finding of any known clotting abnormalities.        Chief Complaint  Patient presents with   Gerd      BP 120/80   Pulse  80   Temp 36.6 C (97.9 F)   Ht 172.7 cm (5\' 8" )   Wt 83.5 kg (184 lb)   SpO2 98%   BMI 27.98 kg/m        Past Medical History:  Diagnosis Date   Acute MI anterior wall subsequent episode care (CMS-HCC)     Anxiety     CAD (coronary artery disease)     Dyslipidemia     Dysrhythmia 2014    v-fib   GERD (gastroesophageal reflux disease)     History of placement of stent in LAD coronary artery     Prediabetes             Past Surgical History:  Procedure Laterality Date   heart cath and coronary angiography Left 07/30/2018   heart catheterization with coronary angiogram Left 04/22/2013   heart catheterization with coronary angiogram   04/22/2012   Left knee arthroscopy, partial medial meniscectomy   05/09/2016    Dr 05/11/2016   OSTEOTOMY MANDIBLE SEGMENTAL       Right knee arthroscopy, partial medial meniscectomy Right 12/29/2014    Dr 12/31/2014          Social History           Socioeconomic History   Marital status: Married      Spouse name:  Rinaldo Cloud   Number of children: 2   Years of education: MD  Occupational History   Occupation: Physician  Tobacco Use   Smoking status: Former Smoker      Types: Cigars      Quit date: 12/26/2012      Years since quitting: 7.9   Smokeless tobacco: Never Used  Substance and Sexual Activity   Alcohol use: Yes      Alcohol/week: 5.0 - 7.0 standard drinks      Types: 5 - 7 Standard drinks or equivalent per week   Drug use: No   Sexual activity: Yes      Partners: Female            Allergies  Allergen Reactions   Morphine Vomiting   Sulfa (Sulfonamide Antibiotics) Rash      Current Medications        Current Outpatient Medications  Medication Sig Dispense Refill   aspirin 81 MG chewable tablet Take by mouth.       atorvastatin (LIPITOR) 10 MG tablet Take 10 mg by mouth once daily.       carvedilol (COREG) 12.5 MG tablet Take 12.5 mg by mouth 2 (two) times daily with meals.       enalapril (VASOTEC) 2.5 MG tablet  Take 2.5 mg by mouth once daily.       OMEPRAZOLE ORAL Take 40 mg by mouth       ticagrelor 60 mg Tab Take 60 mg by mouth 2 (two) times daily.        No current facility-administered medications for this visit.             Family History  Problem Relation Age of Onset   Stroke Mother     Diabetes type II Mother     High blood pressure (Hypertension) Mother     High blood pressure (Hypertension) Father     Diabetes Father     COPD Father     Heart failure Father          Review of Systems  Constitutional: Negative for chills and fever.  Respiratory: Negative for cough.          Objective:   Physical Exam Constitutional:      Appearance: Normal appearance.  Cardiovascular:     Rate and Rhythm: Normal rate and regular rhythm.     Pulses: Normal pulses.     Heart sounds: Normal heart sounds.  Pulmonary:     Effort: Pulmonary effort is normal.     Breath sounds: Normal breath sounds.  Abdominal:     Hernia: A hernia is present. Hernia is present in the left inguinal area and right inguinal area.  Genitourinary:   Neurological:     Mental Status: He is alert and oriented to person, place, and time.  Psychiatric:        Mood and Affect: Mood normal.        Behavior: Behavior normal.      Labs and Radiology:    Endoscopy reports of 2015 were reviewed.  Pathology was not available.   HIDA scan with half-and-half for a stimulant of June 21, 2019 showed a modestly depressed ejection fraction of 28.6%.  The patient reported no symptoms during the study.   CT of the abdomen and pelvis dated July 03, 2017 was independently reviewed.  Several small calcified stones in the gallbladder.  Bilateral inguinal hernias, right greater than left.  Mild splenomegaly.  No evidence of hiatal hernia  on the study.   Laboratory studies dated December 10, 2020 were reviewed:   Blood sugar 120, normal electrolytes, creatinine 0.98 with an estimated GFR of 84, normal liver function  studies.  Cholesterol of 82, improved from 111.  Low HDL, baseline.  Normal thyroid function study.  Hemoglobin 15.5 with an MCV of 94, white blood cell count of 5500 with normal differential, platelet count of 181,000.  Hemoglobin A1c of 5.5.   April 17, 2020 H. pylori testing for IgG, IgM: Negative.   Cardiac echo of November 27, 2020: Evidence of aortic stenosis with a peak gradient of 24 mmHg.  Calculated ejection fraction of 57%.  Annular calcifications of the aortic valve.  ECG of November 22, 2020 showed evidence of an old inferior infarct 02-Jul-2012)   Jul 30, 2018 catheterization results:   Prox RCA lesion is 40% stenosed. Mid RCA lesion is 20% stenosed. 2nd Mrg lesion is 20% stenosed. Prox LAD to Mid LAD lesion is 10% stenosed. The left ventricular systolic function is normal. LV end diastolic pressure is normal. The left ventricular ejection fraction is greater than 65% by visual estimate. There is no mitral valve regurgitation.   1. Patent stent proximal and mid LAD with minimal restenosis.  2. Mild non-obstructive disease in the Circumflex system 3. Moderate non-obstructive disease in the mid RCA. This does not appear to be flow limiting.  4. Preserved LV systolic function with apical hypokinesis        Assessment:     GERD symptoms, exacerbated with cessation of PPI blockade.   Asymptomatic inguinal hernias.   Prior myocardial infarction managed with stents with residual stenosis of 10%.    Plan:     As the patient experiences retrosternal pain with this recent episode, and his past history of pronounced MI in 02-Jul-2012, it is reasonable to repeat his upper endoscopy and confirm no new pathology.   Will attempt to obtain a copy of his Jul 02, 2013 pathology results considering the multiple polyps described by the attending GI physician.  It will be necessary for the patient to hold his antiplatelet therapy for 5 days prior to the procedure    This note is partially prepared by  Wendall Stade, CMA acting as a scribe in the presence of Dr. Donnalee Curry, MD.    The documentation recorded by the scribe accurately reflects the service I personally performed and the decisions made by me.    Earline Mayotte, MD FACS

## 2020-12-18 ENCOUNTER — Encounter: Payer: Self-pay | Admitting: General Surgery

## 2020-12-19 ENCOUNTER — Encounter: Payer: Self-pay | Admitting: General Surgery

## 2020-12-19 ENCOUNTER — Ambulatory Visit
Admission: RE | Admit: 2020-12-19 | Discharge: 2020-12-19 | Disposition: A | Discharge status: Home / Self Care | Payer: Medicare Other | Attending: General Surgery | Admitting: General Surgery

## 2020-12-19 ENCOUNTER — Ambulatory Visit: Payer: Medicare Other | Admitting: Anesthesiology

## 2020-12-19 ENCOUNTER — Encounter
Admission: RE | Disposition: A | Discharge status: Home / Self Care | Payer: Self-pay | Source: Home / Self Care | Attending: General Surgery

## 2020-12-19 DIAGNOSIS — K2950 Unspecified chronic gastritis without bleeding: Secondary | ICD-10-CM | POA: Diagnosis not present

## 2020-12-19 DIAGNOSIS — Z7982 Long term (current) use of aspirin: Secondary | ICD-10-CM | POA: Diagnosis not present

## 2020-12-19 DIAGNOSIS — Z87891 Personal history of nicotine dependence: Secondary | ICD-10-CM | POA: Insufficient documentation

## 2020-12-19 DIAGNOSIS — R1310 Dysphagia, unspecified: Secondary | ICD-10-CM | POA: Diagnosis present

## 2020-12-19 DIAGNOSIS — Z882 Allergy status to sulfonamides status: Secondary | ICD-10-CM | POA: Insufficient documentation

## 2020-12-19 DIAGNOSIS — E119 Type 2 diabetes mellitus without complications: Secondary | ICD-10-CM | POA: Diagnosis not present

## 2020-12-19 DIAGNOSIS — Z885 Allergy status to narcotic agent status: Secondary | ICD-10-CM | POA: Diagnosis not present

## 2020-12-19 DIAGNOSIS — Z79899 Other long term (current) drug therapy: Secondary | ICD-10-CM | POA: Insufficient documentation

## 2020-12-19 HISTORY — PX: ESOPHAGOGASTRODUODENOSCOPY (EGD) WITH PROPOFOL: SHX5813

## 2020-12-19 LAB — HM COLONOSCOPY

## 2020-12-19 SURGERY — ESOPHAGOGASTRODUODENOSCOPY (EGD) WITH PROPOFOL
Anesthesia: General

## 2020-12-19 MED ORDER — PROPOFOL 500 MG/50ML IV EMUL
INTRAVENOUS | Status: DC | PRN
  Administered 2020-12-19: 150 ug/kg/min via INTRAVENOUS

## 2020-12-19 MED ORDER — PROPOFOL 10 MG/ML IV BOLUS
INTRAVENOUS | Status: DC | PRN
  Administered 2020-12-19: 80 mg via INTRAVENOUS

## 2020-12-19 MED ORDER — LIDOCAINE HCL (CARDIAC) PF 100 MG/5ML IV SOSY
PREFILLED_SYRINGE | INTRAVENOUS | Status: DC | PRN
  Administered 2020-12-19: 100 mg via INTRAVENOUS

## 2020-12-19 MED ORDER — PHENYLEPHRINE HCL (PRESSORS) 10 MG/ML IV SOLN
INTRAVENOUS | Status: AC
  Filled 2020-12-19: qty 1

## 2020-12-19 MED ORDER — SODIUM CHLORIDE 0.9 % IV SOLN
INTRAVENOUS | Status: DC
  Administered 2020-12-19: 1000 mL via INTRAVENOUS

## 2020-12-19 MED ORDER — LIDOCAINE HCL (PF) 2 % IJ SOLN
INTRAMUSCULAR | Status: AC
  Filled 2020-12-19: qty 15

## 2020-12-19 MED ORDER — PROPOFOL 500 MG/50ML IV EMUL
INTRAVENOUS | Status: AC
  Filled 2020-12-19: qty 150

## 2020-12-19 NOTE — H&P (Signed)
Patrick Gordon, Dr. [id number] [birth year]     HPI:  Recent onset of dysphagia, associated with discontinuation of PPI. For EGD, possible dilatation.   Medications Prior to Admission  Medication Sig Dispense Refill Last Dose   aspirin EC 81 MG tablet Take 81 mg by mouth daily.   12/18/2020   atorvastatin (LIPITOR) 10 MG tablet Take 1 tablet (10 mg total) by mouth daily. 90 tablet 3 12/19/2020 at 0630   carvedilol (COREG) 12.5 MG tablet Take 1 tablet (12.5 mg total) by mouth 2 (two) times daily. 180 tablet 3 12/19/2020 at 0630   enalapril (VASOTEC) 2.5 MG tablet Take 1 tablet (2.5 mg total) by mouth 2 (two) times daily. 180 tablet 3 12/19/2020 at 0630   omeprazole (PRILOSEC) 40 MG capsule Take 40 mg by mouth daily.   12/18/2020   Fexofenadine HCl (ALLEGRA PO) Take 1 tablet by mouth daily.    at prn   ticagrelor (BRILINTA) 60 MG TABS tablet Take 1 tablet (60 mg total) by mouth 2 (two) times daily. 180 tablet 3 12/15/2020   Allergies  Allergen Reactions   Morphine And Related Nausea And Vomiting   Sulfa Antibiotics Rash    "FIXED DRUG ERUPTION"   Past Medical History:  Diagnosis Date   Acute MI anterior wall subsequent episode care [facility])    2014   Anxiety    Borderline diabetes    CAD (coronary artery disease)    Dyslipidemia    Dysrhythmia 2014   V-FIB   GERD (gastroesophageal reflux disease)    Thyroid nodule    Past Surgical History:  Procedure Laterality Date   CARDIAC CATHETERIZATION     CORONARY ANGIOPLASTY WITH STENT PLACEMENT  2014   2 STENTS IN LAD   KNEE ARTHROSCOPY Right 12/29/2014   Procedure: ARTHROSCOPY KNEE partial medial menisectomy;  Surgeon: [name], MD;  Location: [facility] ORS;  Service: Orthopedics;  Laterality: Right;   KNEE ARTHROSCOPY Left 05/09/2016   Procedure: ARTHROSCOPY KNEE, PARTIAL MEDIAL MENISECTOMY;  Surgeon: [name], MD;  Location: [facility] ORS;  Service: Orthopedics;  Laterality: Left;   LEFT HEART CATH AND CORONARY ANGIOGRAPHY N/A  07/30/2018   Procedure: LEFT HEART CATH AND CORONARY ANGIOGRAPHY;  Surgeon: Kathleene Hazel, MD;  Location: [facility] INVAS[facility] CV LAB;  Service: Cardiovascular;  Laterality: N/A;   LEFT HEART CATHETERIZATION WITH CORONARY ANGIOGRAM N/A 04/22/2012   Procedure: LEFT HEART CATHETERIZATION WITH CORONARY ANGIOGRAM;  Surgeon: [name], MD;  Location: [facility] CATH LAB;  Service: Cardiovascular;  Laterality: N/A;   LEFT HEART CATHETERIZATION WITH CORONARY ANGIOGRAM N/A 04/22/2013   Procedure: LEFT HEART CATHETERIZATION WITH CORONARY ANGIOGRAM;  Surgeon: [name], MD;  [facility];  Service: Cardiovascular;  Laterality: N/A;   MANDIBLE OSTEOTOMY     Social History   Socioeconomic History   Marital status: Married    Spouse name: Not on file   Number of children: Not on file   Years of education: Not on file   Highest education level: Not on file  Occupational History   Occupation: MD    Comment: [employer]  Tobacco Use   Smoking status: Former    Types: Cigars    Quit date: 12/26/2012    Years since quitting: 7.9   Smokeless tobacco: Never  Vaping Use   Vaping Use: Never used  Substance and Sexual Activity   Alcohol use: Yes    Alcohol/week: 7.0 standard drinks    Types: 7 Glasses of wine per  week    Comment: one glass of wine per day   Drug use: No   Sexual activity: Not on file  Other Topics Concern   Not on file  Social History Narrative   Not on file   Social Determinants of Health   Financial Resource Strain: Not on file  Food Insecurity: Not on file  Transportation Needs: Not on file  Physical Activity: Not on file  Stress: Not on file  Social Connections: Not on file  Intimate Partner Violence: Not on file   Social History   Social History Narrative   Not on file     ROS: Negative.     PE: HEENT: Negative. Lungs: Clear. Cardio: RR.  Assessment/Plan:  Proceed with planned upper endoscopy.    [name] 12/19/2020

## 2020-12-19 NOTE — Anesthesia Postprocedure Evaluation (Signed)
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) Performed: ESOPHAGOGASTRODUODENOSCOPY (EGD) WITH PROPOFOL  Patient location during evaluation: Endoscopy Anesthesia Type: General Level of consciousness: awake and alert Pain management: pain level controlled Vital Signs Assessment: post-procedure vital signs reviewed and stable Respiratory status: spontaneous breathing, nonlabored ventilation, respiratory function stable and patient connected to nasal cannula oxygen Cardiovascular status: blood pressure returned to baseline and stable Postop Assessment: no apparent nausea or vomiting Anesthetic complications: no   No notable events documented.   Last Vitals:  Vitals:   12/19/20 0851 12/19/20 0913  BP: 103/70 123/72  Pulse:    Resp:    Temp:    SpO2:      Last Pain:  Vitals:   12/19/20 0912  TempSrc:   PainSc: 0-No pain                 Lenard Simmer

## 2020-12-19 NOTE — Anesthesia Preprocedure Evaluation (Signed)
Anesthesia Evaluation  Patient identified by MRN, date of birth, ID band Patient awake    Reviewed: Allergy & Precautions, H&P , NPO status , Patient's Chart, lab work & pertinent test results  History of Anesthesia Complications Negative for: history of anesthetic complications  Airway Mallampati: II  TM Distance: <3 FB Neck ROM: limited    Dental  (+) Poor Dentition, Chipped, Implants, Caps, Dental Advidsory Given   Pulmonary neg shortness of breath, former smoker,    Pulmonary exam normal breath sounds clear to auscultation       Cardiovascular Exercise Tolerance: Good (-) angina+ CAD, + Past MI and + Cardiac Stents  (-) CABG and (-) DOE Normal cardiovascular exam+ dysrhythmias (-) Valvular Problems/Murmurs Rhythm:regular Rate:Normal     Neuro/Psych PSYCHIATRIC DISORDERS Anxiety negative neurological ROS     GI/Hepatic Neg liver ROS, GERD  Controlled,  Endo/Other  diabetes, Type 2  Renal/GU      Musculoskeletal   Abdominal   Peds  Hematology negative hematology ROS (+)   Anesthesia Other Findings Signs and symptoms suggestive of sleep apnea   Past Medical History: No date: Acute MI anterior wall subsequent episode care*     Comment: 2014 No date: Anxiety No date: Borderline diabetes No date: CAD (coronary artery disease) No date: Diabetes mellitus without complication (HCC)     Comment: boraderline No date: Dyslipidemia 2014: Dysrhythmia     Comment: V-FIB No date: GERD (gastroesophageal reflux disease) No date: Thyroid nodule  Past Surgical History: No date: CARDIAC CATHETERIZATION 2014: CORONARY ANGIOPLASTY WITH STENT PLACEMENT     Comment: 2 STENTS IN LAD 12/29/2014: KNEE ARTHROSCOPY Right     Comment: Procedure: ARTHROSCOPY KNEE partial medial               menisectomy;  Surgeon: [name], MD;                Location: [facility] ORS;  Service: Orthopedics;                Laterality:  Right; 04/22/2012: LEFT HEART CATHETERIZATION WITH CORONARY ANGIO* N/A     Comment: Procedure: LEFT HEART CATHETERIZATION WITH               CORONARY ANGIOGRAM;  Surgeon: [name],               MD;  Location: [facility] CATH LAB;  Service:               Cardiovascular;  Laterality: N/A; 04/22/2013: LEFT HEART CATHETERIZATION WITH CORONARY ANGIO* N/A     Comment: Procedure: LEFT HEART CATHETERIZATION WITH               CORONARY ANGIOGRAM;  Surgeon: [name],               MD;  Location: [facility] CATH LAB;  Service:               Cardiovascular;  Laterality: N/A; No date: MANDIBLE OSTEOTOMY     Reproductive/Obstetrics negative OB ROS                             Anesthesia Physical  Anesthesia Plan  ASA: 3  Anesthesia Plan: General   Post-op Pain Management:    Induction: Intravenous  PONV Risk Score and Plan: 2 and TIVA and Propofol infusion  Airway Management Planned: Natural Airway and Nasal Cannula  Additional Equipment:   Intra-op Plan:  Post-operative Plan:   Informed Consent: I have reviewed the patients History and Physical, chart, labs and discussed the procedure including the risks, benefits and alternatives for the proposed anesthesia with the patient or authorized representative who has indicated his/her understanding and acceptance.     Dental Advisory Given  Plan Discussed with: Anesthesiologist, CRNA and Surgeon  Anesthesia Plan Comments:         Anesthesia Quick Evaluation

## 2020-12-19 NOTE — Transfer of Care (Signed)
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  Last Pain:  Vitals:   12/19/20 0810  TempSrc: Temporal  PainSc: 0-No pain         Complications: No notable events documented.

## 2020-12-19 NOTE — Op Note (Signed)
[facility] Gastroenterology Patient Name: Patrick Gordon Procedure Date: 12/19/2020 8:10 AM MRN: [id number] Account #: [id number] Date of Birth: 05/08/1950 Admit Type: Outpatient Age: 70 Room: [facility] ENDO ROOM 1 Gender: Male Note Status: Finalized Instrument Name: Upper Endoscope [id number] Procedure:             Upper GI endoscopy Indications:           Dysphagia Providers:             Earline Mayotte, MD Referring MD:          [name], MD (Referring MD) Medicines:             Propofol per Anesthesia Complications:         No immediate complications. Procedure:             Pre-Anesthesia Assessment:                        - Prior to the procedure, a History and Physical was                         performed, and patient medications, allergies and                         sensitivities were reviewed. The patient's tolerance                         of previous anesthesia was reviewed.                        - The risks and benefits of the procedure and the                         sedation options and risks were discussed with the                         patient. All questions were answered and informed                         consent was obtained.                        After obtaining informed consent, the endoscope was                         passed under direct vision. Throughout the procedure,                         the patient's blood pressure, pulse, and oxygen                         saturations were monitored continuously. The Endoscope                         was introduced through the mouth, and advanced to the                         second part of duodenum. The upper GI endoscopy was  accomplished without difficulty. The patient tolerated                         the procedure well. Findings:      The esophagus was normal.      The examined duodenum was normal.      Patchy minimal inflammation characterized by erythema was  found in the       prepyloric region of the stomach. Impression:            - Normal esophagus.                        - Normal examined duodenum.                        - Chronic gastritis.                        - No specimens collected. Recommendation:        - Discharge patient to home (via wheelchair). Procedure Code(s):     --- Professional ---                        [phone], Esophagogastroduodenoscopy, flexible,                         transoral; diagnostic, including collection of                         specimen(s) by brushing or washing, when performed                         (separate procedure) Diagnosis Code(s):     --- Professional ---                        K29.50, Unspecified chronic gastritis without bleeding                        R13.10, Dysphagia, unspecified CPT copyright 2019 American Medical Association. All rights reserved. The codes documented in this report are preliminary and upon coder review may  be revised to meet current compliance requirements. Earline Mayotte, MD 12/19/2020 8:45:07 AM This report has been signed electronically. Number of Addenda: 0 Note Initiated On: 12/19/2020 8:10 AM Estimated Blood Loss:  Estimated blood loss: none.      [facility]

## 2020-12-20 ENCOUNTER — Encounter: Payer: Self-pay | Admitting: General Surgery

## 2021-01-07 ENCOUNTER — Encounter: Payer: Self-pay | Admitting: *Deleted

## 2021-02-18 NOTE — Progress Notes (Deleted)
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 He is s/p anterior STEMI associated with Vfib arrest on 04/22/2012. He underwent emergent stenting of the proximal to mid LAD with 2 long drug-eluting stents. Ejection fraction at cath was 25-30%. Echocardiogram demonstrated an ejection fraction 35%.Subsequent evaluation in [place] showed an EF 51% with small apical defect. He was concerned enough about his cardiac status that he underwent repeat cardiac cath on Feb. 6, 2015. This showed nonobstructive CAD with patent stents. EF was 50-55% with apical wall motion abnormality.   In May 2020 he complained of symptoms of dyspnea on exertion and some chest pressure. This led to a cardiac cath showing nonobstructive CAD and good patency of LAD stents. Good LV function with EF 50-55%. EDP was normal.  He wore a Zio patch monitor in Sept 2021 and this showed rare isolated PACs and PVCs with one PVC couplet. Carotid dopplers showed mild nonobstructive disease on the right ICA. Echo showed normal EF with mild AS mean gradient 11 mm Hg.   He did have upper and lower endoscopy in October. This showed some gastritis.   Rarely feels a little tightness when he first starts exercising but this goes away after the first mile. No bleeding. Continues to exercise regularly walking 6 miles. No chest pain. Occasionally still has some palpitations when under stress but has done Ecgs without acute change.    Current Outpatient Medications on File Prior to Visit  Medication Sig Dispense Refill   aspirin EC 81 MG tablet Take 81 mg by mouth daily.     atorvastatin (LIPITOR) 10 MG tablet Take 1 tablet (10 mg total) by mouth daily. 90 tablet 3   carvedilol (COREG) 12.5 MG tablet Take 1 tablet (12.5 mg total) by mouth 2 (two) times daily. 180 tablet 3   enalapril (VASOTEC) 2.5 MG tablet Take 1 tablet (2.5 mg total) by  mouth 2 (two) times daily. 180 tablet 3   Fexofenadine HCl (ALLEGRA PO) Take 1 tablet by mouth daily.     omeprazole (PRILOSEC) 40 MG capsule Take 40 mg by mouth daily.     ticagrelor (BRILINTA) 60 MG TABS tablet Take 1 tablet (60 mg total) by mouth 2 (two) times daily. 180 tablet 3   No current facility-administered medications on file prior to visit.    Allergies  Allergen Reactions   Morphine And Related Nausea And Vomiting   Sulfa Antibiotics Rash    "FIXED DRUG ERUPTION"    Past Medical History:  Diagnosis Date   Acute MI anterior wall subsequent episode care [facility])    2014   Anxiety    Borderline diabetes    CAD (coronary artery disease)    Dyslipidemia    Dysrhythmia 2014   V-FIB   GERD (gastroesophageal reflux disease)    Thyroid nodule     Past Surgical History:  Procedure Laterality Date   CARDIAC CATHETERIZATION     CORONARY ANGIOPLASTY WITH STENT PLACEMENT  2014   2 STENTS IN LAD   ESOPHAGOGASTRODUODENOSCOPY (EGD) WITH PROPOFOL N/A 12/19/2020   Procedure: ESOPHAGOGASTRODUODENOSCOPY (EGD) WITH PROPOFOL;  Surgeon: Earline Mayotte, MD;  Location: [facility] ENDOSCOPY;  Service: Endoscopy;  Laterality: N/A;  1ST CASE   KNEE ARTHROSCOPY Right 12/29/2014   Procedure: ARTHROSCOPY KNEE partial medial menisectomy;  Surgeon: [name], MD;  Location: [facility] ORS;  Service: Orthopedics;  Laterality: Right;   KNEE ARTHROSCOPY  Left 05/09/2016   Procedure: ARTHROSCOPY KNEE, PARTIAL MEDIAL MENISECTOMY;  Surgeon: [name], MD;  Location: [facility] ORS;  Service: Orthopedics;  Laterality: Left;   LEFT HEART CATH AND CORONARY ANGIOGRAPHY N/A 07/30/2018   Procedure: LEFT HEART CATH AND CORONARY ANGIOGRAPHY;  Surgeon: Kathleene Hazel, MD;  Location: [facility] INVASIVE CV LAB;  Service: Cardiovascular;  Laterality: N/A;   LEFT HEART CATHETERIZATION WITH CORONARY ANGIOGRAM N/A 04/22/2012   Procedure: LEFT HEART CATHETERIZATION WITH CORONARY ANGIOGRAM;  Surgeon: Alainah Phang M Swaziland, MD;   Location: [facility] CATH LAB;  Service: Cardiovascular;  Laterality: N/A;   LEFT HEART CATHETERIZATION WITH CORONARY ANGIOGRAM N/A 04/22/2013   Procedure: LEFT HEART CATHETERIZATION WITH CORONARY ANGIOGRAM;  Surgeon: Iyah Laguna M Swaziland, MD;  Location: [facility] CATH LAB;  Service: Cardiovascular;  Laterality: N/A;   MANDIBLE OSTEOTOMY      Social History   Tobacco Use  Smoking Status Former   Types: Cigars   Quit date: 12/26/2012   Years since quitting: 8.1  Smokeless Tobacco Never    Social History   Substance and Sexual Activity  Alcohol Use Yes   Alcohol/week: 7.0 standard drinks   Types: 7 Glasses of wine per week   Comment: one glass of wine per day    Family History  Problem Relation Age of Onset   Hypertension Mother    Stroke Mother    COPD Father    Diabetes Father    Hypertension Father    Congestive Heart Failure Father     Review of Systems: As noted in history of present illness.   All other systems were reviewed and are negative.  Physical Exam: There were no vitals taken for this visit. GENERAL:  Well appearing male in NAD HEENT:  PERRL, EOMI, sclera are clear. Oropharynx is clear. NECK:  No jugular venous distention, carotid upstroke brisk and symmetric, no bruits, no thyromegaly or adenopathy LUNGS:  Clear to auscultation bilaterally CHEST:  Unremarkable HEART:  RRR,  PMI not displaced or sustained,S1 and S2 within normal limits, no S3, no S4: no clicks, no rubs, gr 2/6 systolic murmur at the apex/LSB ABD:  Soft, nontender. BS +, no masses or bruits. No hepatomegaly, no splenomegaly EXT:  2 + pulses throughout, no edema, no cyanosis no clubbing SKIN:  Warm and dry.  No rashes NEURO:  Alert and oriented x 3. Cranial nerves II through XII intact. PSYCH:  Cognitively intact    LABORATORY DATA: Labs reviewed from 02/11/16: Normal CMET. gluose 139. cholesterol 105, trig 150, HDL 43, LDL 32. CBC normal. A1c 5.5%. Normal thyroid studies. Dated 05/26/17: Normal CMET.  Cholesterol 108, triglycerides 77, HDL 49, LDL 44. TFTs normal. CBC normal. A1c 5%. CRP 0.59. Dated 04/05/18: glucose 121, otherwise chemistries normal. Cholesterol 120, triglycerides 180, HDL 36, LDL 48. TSH and CBC normal. A1c 5.7%.  Dated 07/09/18: glucose 136, otherwise CMET normal. Uric acid normal. Cholesterol 99, triglycerides 76, HDL 40, LDL 44. CBC and TSH normal. A1c 5.4%.  Dated 07/20/19: glucose 105. Otherwise CMET normal. Cholesterol 71, triglycerides 72, HDL 31, LDL 24. TFTS and CBC normal. A1c 5.4%.   Ecg today shows NSR rate 61. Old septal infarct. No change. I have personally reviewed and interpreted this study.  Echo done 05/26/17: normal LV function. Mild LVH. LAE. No valvular disease.   Cardiac cath 07/30/18:  LEFT HEART CATH AND CORONARY ANGIOGRAPHY  Conclusion      Prox RCA lesion is 40% stenosed. Mid RCA lesion is 20% stenosed. 2nd Mrg lesion is 20%  stenosed. Prox LAD to Mid LAD lesion is 10% stenosed. The left ventricular systolic function is normal. LV end diastolic pressure is normal. The left ventricular ejection fraction is greater than 65% by visual estimate. There is no mitral valve regurgitation.   1. Patent stent proximal and mid LAD with minimal restenosis.  2. Mild non-obstructive disease in the Circumflex system 3. Moderate non-obstructive disease in the mid RCA. This does not appear to be flow limiting.  4. Preserved LV systolic function with apical hypokinesis   Recommendations: Medical management of CAD       Assessment / Plan: 1. CAD with remote Anterior STEMI complicated by ventricular fibrillation arrest in 2014. Status post DES x2 to the proximal and mid LAD in February 2014. Repeat cardiac cath in May 2020 showed nonobstructive disease and EF improved.     Continue DAPT with reduced dose Brilinta indefinitely as long as no bleeding problems.   2. Ischemic cardiomyopathy. Initial Ejection fraction 25-35% with MI. Now improved to normal based on  last Echo.Cardiac cath in May 2020 showed EF 50-55%. Continue current doses of ACE inhibitor and carvedilol.  3. Dyslipidemia. Continue statin therapy. Dietary modification. He had recent labs and report total cholesterol was 80-90  4. Prediabetes. Encourage  aerobic activity and weight loss. Last A1c 5.4%.   5. Mild AS on exam. Recommend Echo every 3 years.   Follow up in 6 months.

## 2021-02-22 ENCOUNTER — Ambulatory Visit: Payer: Medicare Other | Admitting: Cardiology

## 2021-06-30 NOTE — Progress Notes (Signed)
? ?The [facility] TravelersShamil J Gordon, Dr. ?Date of Birth: 07-10-1950 ?Medical Record #[id number] ? 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 He is s/p anterior STEMI associated with Vfib arrest on 04/22/2012. He underwent emergent stenting of the proximal to mid LAD with 2 long drug-eluting stents. Ejection fraction at cath was 25-30%. Echocardiogram demonstrated an ejection fraction 35%.[place] EF 51% with small apical defect. He was concerned enough about his cardiac status that he underwent repeat cardiac cath on Feb. 6, 2015. This showed nonobstructive CAD with patent stents. EF was 50-55% with apical wall motion abnormality.  ? ?In May 2020 he complained of symptoms of dyspnea on exertion and some chest pressure. This led to a cardiac cath showing nonobstructive CAD and good patency of LAD stents. Good LV function with EF 50-55%. EDP was normal. ? ?He wore a Zio patch monitor in Sept 2021 and this showed rare isolated PACs and PVCs with one PVC couplet. Carotid dopplers showed mild nonobstructive disease on the right ICA. Echo showed normal EF with mild AS mean gradient 11 mm Hg.  ? ?He did have upper endoscopy October showing some gastritis. He had ENT evaluation in Dec for hoarseness. Laryngoscopy was OK. He did get mixed up with his meds and was taking amlodipine instead of enalapril.  ? ?He still feels a little tightness when he first starts exercising but this goes away after the first mile. No bleeding. Continues to exercise regularly walking 6 miles. No chest pain. No palpitations.  ?  ? ?Current Outpatient Medications on File Prior to Visit  ?Medication Sig Dispense Refill  ? aspirin EC 81 MG tablet Take 81 mg by mouth daily.    ? atorvastatin (LIPITOR) 10 MG tablet Take 1 tablet (10 mg total) by mouth daily. 90 tablet 3  ? carvedilol (COREG) 12.5 MG tablet Take 1 tablet (12.5 mg total) by mouth 2 (two) times daily. 180 tablet 3  ? enalapril  (VASOTEC) 2.5 MG tablet Take 1 tablet (2.5 mg total) by mouth 2 (two) times daily. 180 tablet 3  ? omeprazole (PRILOSEC) 40 MG capsule Take 40 mg by mouth daily.    ? ticagrelor (BRILINTA) 60 MG TABS tablet Take 1 tablet (60 mg total) by mouth 2 (two) times daily. 180 tablet 3  ? Fexofenadine HCl (ALLEGRA PO) Take 1 tablet by mouth daily. (Patient not taking: Reported on 07/05/2021)    ? fluticasone (FLONASE) 50 MCG/ACT nasal spray 2 (two) times daily.    ? ?No current facility-administered medications on file prior to visit.  ? ? ?Allergies  ?Allergen Reactions  ? Morphine And Related Nausea And Vomiting  ? Sulfa Antibiotics Rash  ?  "FIXED DRUG ERUPTION"  ? ? ?Past Medical History:  ?Diagnosis Date  ? Acute MI anterior wall subsequent episode care [facility](HCC)   ? 2014  ? Anxiety   ? Borderline diabetes   ? CAD (coronary artery disease)   ? Dyslipidemia   ? Dysrhythmia 2014  ? V-FIB  ? GERD (gastroesophageal reflux disease)   ? Thyroid nodule   ? ? ?Past Surgical History:  ?Procedure Laterality Date  ? CARDIAC CATHETERIZATION    ? CORONARY ANGIOPLASTY WITH STENT PLACEMENT  2014  ? 2 STENTS IN LAD  ? ESOPHAGOGASTRODUODENOSCOPY (EGD) WITH PROPOFOL N/A 12/19/2020  ? Procedure: ESOPHAGOGASTRODUODENOSCOPY (EGD) WITH PROPOFOL;  Surgeon: Earline MayotteByrnett, Jeffrey W, MD;  Location: [facility] ENDOSCOPY;  Service: Endoscopy;  Laterality: N/A;  1ST CASE  ? KNEE  ARTHROSCOPY Right 12/29/2014  ? Procedure: ARTHROSCOPY KNEE partial medial menisectomy;  Surgeon: [name], MD;  Location: [facility] ORS;  Service: Orthopedics;  Laterality: Right;  ? KNEE ARTHROSCOPY Left 05/09/2016  ? Procedure: ARTHROSCOPY KNEE, PARTIAL MEDIAL MENISECTOMY;  Surgeon: [name], MD;  Location: [facility] ORS;  Service: Orthopedics;  Laterality: Left;  ? LEFT HEART CATH AND CORONARY ANGIOGRAPHY N/A 07/30/2018  ? Procedure: LEFT HEART CATH AND CORONARY ANGIOGRAPHY;  Surgeon: Kathleene Hazel, MD;  Location: [facility] INVASIVE CV LAB;  Service: Cardiovascular;  Laterality: N/A;   ? LEFT HEART CATHETERIZATION WITH CORONARY ANGIOGRAM N/A 04/22/2012  ? Procedure: LEFT HEART CATHETERIZATION WITH CORONARY ANGIOGRAM;  Surgeon: Brayton Baumgartner M [place], MD;  Location: [facility] CATH LAB;  Service: Cardiovascular;  Laterality: N/A;  ? LEFT HEART CATHETERIZATION WITH CORONARY ANGIOGRAM N/A 04/22/2013  ? Procedure: LEFT HEART CATHETERIZATION WITH CORONARY ANGIOGRAM;  Surgeon: Benjamen Koelling M [place]az[place], MD;  Location: [facility] CATH LAB;  Service: Cardiovascular;  Laterality: N/A;  ? MANDIBLE OSTEOTOMY    ? ? ?Social History  ? ?Tobacco Use  ?Smoking Status Former  ? Types: Cigars  ? Quit date: 12/26/2012  ? Years since quitting: 8.5  ?Smokeless Tobacco Never  ? ? ?Social History  ? ?Substance and Sexual Activity  ?Alcohol Use Yes  ? Alcohol/week: 7.0 standard drinks  ? Types: 7 Glasses of wine per week  ? Comment: one glass of wine per day  ? ? ?Family History  ?Problem Relation Age of Onset  ? Hypertension Mother   ? Stroke Mother   ? COPD Father   ? Diabetes Father   ? Hypertension Father   ? Congestive Heart Failure Father   ? ? ?Review of Systems: ?As noted in history of present illness.   All other systems were reviewed and are negative. ? ?Physical Exam: ?BP 114/60 (BP Location: Left Arm, Patient Position: Sitting, Cuff Size: Normal)   Pulse 70   Ht 5\' 8"  (1.727 m)   Wt 190 lb (86.2 kg)   SpO2 97%   BMI 28.89 kg/m?  ?GENERAL:  Well appearing male in NAD ?HEENT:  PERRL, EOMI, sclera are clear. Oropharynx is clear. ?NECK:  No jugular venous distention, carotid upstroke brisk and symmetric, bilateral  bruits, no thyromegaly or adenopathy ?LUNGS:  Clear to auscultation bilaterally ?CHEST:  Unremarkable ?HEART:  RRR,  PMI not displaced or sustained,S1 and S2 within normal limits, no S3, no S4: no clicks, no rubs, gr 2/6 systolic murmur at the apex/LSB ?ABD:  Soft, nontender. BS +, no masses or bruits. No hepatomegaly, no splenomegaly ?EXT:  2 + pulses throughout, no edema, no cyanosis no clubbing ?SKIN:  Warm and dry.  No  rashes ?NEURO:  Alert and oriented x 3. Cranial nerves II through XII intact. ?PSYCH:  Cognitively intact ? ? ? ?LABORATORY DATA: ?Labs reviewed from 02/11/16: Normal CMET. gluose 139. cholesterol 105, trig 150, HDL 43, LDL 32. CBC normal. A1c 5.5%. Normal thyroid studies. ?Dated 05/26/17: Normal CMET. Cholesterol 108, triglycerides 77, HDL 49, LDL 44. TFTs normal. CBC normal. A1c 5%. CRP 0.59. ?Dated 04/05/18: glucose 121, otherwise chemistries normal. Cholesterol 120, triglycerides 180, HDL 36, LDL 48. TSH and CBC normal. A1c 5.7%.  ?Dated 07/09/18: glucose 136, otherwise CMET normal. Uric acid normal. Cholesterol 99, triglycerides 76, HDL 40, LDL 44. CBC and TSH normal. A1c 5.4%.  ?Dated 07/20/19: glucose 105. Otherwise CMET normal. Cholesterol 71, triglycerides 72, HDL 31, LDL 24. TFTS and CBC normal. A1c 5.4%.  ?Dated 05/22/21: cholesterol 103, triglycerides  65, HDL 42, LDL 47. Glucose 113, A1c 5.4%. CMET and CBC normal. TFTs normal.  ? ? ?Echo done 05/26/17: normal LV function. Mild LVH. LAE. No valvular disease.  ? ?Cardiac cath 07/30/18:  ?LEFT HEART CATH AND CORONARY ANGIOGRAPHY  ?Conclusion  ?  ?  ?Prox RCA lesion is 40% stenosed. ?Mid RCA lesion is 20% stenosed. ?2nd Mrg lesion is 20% stenosed. ?Prox LAD to Mid LAD lesion is 10% stenosed. ?The left ventricular systolic function is normal. ?LV end diastolic pressure is normal. ?The left ventricular ejection fraction is greater than 65% by visual estimate. ?There is no mitral valve regurgitation. ?  ?1. Patent stent proximal and mid LAD with minimal restenosis.  ?2. Mild non-obstructive disease in the Circumflex system ?3. Moderate non-obstructive disease in the mid RCA. This does not appear to be flow limiting.  ?4. Preserved LV systolic function with apical hypokinesis ?  ?Recommendations: Medical management of CAD ?   ?  ? ?Assessment / Plan: ?1. CAD with remote Anterior STEMI complicated by ventricular fibrillation arrest in 2014. Status post DES x2 to the  proximal and mid LAD in February 2014. Repeat cardiac cath in May 2020 showed nonobstructive disease and EF improved.     Continue DAPT with reduced dose Brilinta indefinitely as long as no bleeding problems. He has stable cl

## 2021-07-05 ENCOUNTER — Ambulatory Visit (INDEPENDENT_AMBULATORY_CARE_PROVIDER_SITE_OTHER): Payer: Medicare Other | Admitting: Cardiology

## 2021-07-05 ENCOUNTER — Encounter: Payer: Self-pay | Admitting: Cardiology

## 2021-07-05 VITALS — BP 114/60 | HR 70 | Ht 68.0 in | Wt 190.0 lb

## 2021-07-05 DIAGNOSIS — I252 Old myocardial infarction: Secondary | ICD-10-CM | POA: Diagnosis not present

## 2021-07-05 DIAGNOSIS — I350 Nonrheumatic aortic (valve) stenosis: Secondary | ICD-10-CM | POA: Diagnosis not present

## 2021-07-05 DIAGNOSIS — I2510 Atherosclerotic heart disease of native coronary artery without angina pectoris: Secondary | ICD-10-CM | POA: Diagnosis not present

## 2021-07-05 DIAGNOSIS — E785 Hyperlipidemia, unspecified: Secondary | ICD-10-CM

## 2021-08-15 ENCOUNTER — Other Ambulatory Visit: Payer: Self-pay

## 2021-08-15 ENCOUNTER — Emergency Department: Payer: Medicare Other

## 2021-08-15 ENCOUNTER — Emergency Department
Admission: EM | Admit: 2021-08-15 | Discharge: 2021-08-15 | Disposition: A | Discharge status: Home / Self Care | Payer: Medicare Other | Attending: Emergency Medicine | Admitting: Emergency Medicine

## 2021-08-15 DIAGNOSIS — M545 Low back pain, unspecified: Secondary | ICD-10-CM | POA: Diagnosis present

## 2021-08-15 DIAGNOSIS — M5441 Lumbago with sciatica, right side: Secondary | ICD-10-CM | POA: Diagnosis not present

## 2021-08-15 MED ORDER — DIAZEPAM 2 MG PO TABS
2.0000 mg | ORAL_TABLET | Freq: Once | ORAL | Status: AC
  Administered 2021-08-15: 2 mg via ORAL
  Filled 2021-08-15: qty 1

## 2021-08-15 MED ORDER — ONDANSETRON 4 MG PO TBDP
4.0000 mg | ORAL_TABLET | Freq: Once | ORAL | Status: DC
  Filled 2021-08-15: qty 1

## 2021-08-15 MED ORDER — OXYCODONE-ACETAMINOPHEN 7.5-325 MG PO TABS: ORAL_TABLET | ORAL | 0 refills | Status: DC

## 2021-08-15 MED ORDER — OXYCODONE-ACETAMINOPHEN 7.5-325 MG PO TABS
1.0000 | ORAL_TABLET | Freq: Once | ORAL | Status: AC
  Administered 2021-08-15: 1 via ORAL
  Filled 2021-08-15: qty 1

## 2021-08-15 NOTE — ED Notes (Signed)
Kerrie Buffalo PA at bedside

## 2021-08-15 NOTE — ED Provider Notes (Signed)
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  Patient has had a history of a ruptured disc in the past.  Patient has taken prednisone tapering dose starting with 60 mg, Toradol IM, and had some leftover hydrocodone which she took last evening without any relief of his pain.  He denies any urinary symptoms.  Pain is increased with movement and he is unable to stand or bear weight without pain.  No incontinence of bowel or bladder.      Physical Exam   Triage Vital Signs: ED Triage Vitals  Enc Vitals Group     BP 08/15/21 0722 (!) 151/91     Pulse Rate 08/15/21 0722 85     Resp --      Temp 08/15/21 0722 (!) 97.4 F (36.3 C)     Temp Source 08/15/21 0722 Oral     SpO2 08/15/21 0722 100 %     Weight 08/15/21 0716 180 lb (81.6 kg)     Height 08/15/21 0716 5\' 8"  (1.727 m)     Head Circumference --      Peak Flow --      Pain Score 08/15/21 0716 9     Pain Loc --      Pain Edu? --      Excl. in GC? --     Most recent vital signs: Vitals:   08/15/21 0722  BP: (!) 151/91  Pulse: 85  Temp: (!) 97.4 F (36.3 C)  SpO2: 100%     General: Awake, no distress.  CV:  Good peripheral perfusion.  Resp:  Normal effort.  Abd:  No distention.  Other:  On examination of the back there is no point tenderness of the thoracic spine however in the lumbar and lower lumbar and right SI joint area there is moderate tenderness.  No step-offs are noted on palpation of the lumbar spine.  Straight leg raises on the left were negative however on the right at approximately 45 degrees there was discomfort in the right lower back area.  Good muscle strength bilaterally at 5/5.  Reflexes to the left lower  extremity is hyperreactive in comparison with the right.   ED Results / Procedures / Treatments   Labs (all labs ordered are listed, but only abnormal results are displayed) Labs Reviewed - No data to display    RADIOLOGY  MRI lumbar spine without contrast radiology report was reviewed with multilevel changes including stenosis, nerve root impingement, and asymmetric disc bulge on the right.   PROCEDURES:  Critical Care performed:   Procedures   MEDICATIONS ORDERED IN ED: Medications  ondansetron (ZOFRAN-ODT) disintegrating tablet 4 mg (0 mg Oral Hold 08/15/21 0822)  oxyCODONE-acetaminophen (PERCOCET) 7.5-325 MG per tablet 1 tablet (1 tablet Oral Given 08/15/21 0820)  diazepam (VALIUM) tablet 2 mg (2 mg Oral Given 08/15/21 0820)     IMPRESSION / MDM / ASSESSMENT AND PLAN / ED COURSE  I reviewed the triage vital signs and the nursing notes.   Differential diagnosis includes, but is not limited to, low back pain with right leg radiculopathy, cauda equina, herniated disc, nerve root impingement.  71 year old male  presents to the ED with complaint of right low back pain with radiation into his right lower extremity to the point that he is unable to bear weight without increasing his pain.  No recent injury.  Patient has tried a tapering dose of steroids starting at 60 mg, Toradol and hydrocodone all without any relief.  He states he has an appoint with Dr. Ernest Pine at 4 PM today.  An MRI was ordered to rule out a cauda equina situation which was negative.  The multiple problems noted on his MRI was discussed with patient and a copy of his report was given.  While in the emergency department patient was given Percocet 7.5/325 and diazepam 2 mg p.o. which did not seem to help with his pain.  I offered to give him something stronger however he declined it so that any medication given here would not interfere with what Dr. Ernest Pine would give him this afternoon.  Before his appointments afternoon  patient is to return to the emergency department should he develop any sudden loss of bowel or bladder.     FINAL CLINICAL IMPRESSION(S) / ED DIAGNOSES   Final diagnoses:  Acute right-sided low back pain with right-sided sciatica     Rx / DC Orders   ED Discharge Orders          Ordered    oxyCODONE-acetaminophen (PERCOCET) 7.5-325 MG tablet        08/15/21 1030             Note:  This document was prepared using Dragon voice recognition software and may include unintentional dictation errors.   Tommi Rumps, PA-C 08/15/21 1546    Minna Antis, MD 08/16/21 2219

## 2021-08-15 NOTE — ED Triage Notes (Signed)
Pt c/o right lower back pain radiating into the leg with burning pain and numbness, states he has a hx of ruptured disc in the past

## 2021-08-15 NOTE — ED Notes (Signed)
Pt states he has been having burning and pain in his R leg all night and that the pain kept him awake all night- pt states he took multiple meds to try to help but nothing did- pt states he has a hx of sciatica problems and had this happen in his L leg in 2000- pt does not know what he did to cause the flare up

## 2021-10-30 LAB — HM DIABETES EYE EXAM

## 2021-11-19 ENCOUNTER — Other Ambulatory Visit: Payer: Self-pay | Admitting: *Deleted

## 2021-12-12 ENCOUNTER — Telehealth: Payer: Self-pay

## 2021-12-12 NOTE — Telephone Encounter (Signed)
He requested to see Dr.Yarbrough specifically so he was ok with waiting until 10/12 at 1pm. That date and time worked best for his schedule.

## 2021-12-12 NOTE — Telephone Encounter (Signed)
-----   Message from Peggyann Shoals sent at 12/12/2021  9:15 AM EDT ----- Regarding: work in Dr. Christen Butter: [phone] Dr.Hooten called if Dr.Fruge can be worked in to see [place] or Marzetta Board. His last MRI was in June and he has done home exercises. Dr. Marry Guan has prescribed several steroid dose packs in the past.  Discussed injections but has not had any yet.

## 2021-12-12 NOTE — Telephone Encounter (Signed)
Dr Izora Ribas reviewed. Can add to 9am with [facility] tomorrow. If he can't do that, can schedule next available with [facility] or Dr Izora Ribas.

## 2021-12-12 NOTE — Telephone Encounter (Signed)
Left message to call back  

## 2021-12-17 ENCOUNTER — Other Ambulatory Visit: Payer: Self-pay | Admitting: Emergency Medicine

## 2021-12-25 NOTE — Progress Notes (Signed)
Referring Physician:  [name], MD [address] [facility] [place],  [place] [address]  Primary Physician:  [name], MD  History of Present Illness: 12/25/2021 [name] is here today with a chief complaint of right leg, buttock, and lateral calf pain.  He began having pain around [place] weekend this year.  He was having significant right buttock pain with worse pain down his anterolateral calf particular when he walked or stood.  His pain resolved after a few weeks but has since come back on 2 different occasions, most recently this past weekend.  His pain can be as bad as 10 out of 10 requiring him to sit in a rolling chair to work.  He had an L2-3 disc herniation many years ago, but that improved with conservative management.  He has taken both prednisone and Flexeril during this exacerbation with good response.   Bowel/Bladder Dysfunction: none  Conservative measures:  Physical therapy:  has not participated Multimodal medical therapy including regular antiinflammatories:  oxycodone, gabapentin, prednisone, skelaxin, cyclobenzaprine Injections:  has not received epidural steroid injections  Past Surgery:  denies  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  The symptoms are causing a significant impact on the patient's life.   Review of Systems:  A 10 point review of systems is negative, except for the pertinent positives and negatives detailed in the HPI.  Past Medical History: Past Medical History:  Diagnosis Date   Acute MI anterior wall subsequent episode care [facility])    2014   Anxiety    Borderline diabetes    CAD (coronary artery disease)    Dyslipidemia    Dysrhythmia 2014   V-FIB   GERD (gastroesophageal reflux disease)    Thyroid nodule     Past Surgical History: Past Surgical History:  Procedure Laterality Date   CARDIAC CATHETERIZATION     CORONARY ANGIOPLASTY WITH STENT PLACEMENT  2014    2 STENTS IN LAD   ESOPHAGOGASTRODUODENOSCOPY (EGD) WITH PROPOFOL N/A 12/19/2020   Procedure: ESOPHAGOGASTRODUODENOSCOPY (EGD) WITH PROPOFOL;  Surgeon: Earline Mayotte, MD;  Location: [facility] ENDOSCOPY;  Service: Endoscopy;  Laterality: N/A;  1ST CASE   KNEE ARTHROSCOPY Right 12/29/2014   Procedure: ARTHROSCOPY KNEE partial medial menisectomy;  Surgeon: [name], MD;  Location: [facility] ORS;  Service: Orthopedics;  Laterality: Right;   KNEE ARTHROSCOPY Left 05/09/2016   Procedure: ARTHROSCOPY KNEE, PARTIAL MEDIAL MENISECTOMY;  Surgeon: [name], MD;  Location: [facility] ORS;  Service: Orthopedics;  Laterality: Left;   LEFT HEART CATH AND CORONARY ANGIOGRAPHY N/A 07/30/2018   Procedure: LEFT HEART CATH AND CORONARY ANGIOGRAPHY;  Surgeon: Kathleene Hazel, MD;  [facility] INVASIVE CV LAB;  Service: Cardiovascular;  Laterality: N/A;   LEFT HEART CATHETERIZATION WITH CORONARY ANGIOGRAM N/A 04/22/2012   Procedure: LEFT HEART CATHETERIZATION WITH CORONARY ANGIOGRAM;  Surgeon: [name], MD;  Location: [facility] CATH LAB;  Service: Cardiovascular;  Laterality: N/A;   LEFT HEART CATHETERIZATION WITH CORONARY ANGIOGRAM N/A 04/22/2013   Procedure: LEFT HEART CATHETERIZATION WITH CORONARY ANGIOGRAM;  Surgeon: [name], MD;  Location: [facility] CATH LAB;  Service: Cardiovascular;  Laterality: N/A;   MANDIBLE OSTEOTOMY      Allergies: Allergies as of 12/26/2021 - Review Complete 08/15/2021  Allergen Reaction Noted   Morphine and related Nausea And Vomiting 05/14/2012   Sulfa antibiotics Rash 04/22/2012    Medications: No outpatient medications have been marked as taking for the 12/26/21 encounter (Appointment)  with Meade Maw, MD.    Social History: Social History   Tobacco Use   Smoking status: Former    Types: Cigars    Quit date: 12/26/2012    Years since quitting: 9.0   Smokeless tobacco: Never  Vaping Use   Vaping Use: Never used  Substance Use Topics   Alcohol use: Yes     Alcohol/week: 7.0 standard drinks of alcohol    Types: 7 Glasses of wine per week    Comment: one glass of wine per day   Drug use: No    Family Medical History: Family History  Problem Relation Age of Onset   Hypertension Mother    Stroke Mother    COPD Father    Diabetes Father    Hypertension Father    Congestive Heart Failure Father     Physical Examination: There were no vitals filed for this visit.  General: Patient is well developed, well nourished, calm, collected, and in no apparent distress. Attention to examination is appropriate.  Neck:   Supple.  Full range of motion.  Respiratory: Patient is breathing without any difficulty.   NEUROLOGICAL:     Awake, alert, oriented to person, place, and time.  Speech is clear and fluent. Fund of knowledge is appropriate.   Cranial Nerves: Pupils equal round and reactive to light.  Facial tone is symmetric.  Facial sensation is symmetric. Shoulder shrug is symmetric. Tongue protrusion is midline.  There is no pronator drift.  ROM of spine: full.    Strength: Side Biceps Triceps Deltoid Interossei Grip Wrist Ext. Wrist Flex.  R 5 5 5 5 5 5 5   L 5 5 5 5 5 5 5    Side Iliopsoas Quads Hamstring PF DF EHL  R 5 5 5 5 5 5   L 5 5 5 5 5 5    Reflexes are 1+ and symmetric at the biceps, triceps, brachioradialis, but 2+ left patella and 1+ right patella.   Hoffman's is absent.   Bilateral upper and lower extremity sensation is intact to light touch.    No evidence of dysmetria noted.  Gait is normal.    Straight leg raise negative to 45 degrees.  Tinel's negative at the right fibular head   Medical Decision Making  Imaging: MRI L spine 08/15/21 IMPRESSION: Multilevel degenerative changes of the lumbar spine, worst at L2-L3, L3-L4, and L4-L5, as summarized below:   L1-L2: Mild right subarticular stenosis. No neural foraminal stenosis.   L2-L3: Mild retrolisthesis. Left greater than right subarticular stenosis and  moderate left neural foraminal stenosis. Contact with the descending left L3 nerve root.   L3-L4: Trace retrolisthesis with disc bulging and biforaminal disc protrusions. Moderate to severe left-sided subarticular stenosis with potential for impingement of the descending left L4 nerve root. Moderate bilateral neural foraminal stenosis, left worse than right.   L4-L5: Grade 1 anterolisthesis. Moderate spinal canal stenosis and subarticular stenosis, left worse than right, potentially impinging the descending left L5 nerve root. Moderate-severe left neural foraminal stenosis and contact with the exiting left L4 nerve root. Mild to moderate right neural foraminal stenosis.   L5-S1: Mild right neural foraminal stenosis primarily due to facet spurring.     Electronically Signed   By: Maurine Simmering M.D.   On: 08/15/2021 09:59  I have personally reviewed the images and agree with the above interpretation.  Assessment and Plan: Mr. Veron is a pleasant 71 y.o. male with possible right sided L5 radiculopathy with anterolisthesis of L4 and  L5.  He has a small disc herniation at L3-4 on the right.  He does not have clearly L4 mediated symptoms.  At this point, his symptoms are episodic.  I recommended starting physical therapy.  We will hold off on any injections unless he has worsening pain.  He will let me know if that occurs.  We will wait to schedule additional follow-up appointments depending on how his symptoms proceed.   I spent a total of 30 minutes in face-to-face and non-face-to-face activities related to this patient's care today.  Thank you for involving me in the care of this patient.      Miku Udall K. Myer Haff MD, [facility] Neurosurgery

## 2021-12-26 ENCOUNTER — Ambulatory Visit (INDEPENDENT_AMBULATORY_CARE_PROVIDER_SITE_OTHER): Payer: Medicare Other | Admitting: Neurosurgery

## 2021-12-26 ENCOUNTER — Encounter: Payer: Self-pay | Admitting: Neurosurgery

## 2021-12-26 VITALS — BP 131/79 | HR 86 | Ht 68.0 in | Wt 192.8 lb

## 2021-12-26 DIAGNOSIS — M5416 Radiculopathy, lumbar region: Secondary | ICD-10-CM

## 2021-12-26 DIAGNOSIS — M4316 Spondylolisthesis, lumbar region: Secondary | ICD-10-CM

## 2022-06-11 ENCOUNTER — Other Ambulatory Visit: Payer: Self-pay

## 2022-06-26 ENCOUNTER — Emergency Department: Payer: Medicare Other

## 2022-06-26 ENCOUNTER — Other Ambulatory Visit: Payer: Self-pay

## 2022-06-26 ENCOUNTER — Other Ambulatory Visit: Payer: Self-pay | Admitting: Internal Medicine

## 2022-06-26 ENCOUNTER — Emergency Department
Admission: EM | Admit: 2022-06-26 | Discharge: 2022-06-26 | Disposition: A | Discharge status: Home / Self Care | Payer: Medicare Other | Attending: Emergency Medicine | Admitting: Emergency Medicine

## 2022-06-26 ENCOUNTER — Emergency Department
Admission: RE | Admit: 2022-06-26 | Discharge: 2022-06-26 | Disposition: A | Discharge status: Home / Self Care | Payer: Medicare Other | Source: Ambulatory Visit | Attending: Internal Medicine | Admitting: Internal Medicine

## 2022-06-26 DIAGNOSIS — Z87891 Personal history of nicotine dependence: Secondary | ICD-10-CM | POA: Insufficient documentation

## 2022-06-26 DIAGNOSIS — S066X0A Traumatic subarachnoid hemorrhage without loss of consciousness, initial encounter: Secondary | ICD-10-CM | POA: Insufficient documentation

## 2022-06-26 DIAGNOSIS — S0990XA Unspecified injury of head, initial encounter: Secondary | ICD-10-CM | POA: Diagnosis present

## 2022-06-26 DIAGNOSIS — R519 Headache, unspecified: Secondary | ICD-10-CM

## 2022-06-26 DIAGNOSIS — R42 Dizziness and giddiness: Secondary | ICD-10-CM | POA: Insufficient documentation

## 2022-06-26 DIAGNOSIS — I2510 Atherosclerotic heart disease of native coronary artery without angina pectoris: Secondary | ICD-10-CM | POA: Diagnosis not present

## 2022-06-26 DIAGNOSIS — I609 Nontraumatic subarachnoid hemorrhage, unspecified: Secondary | ICD-10-CM

## 2022-06-26 DIAGNOSIS — W11XXXA Fall on and from ladder, initial encounter: Secondary | ICD-10-CM | POA: Insufficient documentation

## 2022-06-26 MED ORDER — LEVETIRACETAM 500 MG PO TABS: 500.0000 mg | ORAL_TABLET | Freq: Two times a day (BID) | ORAL | 0 refills | Status: DC

## 2022-06-26 MED ORDER — LEVETIRACETAM 500 MG PO TABS
500.0000 mg | ORAL_TABLET | Freq: Once | ORAL | Status: AC
  Administered 2022-06-26: 500 mg via ORAL
  Filled 2022-06-26: qty 1

## 2022-06-26 NOTE — Discharge Instructions (Signed)
Please hold your Brilinta for one week. Please seek medical attention for any worsening headache, change in behavior, persistent vomiting, weakness or numbness in extremities.

## 2022-06-26 NOTE — ED Triage Notes (Signed)
Fall on Monday, fell while changing air filters.  Had headache and had MRI today, showing a subarachnoid bleed.  Patient is AAOx3.  Skin warm and dry NAD

## 2022-06-26 NOTE — Consult Note (Signed)
Consult requested by:  Dr. 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  He is on Brilinta and aspirin for cardiac disease.  His last stent was approximately 10 years ago.  He was changing an air filter when he had a mechanical fall off a ladder.  He hit his right side and hurt his rib cage.  He also had a glancing blow to his head.  He was feeling okay over the last couple days, but had a nagging headache.  It is approximately 5 out of 10.  He has no other concerns at this point.  I have utilized the care everywhere function in epic to review the outside records available from external health systems.  Review of Systems:  A 10 point review of systems is negative, except for the pertinent positives and negatives detailed in the HPI.  Past Medical History: Past Medical History:  Diagnosis Date   Acute MI anterior wall subsequent episode care [facility])    2014   Anxiety    Borderline diabetes    CAD (coronary artery disease)    Dyslipidemia    Dysrhythmia 2014   V-FIB   GERD (gastroesophageal reflux disease)    Thyroid nodule     Past Surgical History: Past Surgical History:  Procedure Laterality Date   CARDIAC CATHETERIZATION     CORONARY ANGIOPLASTY WITH STENT PLACEMENT  2014   2 STENTS IN LAD   ESOPHAGOGASTRODUODENOSCOPY (EGD) WITH PROPOFOL N/A 12/19/2020   Procedure: ESOPHAGOGASTRODUODENOSCOPY (EGD) WITH PROPOFOL;  Surgeon: Earline Mayotte, MD;  Location: [facility] ENDOSCOPY;  Service: Endoscopy;  Laterality: N/A;  1ST CASE   KNEE ARTHROSCOPY Right 12/29/2014   Procedure: ARTHROSCOPY KNEE partial medial menisectomy;  Surgeon: [name], MD;  Location: [facility] ORS;  Service: Orthopedics;  Laterality: Right;   KNEE ARTHROSCOPY Left 05/09/2016   Procedure: ARTHROSCOPY KNEE, PARTIAL MEDIAL MENISECTOMY;  Surgeon: [name], MD;  Location: [facility] ORS;  Service: Orthopedics;  Laterality: Left;   LEFT HEART CATH AND CORONARY ANGIOGRAPHY N/A 07/30/2018   Procedure: LEFT HEART CATH AND CORONARY ANGIOGRAPHY;  Surgeon: Kathleene Hazel, MD;  [facility] INVAS[facility] CV LAB;  Service: Cardiovascular;  Laterality: N/A;   LEFT HEART CATHETERIZATION WITH CORONARY ANGIOGRAM N/A 04/22/2012   Procedure: LEFT HEART CATHETERIZATION WITH CORONARY ANGIOGRAM;  Surgeon: [name], MD;  Location[facility] CATH LAB;  Service: Cardiovascular;  Laterality: N/A;   LEFT HEART CATHETERIZATION WITH CORONARY ANGIOGRAM N/A 04/22/2013   Procedure: LEFT HEART CATHETERIZATION WITH CORONARY ANGIOGRAM;  Surgeon: [name], MD;  Location: [facility] CATH LAB;  Service: Cardiovascular;  Laterality: N/A;   MANDIBLE OSTEOTOMY      Allergies: Allergies as of 06/26/2022 - Review Complete 06/26/2022  Allergen Reaction Noted   Morphine and related Nausea And Vomiting 05/14/2012   Sulfa antibiotics Rash 04/22/2012    Medications: No outpatient medications have been marked as taking for the 06/26/22 encounter [facility] Encounter).    Social History: Social History   Tobacco Use   Smoking status: Former    Types: Cigars    Quit date: 12/26/2012    Years since quitting: 9.5   Smokeless tobacco: Never  Vaping Use   Vaping Use: Never used  Substance Use Topics   Alcohol use: Yes    Alcohol/week: 7.0 standard drinks of alcohol    Types:  7 Glasses of wine per week    Comment: one glass of wine per day   Drug use: No    Family Medical History: Family History  Problem Relation Age of Onset   Hypertension Mother    Stroke Mother    COPD Father    Diabetes Father    Hypertension Father    Congestive Heart Failure Father     Physical Examination: Vitals:   06/26/22 1740  BP: (!) 157/95  Pulse: 80  Resp: 16  Temp: 98.3 F (36.8 C)  SpO2: 95%    General: Patient is well developed, well nourished, calm, collected, and in no  apparent distress. Attention to examination is appropriate.  Neck:   Supple.  Full range of motion.  Respiratory: Patient is breathing without any difficulty.   NEUROLOGICAL:     Awake, alert, oriented to person, place, and time.  Speech is clear and fluent.   Cranial Nerves: Pupils equal round and reactive to light.  Facial tone is symmetric.  Facial sensation is symmetric. Shoulder shrug is symmetric. Tongue protrusion is midline.  There is no pronator drift.    Strength: Side Biceps Triceps Deltoid Interossei Grip Wrist Ext. Wrist Flex.  R 5 5 5 5 5 5 5   L 5 5 5 5 5 5 5    Side Iliopsoas Quads Hamstring PF DF EHL  R 5 5 5 5 5 5   L 5 5 5 5 5 5    Bilateral upper and lower extremity sensation is intact to light touch.    No evidence of dysmetria noted.  Gait is untested.     Medical Decision Making  Imaging: MRI Brain 06/26/2022 IMPRESSION: Trace FLAIR hyperintensity within a high right frontal sulcus, indeterminate but concerning for small amount of subarachnoid hemorrhage given reported trauma. Recommend CT of the head without contrast to further evaluate.   These results will be called to the ordering clinician or representative by the Radiologist Assistant, and communication documented in the PACS or [employer].     Electronically Signed   By: Feliberto Harts M.D.   On: 06/26/2022 14:25  I have personally reviewed the images and agree with the above interpretation.  Assessment and Plan: Patrick Gordon is a pleasant 72 y.o. male with subarachnoid hemorrhage after a fall.  He is on Brilinta for cardiac disease.  He is currently at his neurologic baseline though he has a headache.  His current Glasgow Coma Scale is 15.  He likely is suffering some effects of postconcussive syndrome or cortical irritation from his small amount of subarachnoid hemorrhage.  To evaluate the stability of his hemorrhage, I would recommend a CT scan approximately 6 hours after his  MRI scan.  If that is stable, he will be stable for discharge.  To decrease the risk of early seizure from intracranial hemorrhage, I recommended Keppra 500 mg twice daily for 7 days.  Additionally, I recommend that he hold his Brilinta for 7 days and then restart.  If he has ongoing symptoms, I am happy to see him in the outpatient setting.  He is an established patient of mine.  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    I have communicated my recommendations to the requesting physician and coordinated care to facilitate these recommendations.     Jaymie Mckiddy K. Myer Haff MD, [facility] Neurosurgery

## 2022-06-26 NOTE — ED Provider Notes (Signed)
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  Patient states that he had a fall 3 days ago.  He had been trying to replace an air filter in the ceiling when the ladder slipped and he fell.  He primarily had pain to his right chest and had an outpatient x-ray which showed a rib fracture.  However had MRI done today because he had worsening headache he had some dizziness.  This did show subarachnoid.  Patient is on aspirin and Brilinta although did not take the Brilinta this morning.  He denies any weakness or numbness in his arms or legs.     Physical Exam   Triage Vital Signs: ED Triage Vitals  Enc Vitals Group     BP 06/26/22 1740 (!) 157/95     Pulse Rate 06/26/22 1740 80     Resp 06/26/22 1740 16     Temp 06/26/22 1740 98.3 F (36.8 C)     Temp src --      SpO2 06/26/22 1740 95 %     Weight 06/26/22 1744 192 lb 14.4 oz (87.5 kg)     Height 06/26/22 1744 5\' 8"  (1.727 m)     Head Circumference --      Peak Flow --      Pain Score 06/26/22 1743 5     Pain Loc --      Pain Edu? --      Excl. in GC? --     Most recent vital signs: Vitals:   06/26/22 1740  BP: (!) 157/95  Pulse: 80  Resp: 16  Temp: 98.3 F (36.8 C)  SpO2: 95%   General: Awake, alert, oriented. CV:  Good peripheral perfusion. Regular rate and rhythm. Resp:  Normal effort. Lungs clear. Abd:  No distention. Non tender. Other:  Face symmetric. PERRL. EOMI. Face symmetric. Strength 5/5 in upper and lower extremities.    ED Results / Procedures / Treatments   Labs (all labs ordered are listed, but only abnormal results are displayed) Labs Reviewed - No data to display   EKG  None   RADIOLOGY I independently interpreted  and visualized the CT head. My interpretation: right frontal [facility] Radiology interpretation:  IMPRESSION:  Small volume acute right frontal lobe subarachnoid hemorrhage that  correlates with MRI brain 06/26/2022 findings.     PROCEDURES:  Critical Care performed: No   MEDICATIONS ORDERED IN ED: Medications - No data to display   IMPRESSION / MDM / ASSESSMENT AND PLAN / ED COURSE  I reviewed the triage vital signs and the nursing notes.                              Differential diagnosis includes, but is not limited to, SAH, subdural  Patient's presentation is most consistent with acute presentation with potential threat to life or bodily function.   Patient presented to the emergency department today after outpatient MRI showed subarachnoid hemorrhage.  CT here shows unchanged subarachnoid.  Dr. Myer Haff with neurosurgery had evaluated the patient.  Did recommend starting antiepileptic medication.  Will give patient first dose here in the emergency department.  FINAL CLINICAL IMPRESSION(S) / ED DIAGNOSES   Final diagnoses:  SAH (subarachnoid hemorrhage)     Note:  This document was prepared using Dragon voice recognition software and may include unintentional dictation errors.    Phineas Semen, MD 06/26/22 2118

## 2022-06-30 ENCOUNTER — Telehealth: Payer: Self-pay | Admitting: Neurosurgery

## 2022-06-30 NOTE — Telephone Encounter (Signed)
Pt called following your visit with him in the ER concerning his subarachnoid hemorrhage. York Spaniel you had told him if he had any questions to give you a call. Pt did not specify what his question was, just asked you call him if you had time today 06/30/22 or tomorrow 07/01/22.  CB: [phone]

## 2022-06-30 NOTE — Telephone Encounter (Signed)
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  We will arrange follow up head CT.

## 2022-07-01 ENCOUNTER — Telehealth: Payer: Self-pay

## 2022-07-01 DIAGNOSIS — I609 Nontraumatic subarachnoid hemorrhage, unspecified: Secondary | ICD-10-CM

## 2022-07-01 NOTE — Telephone Encounter (Signed)
-----   Message from Venetia Night, MD sent at 06/30/2022  7:46 PM EDT ----- Patrick Laura do a repeat head CT in ~3-4 weeks and I will call him.

## 2022-07-01 NOTE — Telephone Encounter (Signed)
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

## 2022-07-01 NOTE — Telephone Encounter (Signed)
Order placed for CT head to be done around 07/22/22-07/29/22. Needs telephone call with Dr Myer Haff after

## 2022-07-07 NOTE — Telephone Encounter (Signed)
CT scan 07/25/2022 Telephone visit 07/29/2022

## 2022-07-25 ENCOUNTER — Ambulatory Visit
Admission: RE | Admit: 2022-07-25 | Discharge: 2022-07-25 | Disposition: A | Discharge status: Home / Self Care | Payer: Medicare Other | Source: Ambulatory Visit | Attending: Neurosurgery | Admitting: Neurosurgery

## 2022-07-25 DIAGNOSIS — I609 Nontraumatic subarachnoid hemorrhage, unspecified: Secondary | ICD-10-CM | POA: Diagnosis present

## 2022-07-25 LAB — LAB REPORT - SCANNED
A1c: 5.3
EGFR: 86

## 2022-07-29 ENCOUNTER — Ambulatory Visit (INDEPENDENT_AMBULATORY_CARE_PROVIDER_SITE_OTHER): Payer: Medicare Other | Admitting: Neurosurgery

## 2022-07-29 DIAGNOSIS — I609 Nontraumatic subarachnoid hemorrhage, unspecified: Secondary | ICD-10-CM

## 2022-07-29 NOTE — Progress Notes (Signed)
We reviewed his follow-up CT scan.  He has no further evidence of intracranial hemorrhage.  He has mostly recovered, but does notice some residual symptoms when he turns too fast or leans down.  I suspect all of this will resolve over the coming 1 to 3 months.  His CT scan is reassuring.  He is back on Brilinta.  I will see him back as needed.

## 2022-07-30 NOTE — Progress Notes (Signed)
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 He is s/p anterior STEMI associated with Vfib arrest on 04/22/2012. He underwent emergent stenting of the proximal to mid LAD with 2 long drug-eluting stents. Ejection fraction at cath was 25-30%. Echocardiogram demonstrated an ejection fraction 35%.Subsequent evaluation in [place] showed an EF 51% with small apical defect. He was concerned enough about his cardiac status that he underwent repeat cardiac cath on Feb. 6, 2015. This showed nonobstructive CAD with patent stents. EF was 50-55% with apical wall motion abnormality.   In May 2020 he complained of symptoms of dyspnea on exertion and some chest pressure. This led to a cardiac cath showing nonobstructive CAD and good patency of LAD stents. Good LV function with EF 50-55%. EDP was normal.  He wore a Zio patch monitor in Sept 2021 and this showed rare isolated PACs and PVCs with one PVC couplet. Carotid dopplers showed mild nonobstructive disease on the right ICA. Echo showed normal EF with mild AS mean gradient 11 mm Hg.   He did have upper endoscopy October showing some gastritis. He had ENT evaluation in Dec for hoarseness. Laryngoscopy was OK. He did get mixed up with his meds and was taking amlodipine instead of enalapril.   He was seen in the ED in April with complaints of HA. Had a fall off a ladder 3 days before and had a rib fracture. Began having a HA and went to ED. CT showed a small right frontal SAH. Was seen by Neurosurgery and Brilinta held for 7 days. Follow up CT 07/25/22 was normal. He  did have a recent Echo showing normal LV function and mild AS with mean gradient of 15 mm Hg.  He notes he had 2 bouts of sciatica this past year. This along with his recent injury has limited his exercise. He notes some dyspnea with exertion. No chest pain. He is planning to retire around  labor day.     Current Outpatient Medications on File Prior to Visit  Medication Sig Dispense Refill   aspirin EC 81 MG tablet Take 81 mg by mouth daily.     atorvastatin (LIPITOR) 10 MG tablet Take 1 tablet (10 mg total) by mouth daily. 90 tablet 3   carvedilol (COREG) 12.5 MG tablet Take 1 tablet (12.5 mg total) by mouth 2 (two) times daily. 180 tablet 3   enalapril (VASOTEC) 2.5 MG tablet Take 1 tablet (2.5 mg total) by mouth 2 (two) times daily. 180 tablet 3   omeprazole (PRILOSEC) 40 MG capsule Take 40 mg by mouth daily.     ticagrelor (BRILINTA) 60 MG TABS tablet Take 1 tablet (60 mg total) by mouth 2 (two) times daily. 180 tablet 3   No current facility-administered medications on file prior to visit.    Allergies  Allergen Reactions   Morphine And Codeine Nausea And Vomiting   Sulfa Antibiotics Rash    "FIXED DRUG ERUPTION"    Past Medical History:  Diagnosis Date   Acute MI anterior wall subsequent episode care [facility])    2014   Anxiety    Borderline diabetes    CAD (coronary artery disease)    Dyslipidemia    Dysrhythmia 2014   V-FIB   GERD (gastroesophageal reflux disease)    Thyroid nodule     Past Surgical History:  Procedure Laterality Date   CARDIAC CATHETERIZATION  CORONARY ANGIOPLASTY WITH STENT PLACEMENT  2014   2 STENTS IN LAD   ESOPHAGOGASTRODUODENOSCOPY (EGD) WITH PROPOFOL N/A 12/19/2020   Procedure: ESOPHAGOGASTRODUODENOSCOPY (EGD) WITH PROPOFOL;  Surgeon: Earline Mayotte, MD;  Location: [facility] ENDOSCOPY;  Service: Endoscopy;  Laterality: N/A;  1ST CASE   KNEE ARTHROSCOPY Right 12/29/2014   Procedure: ARTHROSCOPY KNEE partial medial menisectomy;  Surgeon: [name], MD;  Location: [facility] ORS;  Service: Orthopedics;  Laterality: Right;   KNEE ARTHROSCOPY Left 05/09/2016   Procedure: ARTHROSCOPY KNEE, PARTIAL MEDIAL MENISECTOMY;  Surgeon: [name], MD;  Location: [facility] ORS;  Service: Orthopedics;  Laterality: Left;   LEFT HEART CATH AND  CORONARY ANGIOGRAPHY N/A 07/30/2018   Procedure: LEFT HEART CATH AND CORONARY ANGIOGRAPHY;  Surgeon: Kathleene Hazel, MD;  [facility];  Service: Cardiovascular;  Laterality: N/A;   LEFT HEART CATHETERIZATION WITH CORONARY ANGIOGRAM N/A 04/22/2012   Procedure: LEFT HEART CATHETERIZATION WITH CORONARY ANGIOGRAM;  Surgeon: Jaivon Vanbeek M Sw[place], MD;  Location: [facility] CATH LAB;  Service: Cardiovascular;  Laterality: N/A;   LEFT HEART CATHETERIZATION WITH CORONARY ANGIOGRAM N/A 04/22/2013   Procedure: LEFT HEART CATHETERIZATION WITH CORONARY ANGIOGRAM;  Surgeon: Lopaka Karge M [place], MD;  Location: [facility] CATH LAB;  Service: Cardiovascular;  Laterality: N/A;   MANDIBLE OSTEOTOMY      Social History   Tobacco Use  Smoking Status Former   Types: Cigars   Quit date: 12/26/2012   Years since quitting: 9.6  Smokeless Tobacco Never    Social History   Substance and Sexual Activity  Alcohol Use Yes   Alcohol/week: 7.0 standard drinks of alcohol   Types: 7 Glasses of wine per week   Comment: one glass of wine per day    Family History  Problem Relation Age of Onset   Hypertension Mother    Stroke Mother    COPD Father    Diabetes Father    Hypertension Father    Congestive Heart Failure Father     Review of Systems: As noted in history of present illness.   All other systems were reviewed and are negative.  Physical Exam: BP 124/66 (BP Location: Left Arm, Patient Position: Sitting, Cuff Size: Normal)   Pulse 83   Ht 5\' 8"  (1.727 m)   Wt 191 lb 12.8 oz (87 kg)   SpO2 98%   BMI 29.16 kg/m  GENERAL:  Well appearing male in NAD HEENT:  PERRL, EOMI, sclera are clear. Oropharynx is clear. NECK:  No jugular venous distention, carotid upstroke brisk and symmetric, bilateral  bruits, no thyromegaly or adenopathy LUNGS:  Clear to auscultation bilaterally CHEST:  Unremarkable HEART:  RRR,  PMI not displaced or sustained,S1 and S2 within normal limits, no S3, no S4: no clicks, no rubs,  gr 2/6 systolic murmur at the apex/RUSB ABD:  Soft, nontender. BS +, no masses or bruits. No hepatomegaly, no splenomegaly EXT:  2 + pulses throughout, no edema, no cyanosis no clubbing SKIN:  Warm and dry.  No rashes NEURO:  Alert and oriented x 3. Cranial nerves II through XII intact. PSYCH:  Cognitively intact    LABORATORY DATA: Labs reviewed from 02/11/16: Normal CMET. gluose 139. cholesterol 105, trig 150, HDL 43, LDL 32. CBC normal. A1c 5.5%. Normal thyroid studies. Dated 05/26/17: Normal CMET. Cholesterol 108, triglycerides 77, HDL 49, LDL 44. TFTs normal. CBC normal. A1c 5%. CRP 0.59. Dated 04/05/18: glucose 121, otherwise chemistries normal. Cholesterol 120, triglycerides 180, HDL 36, LDL 48. TSH and CBC normal. A1c  5.7%.  Dated 07/09/18: glucose 136, otherwise CMET normal. Uric acid normal. Cholesterol 99, triglycerides 76, HDL 40, LDL 44. CBC and TSH normal. A1c 5.4%.  Dated 07/20/19: glucose 105. Otherwise CMET normal. Cholesterol 71, triglycerides 72, HDL 31, LDL 24. TFTS and CBC normal. A1c 5.4%.  Dated 05/22/21: cholesterol 103, triglycerides 65, HDL 42, LDL 47. Glucose 113, A1c 5.4%. CMET and CBC normal. TFTs normal.  Dated 07/24/22: glucose 118, uric acid 3.7. CMET normal. Cholesterol 96, triglycerides 77, HDL 36, LDL 44. TFTs and CBC normal. A1c 5.3%. CRP 0.28.    Echo done 05/26/17: normal LV function. Mild LVH. LAE. No valvular disease.   Cardiac cath 07/30/18:  LEFT HEART CATH AND CORONARY ANGIOGRAPHY  Conclusion      Prox RCA lesion is 40% stenosed. Mid RCA lesion is 20% stenosed. 2nd Mrg lesion is 20% stenosed. Prox LAD to Mid LAD lesion is 10% stenosed. The left ventricular systolic function is normal. LV end diastolic pressure is normal. The left ventricular ejection fraction is greater than 65% by visual estimate. There is no mitral valve regurgitation.   1. Patent stent proximal and mid LAD with minimal restenosis.  2. Mild non-obstructive disease in the  Circumflex system 3. Moderate non-obstructive disease in the mid RCA. This does not appear to be flow limiting.  4. Preserved LV systolic function with apical hypokinesis   Recommendations: Medical management of CAD       Assessment / Plan: 1. CAD with remote Anterior STEMI complicated by ventricular fibrillation arrest in 2014. Status post DES x2 to the proximal and mid LAD in February 2014. Repeat cardiac cath in May 2020 showed nonobstructive disease and EF improved.   Continue DAPT with reduced dose Brilinta indefinitely as long as no bleeding problems. He has stable class 1 angina.  2. Ischemic cardiomyopathy. Initial Ejection fraction 25-35% with MI. Now improved to normal based on last Echo. Cardiac cath in May 2020 showed EF 55% on recent Echo. Continue current doses of ACE inhibitor and carvedilol.   3. Dyslipidemia. Continue statin therapy. Dietary modification. Excellent labs.   4. Prediabetes. Encourage  aerobic activity and weight loss. Last A1c 5.3%.   5. Mild AS.  Recommend Echo every 3 years.   6. Carotid bruits. Nonobstructive disease by doppler in 2021.   7. Recent fall with rib fracture and small SAH- resolved.   Follow up in one year

## 2022-08-05 ENCOUNTER — Encounter: Payer: Self-pay | Admitting: Cardiology

## 2022-08-05 ENCOUNTER — Ambulatory Visit: Payer: Medicare Other | Attending: Cardiology | Admitting: Cardiology

## 2022-08-05 VITALS — BP 124/66 | HR 83 | Ht 68.0 in | Wt 191.8 lb

## 2022-08-05 DIAGNOSIS — E785 Hyperlipidemia, unspecified: Secondary | ICD-10-CM | POA: Insufficient documentation

## 2022-08-05 DIAGNOSIS — I252 Old myocardial infarction: Secondary | ICD-10-CM | POA: Insufficient documentation

## 2022-08-05 DIAGNOSIS — I2510 Atherosclerotic heart disease of native coronary artery without angina pectoris: Secondary | ICD-10-CM

## 2022-08-05 DIAGNOSIS — I350 Nonrheumatic aortic (valve) stenosis: Secondary | ICD-10-CM | POA: Diagnosis present

## 2022-08-05 NOTE — Patient Instructions (Signed)
Medication Instructions:  Continue same medications *If you need a refill on your cardiac medications before your next appointment, please call your pharmacy*   Lab Work: None ordered   Testing/Procedures: None ordered   Follow-Up: At [facility], you and your health needs are our priority.  As part of our continuing mission to provide you with exceptional heart care, we have created designated Provider Care Teams.  These Care Teams include your primary Cardiologist (physician) and Advanced Practice Providers (APPs -  Physician Assistants and Nurse Practitioners) who all work together to provide you with the care you need, when you need it.  We recommend signing up for the patient portal called "MyChart".  Sign up information is provided on this After Visit Summary.  MyChart is used to connect with patients for Virtual Visits (Telemedicine).  Patients are able to view lab/test results, encounter notes, upcoming appointments, etc.  Non-urgent messages can be sent to your provider as well.   To learn more about what you can do with MyChart, go to [email or web address].    Your next appointment:  1 year   Call in Feb to schedule May appointment     Provider:  Dr.Jordan   

## 2022-12-16 ENCOUNTER — Ambulatory Visit (INDEPENDENT_AMBULATORY_CARE_PROVIDER_SITE_OTHER): Payer: Medicare Other | Admitting: Neurosurgery

## 2022-12-16 ENCOUNTER — Encounter: Payer: Self-pay | Admitting: Neurosurgery

## 2022-12-16 ENCOUNTER — Telehealth: Payer: Self-pay

## 2022-12-16 VITALS — BP 138/80 | Ht 68.0 in | Wt 191.0 lb

## 2022-12-16 DIAGNOSIS — M5416 Radiculopathy, lumbar region: Secondary | ICD-10-CM

## 2022-12-16 DIAGNOSIS — M25561 Pain in right knee: Secondary | ICD-10-CM

## 2022-12-16 DIAGNOSIS — M4316 Spondylolisthesis, lumbar region: Secondary | ICD-10-CM | POA: Diagnosis not present

## 2022-12-16 DIAGNOSIS — M5441 Lumbago with sciatica, right side: Secondary | ICD-10-CM

## 2022-12-16 MED ORDER — CYCLOBENZAPRINE HCL 10 MG PO TABS: 10.0000 mg | ORAL_TABLET | Freq: Three times a day (TID) | ORAL | 0 refills | Status: DC | PRN

## 2022-12-16 MED ORDER — GABAPENTIN 100 MG PO CAPS: 100.0000 mg | ORAL_CAPSULE | Freq: Three times a day (TID) | ORAL | 0 refills | Status: DC

## 2022-12-16 NOTE — Telephone Encounter (Signed)
Authorization submitted through Cover My Meds for flexeril.   PA Case ID #: [id number]

## 2022-12-16 NOTE — Progress Notes (Signed)
Referring Physician:  No referring provider defined for this encounter.  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  During this time, he is aggravated his back and right leg.  He has pain primarily around his right knee.  He also has pain around his right buttock at times.  He denies numbness or tingling.  This is worsened primarily by walking.  He has tried 3 rounds of prednisone.  The first round of prednisone made his symptoms go almost completely away, but his symptoms returned and he started additional prednisone.     12/26/2021 [name] is here today with a chief complaint of right leg, buttock, and lateral calf pain.  He began having pain around [place] Day weekend this year.  He was having significant right buttock pain with worse pain down his anterolateral calf particular when he walked or stood.  His pain resolved after a few weeks but has since come back on 2 different occasions, most recently this past weekend.  His pain can be as bad as 10 out of 10 requiring him to sit in a rolling chair to work.  He had an L2-3 disc herniation many years ago, but that improved with conservative management.  He has taken both prednisone and Flexeril during this exacerbation with good response.   Bowel/Bladder Dysfunction: none  Conservative measures:  Physical therapy:  has not participated Multimodal medical therapy including regular antiinflammatories:  oxycodone, gabapentin, prednisone, skelaxin, cyclobenzaprine Injections:  has not received epidural steroid injections  Past Surgery:  denies  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  The symptoms are causing a significant impact on the patient's life.    Review of Systems:  A 10 point review of systems is negative, except for the pertinent positives and negatives detailed in the HPI.  Past Medical History: Past Medical History:  Diagnosis Date   Acute MI anterior wall subsequent episode care [facility])    2014   Anxiety    Borderline diabetes    CAD (coronary artery disease)    Dyslipidemia    Dysrhythmia 2014   V-FIB   GERD (gastroesophageal reflux disease)    Thyroid nodule     Past Surgical History: Past Surgical History:  Procedure Laterality Date   CARDIAC CATHETERIZATION     CORONARY ANGIOPLASTY WITH STENT PLACEMENT  2014   2 STENTS IN LAD   ESOPHAGOGASTRODUODENOSCOPY (EGD) WITH PROPOFOL N/A 12/19/2020   Procedure: ESOPHAGOGASTRODUODENOSCOPY (EGD) WITH PROPOFOL;  Surgeon: Earline Mayotte, MD;  Location: [facility] ENDOSCOPY;  Service: Endoscopy;  Laterality: N/A;  1ST CASE   KNEE ARTHROSCOPY Right 12/29/2014   Procedure: ARTHROSCOPY KNEE partial medial menisectomy;  Surgeon: [name], MD;  Location: [facility] ORS;  Service: Orthopedics;  Laterality: Right;   KNEE ARTHROSCOPY Left 05/09/2016   Procedure: ARTHROSCOPY KNEE, PARTIAL MEDIAL MENISECTOMY;  Surgeon: [name], MD;  Location: [facility] ORS;  Service: Orthopedics;  Laterality: Left;   LEFT HEART CATH AND CORONARY ANGIOGRAPHY N/A 07/30/2018   Procedure: LEFT HEART CATH AND CORONARY ANGIOGRAPHY;  Surgeon: Kathleene Hazel, MD;  [facility];  Service: Cardiovascular;  Laterality: N/A;   LEFT HEART CATHETERIZATION WITH CORONARY ANGIOGRAM N/A 04/22/2012   Procedure: LEFT HEART  CATHETERIZATION WITH CORONARY ANGIOGRAM;  Surgeon: [name], MD;  Location[facility] CATH LAB;  Service: Cardiovascular;  Laterality: N/A;   LEFT HEART CATHETERIZATION WITH CORONARY ANGIOGRAM N/A 04/22/2013   Procedure: LEFT HEART CATHETERIZATION WITH CORONARY ANGIOGRAM;  Surgeon: [name], MD;  Location: [facility] CATH LAB;  Service: Cardiovascular;  Laterality: N/A;   MANDIBLE  OSTEOTOMY      Allergies: Allergies as of 12/16/2022 - Review Complete 12/16/2022  Allergen Reaction Noted   Morphine and codeine Nausea And Vomiting 05/14/2012   Sulfa antibiotics Rash 04/22/2012    Medications: Current Meds  Medication Sig   aspirin EC 81 MG tablet Take 81 mg by mouth daily.   atorvastatin (LIPITOR) 10 MG tablet Take 1 tablet (10 mg total) by mouth daily.   carvedilol (COREG) 12.5 MG tablet Take 1 tablet (12.5 mg total) by mouth 2 (two) times daily.   cyclobenzaprine (FLEXERIL) 10 MG tablet Take 1 tablet (10 mg total) by mouth 3 (three) times daily as needed for muscle spasms.   enalapril (VASOTEC) 2.5 MG tablet Take 1 tablet (2.5 mg total) by mouth 2 (two) times daily.   gabapentin (NEURONTIN) 100 MG capsule Take 1 capsule (100 mg total) by mouth 3 (three) times daily.   metaxalone (SKELAXIN) 800 MG tablet Take 800 mg by mouth 3 (three) times daily.   omeprazole (PRILOSEC) 40 MG capsule Take 40 mg by mouth daily.   predniSONE (STERAPRED UNI-PAK 48 TAB) 10 MG (48) TBPK tablet Take by mouth daily.   ticagrelor (BRILINTA) 60 MG TABS tablet Take 1 tablet (60 mg total) by mouth 2 (two) times daily.    Social History: Social History   Tobacco Use   Smoking status: Former    Types: Cigars    Quit date: 12/26/2012    Years since quitting: 9.9   Smokeless tobacco: Never  Vaping Use   Vaping status: Never Used  Substance Use Topics   Alcohol use: Yes    Alcohol/week: 7.0 standard drinks of alcohol    Types: 7 Glasses of wine per week    Comment: one glass of wine per day   Drug use: No    Family Medical History: Family History  Problem Relation Age of Onset   Hypertension Mother    Stroke Mother    COPD Father    Diabetes Father    Hypertension Father    Congestive Heart Failure Father     Physical Examination: Vitals:   12/16/22 1427  BP: 138/80    General: Patient is well developed, well nourished, calm, collected, and in no apparent distress.  Attention to examination is appropriate.  Neck:   Supple.  Full range of motion.  Respiratory: Patient is breathing without any difficulty.   NEUROLOGICAL:     Awake, alert, oriented to person, place, and time.  Speech is clear and fluent. Fund of knowledge is appropriate.   Cranial Nerves: Pupils equal round and reactive to light.  Facial tone is symmetric.  Facial sensation is symmetric. Shoulder shrug is symmetric. Tongue protrusion is midline.  There is no pronator drift.  ROM of spine: full.    Strength: Side Biceps Triceps Deltoid Interossei Grip Wrist Ext. Wrist Flex.  R 5 5 5 5 5 5 5   L 5 5 5 5 5 5 5    Side Iliopsoas Quads Hamstring PF DF EHL  R 5 5 5 5 5 5   L 5 5 5 5 5 5    Reflexes are 1+ and symmetric  at the biceps, triceps, brachioradialis, but 2+ left patella and 1+ right patella.   Hoffman's is absent.   Bilateral upper and lower extremity sensation is intact to light touch.    No evidence of dysmetria noted.  Gait is normal.    Straight leg raise negative to 60 degrees.  +TTP R medial knee.   Medical Decision Making  Imaging: MRI L spine 08/15/21 IMPRESSION: Multilevel degenerative changes of the lumbar spine, worst at L2-L3, L3-L4, and L4-L5, as summarized below:   L1-L2: Mild right subarticular stenosis. No neural foraminal stenosis.   L2-L3: Mild retrolisthesis. Left greater than right subarticular stenosis and moderate left neural foraminal stenosis. Contact with the descending left L3 nerve root.   L3-L4: Trace retrolisthesis with disc bulging and biforaminal disc protrusions. Moderate to severe left-sided subarticular stenosis with potential for impingement of the descending left L4 nerve root. Moderate bilateral neural foraminal stenosis, left worse than right.   L4-L5: Grade 1 anterolisthesis. Moderate spinal canal stenosis and subarticular stenosis, left worse than right, potentially impinging the descending left L5 nerve root.  Moderate-severe left neural foraminal stenosis and contact with the exiting left L4 nerve root. Mild to moderate right neural foraminal stenosis.   L5-S1: Mild right neural foraminal stenosis primarily due to facet spurring.     Electronically Signed   By: Caprice Renshaw M.D.   On: 08/15/2021 09:59  I have personally reviewed the images and agree with the above interpretation.  Assessment and Plan: [name] is a pleasant 72 y.o. male with possible right sided L4 or L5 radiculopathy with anterolisthesis of L4 and L5.  He has past history of B knee operations, and has some pain around his knee.  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   I think his symptoms could be from a lumbar radiculopathy due to his anterolisthesis, or from exacerbation of his R knee, or both.  He is on his 3rd steroid taper.  I recommended considering a switch to NSAIDs such as ibuprofen 600 mg TID or naproxen 2 pills OTC BID.  Additionally, I sent in gabapentin 100 mg TID and flexeril 10 mg TID PRN. I have given him knee and back exercises.   We will touch base in 3 weeks or so.  If he is improved, we will not have additional workup. If he is still having symptoms, we will discuss ESI and possible imaging. Even with an L spine ESI, he may additionally require R knee injection to help determine what is the actual cause of his symptoms. He may end up needing a repeat MRI of his back depending on how his symptoms proceed.  I spent a total of 20 minutes in this patient's care today. This time was spent reviewing pertinent records including imaging studies, obtaining and confirming history, performing a directed evaluation, formulating and discussing my recommendations, and documenting the visit within the medical record.      Patrick Clouse K. Myer Haff MD, [facility]

## 2022-12-16 NOTE — Telephone Encounter (Signed)
Flexeril has been approved and is valid from 12/16/22 to 01/15/23.   I have faxed approval to the pharmacy.

## 2023-01-13 ENCOUNTER — Ambulatory Visit (INDEPENDENT_AMBULATORY_CARE_PROVIDER_SITE_OTHER): Payer: Medicare Other | Admitting: Neurosurgery

## 2023-01-13 DIAGNOSIS — M5441 Lumbago with sciatica, right side: Secondary | ICD-10-CM

## 2023-01-13 NOTE — Progress Notes (Signed)
Referring Physician:  [name], MD [address] [place],  [place] [address]  Primary Physician:  [name], MD  History of Present Illness: 01/13/2023 Patrick Gordon is doing much better.  His pain is much improved.  He is going to start walking for exercise in the next week or so.   12/16/2022 Patrick Gordon returns to see me.  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  During this time, he is aggravated his back and right leg.  He has pain primarily around his right knee.  He also has pain around his right buttock at times.  He denies numbness or tingling.  This is worsened primarily by walking.  He has tried 3 rounds of prednisone.  The first round of prednisone made his symptoms go almost completely away, but his symptoms returned and he started additional prednisone.     12/26/2021 [name] is here today with a chief complaint of right leg, buttock, and lateral calf pain.  He began having pain around [place] weekend this year.  He was having significant right buttock pain with worse pain down his anterolateral calf particular when he walked or stood.  His pain resolved after a few weeks but has since come back on 2 different occasions, most recently this past weekend.  His pain can be as bad as 10 out of 10 requiring him to sit in a rolling chair to work.  He had an L2-3 disc herniation many years ago, but that improved with conservative management.  He has taken both prednisone and Flexeril during this exacerbation with good response.   Bowel/Bladder Dysfunction: none  Conservative measures:  Physical therapy:  has not participated Multimodal medical therapy including regular antiinflammatories:  oxycodone, gabapentin, prednisone, skelaxin, cyclobenzaprine Injections:  has not received epidural steroid injections  Past Surgery:   denies  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  The symptoms are causing a significant impact on the patient's life.   Review of Systems:  A 10 point review of systems is negative, except for the pertinent positives and negatives detailed in the HPI.  Past Medical History: Past Medical History:  Diagnosis Date   Acute MI anterior wall subsequent episode care [facility])    2014   Anxiety    Borderline diabetes    CAD (coronary artery disease)    Dyslipidemia    Dysrhythmia 2014   V-FIB   GERD (gastroesophageal reflux disease)    Thyroid nodule     Past Surgical History: Past Surgical History:  Procedure Laterality Date   CARDIAC CATHETERIZATION     CORONARY ANGIOPLASTY WITH STENT PLACEMENT  2014   2 STENTS IN LAD   ESOPHAGOGASTRODUODENOSCOPY (EGD) WITH PROPOFOL N/A 12/19/2020   Procedure: ESOPHAGOGASTRODUODENOSCOPY (EGD) WITH PROPOFOL;  Surgeon: Earline Mayotte, MD;  Location: [facility] ENDOSCOPY;  Service: Endoscopy;  Laterality: N/A;  1ST CASE   KNEE ARTHROSCOPY Right 12/29/2014   Procedure: ARTHROSCOPY KNEE partial medial menisectomy;  Surgeon: [name], MD;  Location: [facility] ORS;  Service: Orthopedics;  Laterality: Right;   KNEE ARTHROSCOPY Left 05/09/2016   Procedure: ARTHROSCOPY KNEE, PARTIAL MEDIAL MENISECTOMY;  Surgeon: [name], MD;  Location: [facility] ORS;  Service: Orthopedics;  Laterality: Left;   LEFT HEART CATH AND CORONARY ANGIOGRAPHY N/A 07/30/2018   Procedure: LEFT HEART CATH AND CORONARY ANGIOGRAPHY;  Surgeon: Kathleene Hazel, MD;  Location: [facility] INVASIVE CV LAB;  Service: Cardiovascular;  Laterality: N/A;   LEFT HEART CATHETERIZATION WITH CORONARY ANGIOGRAM N/A 04/22/2012   Procedure: LEFT HEART CATHETERIZATION WITH CORONARY ANGIOGRAM;  Surgeon: [name], MD;  Location: [facility] CATH LAB;  Service: Cardiovascular;  Laterality: N/A;   LEFT HEART CATHETERIZATION WITH CORONARY ANGIOGRAM N/A 04/22/2013   Procedure: LEFT HEART  CATHETERIZATION WITH CORONARY ANGIOGRAM;  Surgeon: [name], MD;  Location: [facility] CATH LAB;  Service: Cardiovascular;  Laterality: N/A;   MANDIBLE OSTEOTOMY      Allergies: Allergies as of 01/13/2023 - Review Complete 12/16/2022  Allergen Reaction Noted   Morphine and codeine Nausea And Vomiting 05/14/2012   Sulfa antibiotics Rash 04/22/2012    Medications: No outpatient medications have been marked as taking for the 01/13/23 encounter (Appointment) with Venetia Night, MD.    Social History: Social History   Tobacco Use   Smoking status: Former    Types: Cigars    Quit date: 12/26/2012    Years since quitting: 10.0   Smokeless tobacco: Never  Vaping Use   Vaping status: Never Used  Substance Use Topics   Alcohol use: Yes    Alcohol/week: 7.0 standard drinks of alcohol    Types: 7 Glasses of wine per week    Comment: one glass of wine per day   Drug use: No    Family Medical History: Family History  Problem Relation Age of Onset   Hypertension Mother    Stroke Mother    COPD Father    Diabetes Father    Hypertension Father    Congestive Heart Failure Father     Physical Examination: Telephone visit today   Medical Decision Making  Imaging: MRI L spine 08/15/21 IMPRESSION: Multilevel degenerative changes of the lumbar spine, worst at L2-L3, L3-L4, and L4-L5, as summarized below:   L1-L2: Mild right subarticular stenosis. No neural foraminal stenosis.   L2-L3: Mild retrolisthesis. Left greater than right subarticular stenosis and moderate left neural foraminal stenosis. Contact with the descending left L3 nerve root.   L3-L4: Trace retrolisthesis with disc bulging and biforaminal disc protrusions. Moderate to severe left-sided subarticular stenosis with potential for impingement of the descending left L4 nerve root. Moderate bilateral neural foraminal stenosis, left worse than right.   L4-L5: Grade 1 anterolisthesis. Moderate spinal canal  stenosis and subarticular stenosis, left worse than right, potentially impinging the descending left L5 nerve root. Moderate-severe left neural foraminal stenosis and contact with the exiting left L4 nerve root. Mild to moderate right neural foraminal stenosis.   L5-S1: Mild right neural foraminal stenosis primarily due to facet spurring.     Electronically Signed   By: Caprice Renshaw M.D.   On: 08/15/2021 09:59  I have personally reviewed the images and agree with the above interpretation.  Assessment and Plan: Patrick Gordon is a pleasant 73 y.o. male with improvement in his pain.  At this point, I would not recommend further intervention.  I am available to him if he has any exacerbations in the future.  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   This visit was performed via telephone.  Patient location: home Provider location: office  I spent a total of 4 minutes non-face-to-face activities for this visit on the date of this encounter including review of current  clinical condition and response to treatment.  The patient is aware of and accepts the limits of this telehealth visit.      Tymesha Ditmore K. Myer Haff MD, [facility]

## 2023-03-28 LAB — LAB REPORT - SCANNED
A1c: 4.9
EGFR: 92
PSA, Total: 0.63
PSA, Total: 0.63
TSH: 1.28 (ref 0.41–5.90)

## 2023-04-15 ENCOUNTER — Telehealth: Payer: Self-pay | Admitting: *Deleted

## 2023-04-15 NOTE — Telephone Encounter (Signed)
   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 Swaziland Date of next office visit: NONE   Request for Surgical Clearance    Procedure:  HERNIA SURGERY  Date of Surgery:  Clearance TBD                                Surgeon:  DR. Axel Filler Surgeon's Group or Practice Name:  [facility] Phone number:  [phone] Fax number:  [phone] ATTN: Brennan Bailey , CMA   Type of Clearance Requested:   - Medical  - Pharmacy:  Hold Ticagrelor (Brilinta)     Type of Anesthesia:  General    Additional requests/questions:    Elpidio Anis   04/15/2023, 5:59 PM

## 2023-04-16 NOTE — Telephone Encounter (Signed)
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  Procedure has not yet been scheduled.  He was last seen in the clinic by you on 08/05/2022.  His PMH includes anterior STEMI 2/14.  He underwent emergent stenting of his proximal-mid LAD with DES x 2.  His EF was noted to be 25-30%.  Subsequent echocardiogram showed an EF improved to 51%.  He underwent repeat cardiac catheterization 2/15 which showed nonobstructive CAD and patent stents.  His EF at that time was noted to be 50-55%.  He presented to the emergency department with complaints of headache after falling off of a ladder 3 days prior.  He was noted to have rib fracture.  Head CT showed small right frontal SAH.  Neurosurgery held his Brilinta.  On follow-up with cardiology 08/05/2022 he was doing well from a cardiac standpoint.  He denied chest pain.  He was planning to retire around Labor Day.  May his Brilinta be held prior to his surgery?  Thank you for your help.  Please direct your response to CV DIV preop pool.   Thomasene Ripple. Patrick Brewton NP-C     04/16/2023, 9:54 AM [facility] [address] Office [phone] Fax [phone]

## 2023-04-16 NOTE — Telephone Encounter (Signed)
   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 Please obtain consent and complete medication review. Thank you for your help.  I confirm that guidance regarding antiplatelet and oral anticoagulation therapy has been completed and, if necessary, noted below.  His Brilinta may be held for 5 days prior to his surgery.  Please resume as soon as hemostasis is achieved  I also confirmed the patient resides in the state of [place]. As per [facility] Medical Board telemedicine laws, the patient must reside in the state in which the provider is licensed.   Ronney Asters, NP 04/16/2023, 10:27 AM [facility]

## 2023-04-20 ENCOUNTER — Telehealth: Payer: Self-pay | Admitting: *Deleted

## 2023-04-20 ENCOUNTER — Encounter: Payer: Self-pay | Admitting: Neurosurgery

## 2023-04-20 NOTE — Telephone Encounter (Signed)
Pt has been scheduled for a tele visit, 05/04/23 9:20.  Consent on file / medications reconciled.    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   CONSENT FOR VIRTUAL VISIT FOR:  Patrick Gordon, Dr.  By participating in this virtual visit I agree to the following:  I hereby voluntarily request, consent and authorize [facility] and its employed or contracted physicians, physician assistants, nurse practitioners or other licensed health care professionals (the Practitioner), to provide me with telemedicine health care services (the "Services") as deemed necessary by the treating Practitioner. I acknowledge and consent to receive the Services by the Practitioner via telemedicine. I understand that the telemedicine visit will involve communicating with the Practitioner through live audiovisual communication technology and the disclosure of certain medical information by electronic transmission. I acknowledge that I have been given the opportunity to request an in-person assessment or other available alternative prior to the telemedicine visit and am voluntarily participating in the telemedicine visit.  I understand that I have the right to withhold or withdraw my consent to the use of telemedicine in the course of my care at any time, without affecting my right to future care or treatment, and that the Practitioner or I may terminate the telemedicine visit at any time. I understand that I have the right to inspect all information obtained and/or recorded in the course of the telemedicine visit and may receive copies of available information for a reasonable fee.  I understand that some of the potential risks of receiving the Services via telemedicine include:  Delay or interruption in medical evaluation due to technological equipment failure or disruption; Information transmitted may not  be sufficient (e.g. poor resolution of images) to allow for appropriate medical decision making by the Practitioner; and/or  In rare instances, security protocols could fail, causing a breach of personal health information.  Furthermore, I acknowledge that it is my responsibility to provide information about my medical history, conditions and care that is complete and accurate to the best of my ability. I acknowledge that Practitioner's advice, recommendations, and/or decision may be based on factors not within their control, such as incomplete or inaccurate data provided by me or distortions of diagnostic images or specimens that may result from electronic transmissions. I understand that the practice of medicine is not an exact science and that Practitioner makes no warranties or guarantees regarding treatment outcomes. I acknowledge that a copy of this consent can be made available to me via my patient portal [facility] MyChart), or I can request a printed copy by calling the office of [facility].    I understand that my insurance will be billed for this visit.   I have read or had this consent read to me. I understand the contents of this consent, which adequately explains the benefits and risks of the Services being provided via telemedicine.  I have been provided ample opportunity to ask questions regarding this consent and the Services and have had my questions answered to my satisfaction. I give my informed consent for the services to be provided through the use of telemedicine in my medical care

## 2023-04-20 NOTE — Telephone Encounter (Signed)
Pt has been scheduled for a tele visit, 05/04/23 9:20.  Consent on file / medications reconciled.

## 2023-04-20 NOTE — Telephone Encounter (Signed)
1st attempt to reach pt regarding surgical clearance and the need for a tele visit.  Left a message for pt to call back.

## 2023-05-04 ENCOUNTER — Ambulatory Visit: Payer: Medicare Other | Attending: Cardiovascular Disease | Admitting: Student

## 2023-05-04 DIAGNOSIS — Z01810 Encounter for preprocedural cardiovascular examination: Secondary | ICD-10-CM | POA: Diagnosis not present

## 2023-05-04 NOTE — Progress Notes (Signed)
 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  This format is felt to be most appropriate for this patient at this time.  The patient did not have access to video technology/had technical difficulties with video requiring transitioning to audio format only (telephone).  All issues noted in this document were discussed and addressed.  No physical exam could be performed with this format.  Please refer to the patient's chart for his consent to telehealth for [facility].  Evaluation Performed:  Preoperative cardiovascular risk assessment _____________   Date:  05/04/2023   Patient ID:  Patrick Gordon, Dr., DOB July 10, 1950, MRN [id number] Patient Location:  Home Provider location:   Office  Primary Care Provider:  [name], MD Primary Cardiologist:  Peter Swaziland, MD  Chief Complaint / Patient Profile   73 y.o. y/o male with a h/o heart CAD s/p PCI with DES x 2 to LAD February 2014, V-fib arrest February 2014, ischemic cardiomyopathy, hyperlipidemia, GERD, subarachnoid hemorrhage, T2DM who is pending hernia repair by Dr. Derrell Lolling and presents today for telephonic preoperative cardiovascular risk assessment.  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  Pt was last seen in cardiology clinic on 08/05/2022 by Dr. Swaziland.  At that time [facility], Dr. was stable from a cardiac standpoint.  The patient is now pending procedure as outlined above. Since his last visit, he is doing well. Patient denies shortness of breath, dyspnea on exertion, lower extremity edema, orthopnea or PND. No chest pain, pressure, or tightness. No palpitations.  He has not been able to do his normal walking routine for about a year secondary to flares of sciatica. He is able to  perform light to moderate household activities without cardiac symptoms.   Past Medical History    Past Medical History:  Diagnosis Date   Acute MI anterior wall subsequent episode care [facility])    2014   Anxiety    Borderline diabetes    CAD (coronary artery disease)    Dyslipidemia    Dysrhythmia 2014   V-FIB   GERD (gastroesophageal reflux disease)    Thyroid nodule    Past Surgical History:  Procedure Laterality Date   CARDIAC CATHETERIZATION     CORONARY ANGIOPLASTY WITH STENT PLACEMENT  2014   2 STENTS IN LAD   ESOPHAGOGASTRODUODENOSCOPY (EGD) WITH PROPOFOL N/A 12/19/2020   Procedure: ESOPHAGOGASTRODUODENOSCOPY (EGD) WITH PROPOFOL;  Surgeon: Earline Mayotte, MD;  Location: [facility] ENDOSCOPY;  Service: Endoscopy;  Laterality: N/A;  1ST CASE   KNEE ARTHROSCOPY Right 12/29/2014   Procedure: ARTHROSCOPY KNEE partial medial menisectomy;  Surgeon: [name], MD;  Location: [facility] ORS;  Service: Orthopedics;  Laterality: Right;   KNEE ARTHROSCOPY Left 05/09/2016   Procedure: ARTHROSCOPY KNEE, PARTIAL MEDIAL MENISECTOMY;  Surgeon: [name], MD;  Location: [facility] ORS;  Service: Orthopedics;  Laterality: Left;   LEFT HEART CATH AND CORONARY ANGIOGRAPHY N/A 07/30/2018   Procedure: LEFT HEART CATH AND CORONARY ANGIOGRAPHY;  Surgeon: Kathleene Hazel, MD;  Location: [facility] INVASIVE CV LAB;  Service: Cardiovascular;  Laterality: N/A;   LEFT HEART CATHETERIZATION WITH CORONARY ANGIOGRAM N/A 04/22/2012   Procedure: LEFT HEART CATHETERIZATION WITH CORONARY ANGIOGRAM;  Surgeon: [name], MD;  Location: [facility] CATH LAB;  Service: Cardiovascular;  Laterality: N/A;  LEFT HEART CATHETERIZATION WITH CORONARY ANGIOGRAM N/A 04/22/2013   Procedure: LEFT HEART CATHETERIZATION WITH CORONARY ANGIOGRAM;  Surgeon: [name], MD;  Location: [facility] CATH LAB;  Service: Cardiovascular;  Laterality: N/A;   MANDIBLE OSTEOTOMY      Allergies  Allergies  Allergen Reactions   Morphine And Codeine Nausea  And Vomiting   Sulfa Antibiotics Rash    "FIXED DRUG ERUPTION"    Home Medications    Prior to Admission medications   Medication Sig Start Date End Date Taking? Authorizing Provider  aspirin EC 81 MG tablet Take 81 mg by mouth daily.    [provider]  atorvastatin (LIPITOR) 10 MG tablet Take 1 tablet (10 mg total) by mouth daily. 03/23/15   Sw[place]il[place], Peter M, MD  carvedilol (COREG) 12.5 MG tablet Take 1 tablet (12.5 mg total) by mouth 2 (two) times daily. 03/23/15   [place], Peter M, MD  enalapril (VASOTEC) 2.5 MG tablet Take 1 tablet (2.5 mg total) by mouth 2 (two) times daily. 03/23/15   [place], Peter M, MD  omeprazole (PRILOSEC) 40 MG capsule Take 40 mg by mouth daily.    [provider]  ticagrelor (BRILINTA) 60 MG TABS tablet Take 1 tablet (60 mg total) by mouth 2 (two) times daily. 03/23/15   [place], Peter M, MD    Physical Exam    Vital Signs:  [facility], Dr. does not have vital signs available for review today.  Given telephonic nature of communication, physical exam is limited. AAOx3. NAD. Normal affect.  Speech and respirations are unlabored.  Assessment & Plan    Primary Cardiologist: Peter Swaziland, MD  Preoperative cardiovascular risk assessment.  Hernia repair by Dr. Derrell Lolling.  Chart reviewed as part of pre-operative protocol coverage. According to the RCRI, patient has a 6.6% risk of MACE. Patient reports activity equivalent to 4.0 METS (able to perform light to moderate activities around his home).   Given past medical history and time since last visit, based on ACC/AHA guidelines, Dejion J Brunetti, Dr. would be at acceptable risk for the planned procedure without further cardiovascular testing.   Patient was advised that if he develops new symptoms prior to surgery to contact our office to arrange a follow-up appointment.  he verbalized understanding.  Per office protocol and Dr. Swaziland, he may hold Brilinta for 5 days prior to procedure and should  resume as soon as hemodynamically stable postoperatively. Ideally aspirin should be continued without interruption, however if the bleeding risk is too great, aspirin may be held for 5-7 days prior to surgery. Please resume aspirin post operatively when it is felt to be safe from a bleeding standpoint.    I will route this recommendation to the requesting party via Epic fax function.  Please call with questions.  Time:   Today, I have spent 8 minutes with the patient with telehealth technology discussing medical history, symptoms, and management plan.     Carlos Levering, NP  05/04/2023, 7:45 AM

## 2023-05-23 ENCOUNTER — Encounter: Payer: Self-pay | Admitting: Family

## 2023-05-26 ENCOUNTER — Encounter: Payer: Self-pay | Admitting: Neurosurgery

## 2023-05-26 ENCOUNTER — Ambulatory Visit (INDEPENDENT_AMBULATORY_CARE_PROVIDER_SITE_OTHER): Admitting: Neurosurgery

## 2023-05-26 VITALS — BP 104/60 | Ht 68.0 in | Wt 191.0 lb

## 2023-05-26 DIAGNOSIS — M5416 Radiculopathy, lumbar region: Secondary | ICD-10-CM | POA: Diagnosis not present

## 2023-05-26 DIAGNOSIS — M4316 Spondylolisthesis, lumbar region: Secondary | ICD-10-CM | POA: Diagnosis not present

## 2023-05-26 NOTE — Progress Notes (Signed)
 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  His pain was on the front of his RLE and stopped at the knee.  He had some tingling.  He did a steroid taper in February, and has used some NSAIDs sparingly.  He was not able to walk any distance while the pain was bothering him.  He is now doing much better.  He walked 2 miles today without pain.  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  His pain is much improved.  He is going to start walking for exercise in the next week or so.   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  During this time, he is aggravated his back and right leg.  He has pain primarily around his right knee.  He also has pain around his right buttock at times.  He denies numbness or tingling.  This is worsened primarily by walking.  He has tried 3 rounds of prednisone.  The first round of prednisone made his symptoms go almost completely away, but his symptoms returned and he started additional prednisone.     12/26/2021 Mr. Jamorris Ndiaye is here today with a chief complaint of right leg, buttock, and lateral calf pain.  He began having pain around [place] weekend this year.  He was having significant right buttock pain with worse pain down his anterolateral calf particular when he walked or stood.  His pain resolved after a few weeks but has since come back on 2 different occasions, most recently this past weekend.  His pain can be as bad as 10 out of 10 requiring him to sit in a rolling chair to work.  He had an L2-3 disc herniation many years ago, but that  improved with conservative management.  He has taken both prednisone and Flexeril during this exacerbation with good response.   Bowel/Bladder Dysfunction: none  Conservative measures:  Physical therapy:  has not participated Multimodal medical therapy including regular antiinflammatories:  oxycodone, gabapentin, prednisone, skelaxin, cyclobenzaprine Injections:  has not received epidural steroid injections  Past Surgery:  denies  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  The symptoms are causing a significant impact on the patient's life.   Review of Systems:  A 10 point review of systems is negative, except for the pertinent positives and negatives detailed in the HPI.  Past Medical History: Past Medical History:  Diagnosis Date   Acute MI anterior wall subsequent episode care [facility])    2014   Anxiety    Borderline diabetes    CAD (coronary artery disease)    Dyslipidemia    Dysrhythmia 2014   V-FIB   GERD (gastroesophageal reflux disease)    Thyroid nodule     Past Surgical History: Past Surgical History:  Procedure Laterality Date   CARDIAC CATHETERIZATION     CORONARY ANGIOPLASTY WITH STENT PLACEMENT  2014   2 STENTS IN LAD   ESOPHAGOGASTRODUODENOSCOPY (EGD) WITH PROPOFOL N/A 12/19/2020   Procedure: ESOPHAGOGASTRODUODENOSCOPY (EGD) WITH PROPOFOL;  Surgeon: Earline Mayotte, MD;  Location: [facility] ENDOSCOPY;  Service: Endoscopy;  Laterality: N/A;  1ST CASE   KNEE ARTHROSCOPY Right 12/29/2014   Procedure: ARTHROSCOPY KNEE partial medial menisectomy;  Surgeon: [name], MD;  Location: [facility] ORS;  Service: Orthopedics;  Laterality: Right;   KNEE ARTHROSCOPY Left 05/09/2016   Procedure: ARTHROSCOPY KNEE, PARTIAL MEDIAL MENISECTOMY;  Surgeon: [name], MD;  Location: [facility] ORS;  Service: Orthopedics;  Laterality: Left;   LEFT HEART CATH AND CORONARY ANGIOGRAPHY N/A 07/30/2018   Procedure: LEFT HEART CATH AND CORONARY ANGIOGRAPHY;  Surgeon:  Kathleene Hazel, MD;  Location: [facility]IVE CV LAB;  Service: Cardiovascular;  Laterality: N/A;   LEFT HEART CATHETERIZATION WITH CORONARY ANGIOGRAM N/A 04/22/2012   Procedure: LEFT HEART CATHETERIZATION WITH CORONARY ANGIOGRAM;  Surgeon: [name], MD;  Location: [facility] CATH LAB;  Service: Cardiovascular;  Laterality: N/A;   LEFT HEART CATHETERIZATION WITH CORONARY ANGIOGRAM N/A 04/22/2013   Procedure: LEFT HEART CATHETERIZATION WITH CORONARY ANGIOGRAM;  Surgeon: [name], MD;  Location: [facility] CATH LAB;  Service: Cardiovascular;  Laterality: N/A;   MANDIBLE OSTEOTOMY      Allergies: Allergies as of 05/26/2023 - Review Complete 05/26/2023  Allergen Reaction Noted   Morphine and codeine Nausea And Vomiting 05/14/2012   Sulfa antibiotics Rash 04/22/2012    Medications: Current Meds  Medication Sig   aspirin EC 81 MG tablet Take 81 mg by mouth daily.   atorvastatin (LIPITOR) 10 MG tablet Take 1 tablet (10 mg total) by mouth daily.   carvedilol (COREG) 12.5 MG tablet Take 1 tablet (12.5 mg total) by mouth 2 (two) times daily.   enalapril (VASOTEC) 2.5 MG tablet Take 1 tablet (2.5 mg total) by mouth 2 (two) times daily.   omeprazole (PRILOSEC) 40 MG capsule Take 40 mg by mouth daily.   ticagrelor (BRILINTA) 60 MG TABS tablet Take 1 tablet (60 mg total) by mouth 2 (two) times daily.    Social History: Social History   Tobacco Use   Smoking status: Former    Types: Cigars    Quit date: 12/26/2012    Years since quitting: 10.4   Smokeless tobacco: Never  Vaping Use   Vaping status: Never Used  Substance Use Topics   Alcohol use: Yes    Alcohol/week: 7.0 standard drinks of alcohol    Types: 7 Glasses of wine per week    Comment: one glass of wine per day   Drug use: No    Family Medical History: Family History  Problem Relation Age of Onset   Hypertension Mother    Stroke Mother    COPD Father    Diabetes Father    Hypertension Father    Congestive Heart Failure  Father     Physical Examination: Vitals:   05/26/23 1342  BP: 104/60   5/5 BLE SILT   Medical Decision Making  Imaging: MRI L spine 08/15/21 IMPRESSION: Multilevel degenerative changes of the lumbar spine, worst at L2-L3, L3-L4, and L4-L5, as summarized below:   L1-L2: Mild right subarticular stenosis. No neural foraminal stenosis.   L2-L3: Mild retrolisthesis. Left greater than right subarticular stenosis and moderate left neural foraminal stenosis. Contact with the descending left L3 nerve root.   L3-L4: Trace retrolisthesis with disc bulging and biforaminal disc protrusions. Moderate to severe left-sided subarticular stenosis with potential for impingement of the descending left L4 nerve root. Moderate bilateral neural foraminal stenosis, left worse than right.   L4-L5: Grade 1 anterolisthesis. Moderate spinal canal stenosis and subarticular stenosis, left worse than right, potentially impinging the descending  left L5 nerve root. Moderate-severe left neural foraminal stenosis and contact with the exiting left L4 nerve root. Mild to moderate right neural foraminal stenosis.   L5-S1: Mild right neural foraminal stenosis primarily due to facet spurring.     Electronically Signed   By: Caprice Renshaw M.D.   On: 08/15/2021 09:59  I have personally reviewed the images and agree with the above interpretation.  Assessment and Plan: Mr. Marcott is a pleasant 73 y.o. male with improvement in his pain.  At this point, I would not recommend further intervention.  His symptoms were most consistent with L4 mediated symptoms, which could be secondary to worsening of the L4/5 anterolisthesis that was previously identified.    However, his symptoms have improved for the moment, so we will watch and wait to see how they behave.  If his symptoms worsen, will likely get MRI and consider PT or injections.   I spent a total of 15 minutes in this patient's care today. This time was  spent reviewing pertinent records including imaging studies, obtaining and confirming history, performing a directed evaluation, formulating and discussing my recommendations, and documenting the visit within the medical record.    The patient is aware of and accepts the limits of this telehealth visit.      Shavelle Runkel K. Myer Haff MD, [facility] Neurosurgery

## 2023-05-28 NOTE — Pre-Procedure Instructions (Signed)
 Surgical Instructions   Your procedure is scheduled on Monday, March 24th. Report to [facility] Main Entrance "A" at 12:30 P.M., then check in with the Admitting office. Any questions or running late day of surgery: call [phone]  Questions prior to your surgery date: call [phone], Monday-Friday, 8am-4pm. If you experience any cold or flu symptoms such as cough, fever, chills, shortness of breath, etc. between now and your scheduled surgery, please notify us at the above number.     Remember:  Do not eat after midnight the night before your surgery   You may drink clear liquids until 11:30 AM the morning of your surgery.   Clear liquids allowed are: Water, Non-Citrus Juices (without pulp), Carbonated Beverages, Clear Tea (no milk, honey, etc.), Black Coffee Only (NO MILK, CREAM OR POWDERED CREAMER of any kind), and Gatorade.    Take these medicines the morning of surgery with A SIP OF WATER  atorvastatin (LIPITOR)  carvedilol (COREG)    Stop Brilinta and Aspirin 5 days prior to surgery. Last dose 3/18.  One week prior to surgery, STOP taking any Aleve, Naproxen, Ibuprofen, Motrin, Advil, Goody's, BC's, all herbal medications, fish oil, and non-prescription vitamins.                     Do NOT Smoke (Tobacco/Vaping) for 24 hours prior to your procedure.  If you use a CPAP at night, you may bring your mask/headgear for your overnight stay.   You will be asked to remove any contacts, glasses, piercing's, hearing aid's, dentures/partials prior to surgery. Please bring cases for these items if needed.    Patients discharged the day of surgery will not be allowed to drive home, and someone needs to stay with them for 24 hours.  SURGICAL WAITING ROOM VISITATION Patients may have no more than 2 support people in the waiting area - these visitors may rotate.   Pre-op nurse will coordinate an appropriate time for 1 ADULT support person, who may not rotate, to accompany patient  in pre-op.  Children under the age of 3 must have an adult with them who is not the patient and must remain in the main waiting area with an adult.  If the patient needs to stay at the hospital during part of their recovery, the visitor guidelines for inpatient rooms apply.  Please refer to the [facility] website for the visitor guidelines for any additional information.   If you received a COVID test during your pre-op visit  it is requested that you wear a mask when out in public, stay away from anyone that may not be feeling well and notify your surgeon if you develop symptoms. If you have been in contact with anyone that has tested positive in the last 10 days please notify you surgeon.      Pre-operative CHG Bathing Instructions   You can play a key role in reducing the risk of infection after surgery. Your skin needs to be as free of germs as possible. You can reduce the number of germs on your skin by washing with CHG (chlorhexidine gluconate) soap before surgery. CHG is an antiseptic soap that kills germs and continues to kill germs even after washing.   DO NOT use if you have an allergy to chlorhexidine/CHG or antibacterial soaps. If your skin becomes reddened or irritated, stop using the CHG and notify one of our RNs at [phone].  TAKE A SHOWER THE NIGHT BEFORE SURGERY AND THE DAY OF SURGERY    Please keep in mind the following:  DO NOT shave, including legs and underarms, 48 hours prior to surgery.   You may shave your face before/day of surgery.  Place clean sheets on your bed the night before surgery Use a clean washcloth (not used since being washed) for each shower. DO NOT sleep with pet's night before surgery.  CHG Shower Instructions:  Wash your face and private area with normal soap. If you choose to wash your hair, wash first with your normal shampoo.  After you use shampoo/soap, rinse your hair and body thoroughly to remove shampoo/soap residue.   Turn the water OFF and apply half the bottle of CHG soap to a CLEAN washcloth.  Apply CHG soap ONLY FROM YOUR NECK DOWN TO YOUR TOES (washing for 3-5 minutes)  DO NOT use CHG soap on face, private areas, open wounds, or sores.  Pay special attention to the area where your surgery is being performed.  If you are having back surgery, having someone wash your back for you may be helpful. Wait 2 minutes after CHG soap is applied, then you may rinse off the CHG soap.  Pat dry with a clean towel  Put on clean pajamas    Additional instructions for the day of surgery: DO NOT APPLY any lotions, deodorants, cologne, or perfumes.   Do not wear jewelry or makeup Do not wear nail polish, gel polish, artificial nails, or any other type of covering on natural nails (fingers and toes) Do not bring valuables to the hospital. [facility] is not responsible for valuables/personal belongings. Put on clean/comfortable clothes.  Please brush your teeth.  Ask your nurse before applying any prescription medications to the skin.

## 2023-05-29 ENCOUNTER — Other Ambulatory Visit: Payer: Self-pay

## 2023-05-29 ENCOUNTER — Encounter (HOSPITAL_COMMUNITY)
Admission: RE | Admit: 2023-05-29 | Discharge: 2023-05-29 | Disposition: A | Discharge status: Home / Self Care | Source: Ambulatory Visit | Attending: General Surgery | Admitting: General Surgery

## 2023-05-29 ENCOUNTER — Encounter (HOSPITAL_COMMUNITY): Payer: Self-pay

## 2023-05-29 VITALS — BP 104/70 | HR 75 | Temp 98.3°F | Resp 17 | Ht 68.0 in | Wt 183.0 lb

## 2023-05-29 DIAGNOSIS — K4020 Bilateral inguinal hernia, without obstruction or gangrene, not specified as recurrent: Secondary | ICD-10-CM | POA: Insufficient documentation

## 2023-05-29 DIAGNOSIS — M549 Dorsalgia, unspecified: Secondary | ICD-10-CM | POA: Diagnosis not present

## 2023-05-29 DIAGNOSIS — Z955 Presence of coronary angioplasty implant and graft: Secondary | ICD-10-CM | POA: Diagnosis not present

## 2023-05-29 DIAGNOSIS — G8929 Other chronic pain: Secondary | ICD-10-CM | POA: Diagnosis not present

## 2023-05-29 DIAGNOSIS — I255 Ischemic cardiomyopathy: Secondary | ICD-10-CM | POA: Diagnosis not present

## 2023-05-29 DIAGNOSIS — I252 Old myocardial infarction: Secondary | ICD-10-CM | POA: Insufficient documentation

## 2023-05-29 DIAGNOSIS — F419 Anxiety disorder, unspecified: Secondary | ICD-10-CM | POA: Insufficient documentation

## 2023-05-29 DIAGNOSIS — Z8674 Personal history of sudden cardiac arrest: Secondary | ICD-10-CM | POA: Insufficient documentation

## 2023-05-29 DIAGNOSIS — R7303 Prediabetes: Secondary | ICD-10-CM | POA: Insufficient documentation

## 2023-05-29 DIAGNOSIS — Z01818 Encounter for other preprocedural examination: Secondary | ICD-10-CM

## 2023-05-29 DIAGNOSIS — Z87891 Personal history of nicotine dependence: Secondary | ICD-10-CM | POA: Insufficient documentation

## 2023-05-29 DIAGNOSIS — I2510 Atherosclerotic heart disease of native coronary artery without angina pectoris: Secondary | ICD-10-CM

## 2023-05-29 DIAGNOSIS — Z01812 Encounter for preprocedural laboratory examination: Secondary | ICD-10-CM | POA: Diagnosis not present

## 2023-05-29 DIAGNOSIS — K219 Gastro-esophageal reflux disease without esophagitis: Secondary | ICD-10-CM | POA: Diagnosis not present

## 2023-05-29 HISTORY — DX: Cardiac murmur, unspecified: R01.1

## 2023-05-29 HISTORY — DX: Prediabetes: R73.03

## 2023-05-29 HISTORY — DX: Sciatica, unspecified side: M54.30

## 2023-05-29 LAB — BASIC METABOLIC PANEL
Anion gap: 9 (ref 5–15)
BUN: 9 mg/dL (ref 8–23)
CO2: 22 mmol/L (ref 22–32)
Calcium: 9.5 mg/dL (ref 8.9–10.3)
Chloride: 107 mmol/L (ref 98–111)
Creatinine, Ser: 0.82 mg/dL (ref 0.61–1.24)
GFR, Estimated: >60 mL/min (ref >60)
Glucose, Bld: 111 mg/dL — ABNORMAL HIGH (ref 70–99)
Potassium: 4 mmol/L (ref 3.5–5.1)
Sodium: 138 mmol/L (ref 135–145)

## 2023-05-29 LAB — CBC
HCT: 43.8 % (ref 39.0–52.0)
Hemoglobin: 15.1 g/dL (ref 13.0–17.0)
MCH: 30.6 pg (ref 26.0–34.0)
MCHC: 34.5 g/dL (ref 30.0–36.0)
MCV: 88.8 fL (ref 80.0–100.0)
Platelets: 231 10*3/uL (ref 150–400)
RBC: 4.93 MIL/uL (ref 4.22–5.81)
RDW: 12.7 % (ref 11.5–15.5)
WBC: 5.8 10*3/uL (ref 4.0–10.5)
nRBC: 0 % (ref 0.0–0.2)

## 2023-05-29 NOTE — Pre-Procedure Instructions (Signed)
 Surgical Instructions   Your procedure is scheduled on Monday, March 24th. Report to [facility] Main Entrance "A" at 12:30 P.M., then check in with the Admitting office. Any questions or running late day of surgery: call [phone]  Questions prior to your surgery date: call [phone], Monday-Friday, 8am-4pm. If you experience any cold or flu symptoms such as cough, fever, chills, shortness of breath, etc. between now and your scheduled surgery, please notify us at the above number.     Remember:  Do not eat after midnight the night before your surgery   You may drink clear liquids until 11:30 AM the morning of your surgery.   Clear liquids allowed are: Water, Non-Citrus Juices (without pulp), Carbonated Beverages, Clear Tea (no milk, honey, etc.), Black Coffee Only (NO MILK, CREAM OR POWDERED CREAMER of any kind), and Gatorade.    Take these medicines the morning of surgery with A SIP OF WATER  atorvastatin (LIPITOR)  carvedilol (COREG)    *Please follow your surgeon's instructions on stopping Brilinta and Aspirin. If no instructions were given, please contact your surgeon's office.   One week prior to surgery, STOP taking any Aleve, Naproxen, Ibuprofen, Motrin, Advil, Goody's, BC's, all herbal medications, fish oil, and non-prescription vitamins.                     Do NOT Smoke (Tobacco/Vaping) for 24 hours prior to your procedure.  If you use a CPAP at night, you may bring your mask/headgear for your overnight stay.   You will be asked to remove any contacts, glasses, piercing's, hearing aid's, dentures/partials prior to surgery. Please bring cases for these items if needed.    Patients discharged the day of surgery will not be allowed to drive home, and someone needs to stay with them for 24 hours.  SURGICAL WAITING ROOM VISITATION Patients may have no more than 2 support people in the waiting area - these visitors may rotate.   Pre-op nurse will coordinate an  appropriate time for 1 ADULT support person, who may not rotate, to accompany patient in pre-op.  Children under the age of 83 must have an adult with them who is not the patient and must remain in the main waiting area with an adult.  If the patient needs to stay at the hospital during part of their recovery, the visitor guidelines for inpatient rooms apply.  Please refer to the [facility] website for the visitor guidelines for any additional information.   If you received a COVID test during your pre-op visit  it is requested that you wear a mask when out in public, stay away from anyone that may not be feeling well and notify your surgeon if you develop symptoms. If you have been in contact with anyone that has tested positive in the last 10 days please notify you surgeon.      Pre-operative CHG Bathing Instructions   You can play a key role in reducing the risk of infection after surgery. Your skin needs to be as free of germs as possible. You can reduce the number of germs on your skin by washing with CHG (chlorhexidine gluconate) soap before surgery. CHG is an antiseptic soap that kills germs and continues to kill germs even after washing.   DO NOT use if you have an allergy to chlorhexidine/CHG or antibacterial soaps. If your skin becomes reddened or irritated, stop using the CHG and notify one of our RNs at [phone].  TAKE A SHOWER THE NIGHT BEFORE SURGERY AND THE DAY OF SURGERY    Please keep in mind the following:  DO NOT shave, including legs and underarms, 48 hours prior to surgery.   You may shave your face before/day of surgery.  Place clean sheets on your bed the night before surgery Use a clean washcloth (not used since being washed) for each shower. DO NOT sleep with pet's night before surgery.  CHG Shower Instructions:  Wash your face and private area with normal soap. If you choose to wash your hair, wash first with your normal shampoo.  After you use  shampoo/soap, rinse your hair and body thoroughly to remove shampoo/soap residue.  Turn the water OFF and apply half the bottle of CHG soap to a CLEAN washcloth.  Apply CHG soap ONLY FROM YOUR NECK DOWN TO YOUR TOES (washing for 3-5 minutes)  DO NOT use CHG soap on face, private areas, open wounds, or sores.  Pay special attention to the area where your surgery is being performed.  If you are having back surgery, having someone wash your back for you may be helpful. Wait 2 minutes after CHG soap is applied, then you may rinse off the CHG soap.  Pat dry with a clean towel  Put on clean pajamas    Additional instructions for the day of surgery: DO NOT APPLY any lotions, deodorants, cologne, or perfumes.   Do not wear jewelry or makeup Do not wear nail polish, gel polish, artificial nails, or any other type of covering on natural nails (fingers and toes) Do not bring valuables to the hospital. [facility] is not responsible for valuables/personal belongings. Put on clean/comfortable clothes.  Please brush your teeth.  Ask your nurse before applying any prescription medications to the skin.

## 2023-05-29 NOTE — Telephone Encounter (Signed)
 The only echo I could find is from 05/04/17. I will fax this to requesting office. They may need to reach out to the pt and see if he had one done more recently and they can call and get results.

## 2023-05-29 NOTE — Progress Notes (Addendum)
 PCP - Dr. De Hollingshead (retiring)- pt state she has appointment at some point to see new MD, but has not seen them yet.  Cardiologist - Dr Peter Swaziland  PPM/ICD - N/A   Chest x-ray - N/A EKG - 06/26/22 Stress Test - 03/28/13 ECHO - pt reports doing an ECHO in his office yearly. Last done some time in 2024- Pt states that Dr. Swaziland should have a copy, and he can bring a copy that he has at home on the DOS.  Cardiac Cath - 07/30/2018  Sleep Study - denies   Fasting Blood Sugar - N/A- pt states he is pre-DM and is last A1c was 5.4 and done within the last couple of months.   Last dose of GLP1 agonist-  N/A  Blood Thinner Instructions: Pt states that cardiology told him to continue ASA and stop Brilinta 5-7 days prior to surgery. Pt instructed to contact Dr. Derrell Lolling office to confirm these instructions. Pt verbalized understanding.   ERAS Protcol - ERAS per protocol- no orders at PAT  COVID TEST- N/A   Anesthesia review: yes- heart history- pt reports hx stent in 2014 as well as having a heart murmur. Pt reports that he will bring a copy of his ECHO on the DOS. He states that he does not have fax, but may be able to scan in and email if needed. Pt denies any new cardiac symptoms and has clearance from 05/04/23.  I reached out to Dr. Elvis Coil office regarding if a copy of ECHO from 2024 was at the office and if it could be scanned into epic/faxed to PAT department. The office was unsure if copy was still available.   Patient denies shortness of breath, fever, cough and chest pain at PAT appointment  All instructions explained to the patient, with a verbal understanding of the material. Patient agrees to go over the instructions while at home for a better understanding.  The opportunity to ask questions was provided.

## 2023-05-29 NOTE — Telephone Encounter (Signed)
 Sharyl Nimrod, RN with pre service center for surgery called in stating she received clearance. Anesthesiology is wanting a copy of most recent echo mentioned in the note. 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 Please advise.   Fax number: [phone]

## 2023-06-01 ENCOUNTER — Encounter (HOSPITAL_COMMUNITY): Payer: Self-pay

## 2023-06-01 NOTE — Progress Notes (Signed)
 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 Last cath in 2020 showed patent stents with otherwise mild-mod nonobstructive CAD. He has hx of ischemic cardiomyopathy with now recovered EF. Last echo (at [facility]) in 2022 showed normal EF with mild AS. Last seen by Cardiology in clinic on 08/05/22 by Dr. Swaziland and noted to be stable. Pre op clearance visit on 05/04/23 and patient was noted to be doing well. Cleared for surgery:  "Chart reviewed as part of pre-operative protocol coverage. According to the RCRI, patient has a 6.6% risk of MACE. Patient reports activity equivalent to 4.0 METS (able to perform light to moderate activities around his home).  Given past medical history and time since last visit, based on ACC/AHA guidelines, Patrick Gordon, Dr. would be at acceptable risk for the planned procedure without further cardiovascular testing."  Patient had a fall and was diagnosed with traumatic SAH in 06/2022. His Brilinta was briefly held. He had a f/u CT head which showed it was resolved.   Brilinta: Hold 5-7 days   VS: BP 104/70   Pulse 75   Temp 36.8 C   Resp 17   Ht 5\' 8"  (1.727 m)   Wt 83 kg   SpO2 100%   BMI 27.83 kg/m   PROVIDERS: Patrick Downs, MD   LABS: Labs reviewed: Acceptable for surgery. (all labs ordered are listed, but only abnormal results are displayed)  Labs Reviewed  BASIC METABOLIC PANEL - Abnormal; Notable for the  following components:      Result Value   Glucose, Bld 111 (*)    All other components within normal limits  CBC     IMAGES: CT head 07/25/22:  IMPRESSION: No acute intracranial abnormality.    EKG:   CV:  Echo 11/27/2020:  Evidence of aortic stenosis with a peak gradient of 24 mmHg.  Calculated ejection fraction of 57%.  Annular calcifications of the aortic valve.    LHC 07/30/2018:  Prox RCA lesion is 40% stenosed. Mid RCA lesion is 20% stenosed. 2nd Mrg lesion is 20% stenosed. Prox LAD to Mid LAD lesion is 10% stenosed. The left ventricular systolic function is normal. LV end diastolic pressure is normal. The left ventricular ejection fraction is greater than 65% by visual estimate. There is no mitral valve regurgitation.   1. Patent stent proximal and mid LAD with minimal restenosis.  2. Mild non-obstructive disease in the Circumflex system 3. Moderate non-obstructive disease in the mid RCA. This does not appear to be flow limiting.  4. Preserved LV systolic function with apical hypokinesis   Recommendations: Medical management of CAD  Echo 05/04/2017:  Mild concentric LVH Evidence for left ventricular diastolic dysfunction Left atrium is borderline dilated Trace tricuspid regurgitation  Past Medical History:  Diagnosis Date   Acute MI anterior wall subsequent episode care [facility])    2014   Anxiety    Borderline diabetes    CAD (  coronary artery disease)    Dyslipidemia    Dysrhythmia 2014   V-FIB   GERD (gastroesophageal reflux disease)    Heart murmur    Pre-diabetes    Sciatica    Thyroid nodule    normal thyroid function per patient    Past Surgical History:  Procedure Laterality Date   CARDIAC CATHETERIZATION     CORONARY ANGIOPLASTY WITH STENT PLACEMENT  2014   2 STENTS IN LAD   ESOPHAGOGASTRODUODENOSCOPY (EGD) WITH PROPOFOL N/A 12/19/2020   Procedure: ESOPHAGOGASTRODUODENOSCOPY (EGD) WITH PROPOFOL;  Surgeon: Patrick Mayotte, MD;   Location: [facility] ENDOSCOPY;  Service: Endoscopy;  Laterality: N/A;  1ST CASE   KNEE ARTHROSCOPY Right 12/29/2014   Procedure: ARTHROSCOPY KNEE partial medial menisectomy;  Surgeon: Patrick Heinz, MD;  Location: [facility] ORS;  Service: Orthopedics;  Laterality: Right;   KNEE ARTHROSCOPY Left 05/09/2016   Procedure: ARTHROSCOPY KNEE, PARTIAL MEDIAL MENISECTOMY;  Surgeon: Patrick Heinz, MD;  Location: [facility] ORS;  Service: Orthopedics;  Laterality: Left;   LEFT HEART CATH AND CORONARY ANGIOGRAPHY N/A 07/30/2018   Procedure: LEFT HEART CATH AND CORONARY ANGIOGRAPHY;  Surgeon: Patrick Hazel, MD;  [facility]: [facility] INV[facility] CV LAB;  Service: Cardiovascular;  Laterality: N/A;   LEFT HEART CATHETERIZATION WITH CORONARY ANGIOGRAM N/A 04/22/2012   Procedure: LEFT HEART CATHETERIZATION WITH CORONARY ANGIOGRAM;  Surgeon: Patrick M Swaziland, MD;  Location: [facility] CATH LAB;  Service: Cardiovascular;  Laterality: N/A;   LEFT HEART CATHETERIZATION WITH CORONARY ANGIOGRAM N/A 04/22/2013   Procedure: LEFT HEART CATHETERIZATION WITH CORONARY ANGIOGRAM;  Surgeon: Patrick M Swaziland, MD;  Location: [facility] CATH LAB;  Service: Cardiovascular;  Laterality: N/A;   MANDIBLE OSTEOTOMY      MEDICATIONS:  aspirin EC 81 MG tablet   atorvastatin (LIPITOR) 10 MG tablet   carvedilol (COREG) 12.5 MG tablet   enalapril (VASOTEC) 2.5 MG tablet   omeprazole (PRILOSEC) 40 MG capsule   ticagrelor (BRILINTA) 60 MG TABS tablet   No current facility-administered medications for this encounter.   [name] [facility] Short Stay/Anesthesiology [facility] Phone [phone] 06/01/2023 1:18 PM

## 2023-06-01 NOTE — Anesthesia Preprocedure Evaluation (Addendum)
 Anesthesia Evaluation  Patient identified by MRN, date of birth, ID band Patient awake    Reviewed: Allergy & Precautions, H&P , NPO status , Patient's Chart, lab work & pertinent test results, reviewed documented beta blocker date and time   Airway Mallampati: II  TM Distance: >3 FB Neck ROM: Full    Dental  (+) Dental Advisory Given, Teeth Intact   Pulmonary former smoker   Pulmonary exam normal        Cardiovascular + CAD, + Past MI and + Cardiac Stents  Normal cardiovascular exam+ Valvular Problems/Murmurs AS   Hx of reduced EF ~30 % following MI with Vfib arrest. Now recovered per cardiology note from OSH. Able to achieve >30mets without symptoms  Mild AS   Neuro/Psych  PSYCHIATRIC DISORDERS Anxiety     Hx of subarachnoid hemorrhage CVA, No Residual Symptoms    GI/Hepatic Neg liver ROS,GERD  Medicated and Controlled,,  Endo/Other   Pre-DM, no meds   Renal/GU negative Renal ROS     Musculoskeletal negative musculoskeletal ROS (+)    Abdominal   Peds negative pediatric ROS (+)  Hematology  On brilinta    Anesthesia Other Findings   Reproductive/Obstetrics negative OB ROS                             Anesthesia Physical Anesthesia Plan  ASA: 3  Anesthesia Plan: General   Post-op Pain Management: Precedex   Induction: Intravenous  PONV Risk Score and Plan: 2 and Ondansetron, Dexamethasone and Treatment may vary due to age or medical condition  Airway Management Planned: Oral ETT  Additional Equipment: None  Intra-op Plan:   Post-operative Plan: Extubation in OR  Informed Consent: I have reviewed the patients History and Physical, chart, labs and discussed the procedure including the risks, benefits and alternatives for the proposed anesthesia with the patient or authorized representative who has indicated his/her understanding and acceptance.     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 Last cath in 2020 showed patent stents with otherwise mild-mod nonobstructive CAD. He has hx of ischemic cardiomyopathy with now recovered EF. Last echo (at [facility]) in 2022 showed normal EF with mild AS. Last seen by Cardiology in clinic on 08/05/22 by Dr. Swaziland and noted to be stable. Pre op clearance visit on 05/04/23 and patient was noted to be doing well. Cleared for surgery:   "Chart reviewed as part of pre-operative protocol coverage. According to the RCRI, patient has a 6.6% risk of MACE. Patient reports activity equivalent to 4.0 METS (able to perform light to moderate activities around his home).  Given past medical history and time since last visit, based on ACC/AHA guidelines, Patrick Gordon, Dr. would be at acceptable risk for the planned procedure without further cardiovascular testing."   Patient had a fall and was diagnosed with traumatic SAH in 06/2022. His Brilinta was briefly held. He had a f/u CT head which showed it was resolved. )        Anesthesia  Quick Evaluation

## 2023-06-08 ENCOUNTER — Ambulatory Visit (HOSPITAL_COMMUNITY): Payer: Self-pay | Admitting: Medical

## 2023-06-08 ENCOUNTER — Encounter (HOSPITAL_COMMUNITY): Payer: Self-pay | Admitting: General Surgery

## 2023-06-08 ENCOUNTER — Encounter (HOSPITAL_COMMUNITY)
Admission: RE | Disposition: A | Discharge status: Home / Self Care | Payer: Self-pay | Source: Ambulatory Visit | Attending: General Surgery

## 2023-06-08 ENCOUNTER — Ambulatory Visit (HOSPITAL_COMMUNITY)
Admission: RE | Admit: 2023-06-08 | Discharge: 2023-06-08 | Disposition: A | Discharge status: Home / Self Care | Payer: Medicare Other | Source: Ambulatory Visit | Attending: General Surgery | Admitting: General Surgery

## 2023-06-08 ENCOUNTER — Ambulatory Visit (HOSPITAL_BASED_OUTPATIENT_CLINIC_OR_DEPARTMENT_OTHER): Payer: Self-pay

## 2023-06-08 ENCOUNTER — Other Ambulatory Visit: Payer: Self-pay

## 2023-06-08 DIAGNOSIS — E785 Hyperlipidemia, unspecified: Secondary | ICD-10-CM

## 2023-06-08 DIAGNOSIS — Z87891 Personal history of nicotine dependence: Secondary | ICD-10-CM | POA: Diagnosis not present

## 2023-06-08 DIAGNOSIS — K4020 Bilateral inguinal hernia, without obstruction or gangrene, not specified as recurrent: Secondary | ICD-10-CM

## 2023-06-08 DIAGNOSIS — I2510 Atherosclerotic heart disease of native coronary artery without angina pectoris: Secondary | ICD-10-CM

## 2023-06-08 DIAGNOSIS — I639 Cerebral infarction, unspecified: Secondary | ICD-10-CM

## 2023-06-08 DIAGNOSIS — Z7982 Long term (current) use of aspirin: Secondary | ICD-10-CM | POA: Diagnosis not present

## 2023-06-08 DIAGNOSIS — I252 Old myocardial infarction: Secondary | ICD-10-CM | POA: Insufficient documentation

## 2023-06-08 DIAGNOSIS — K219 Gastro-esophageal reflux disease without esophagitis: Secondary | ICD-10-CM | POA: Diagnosis not present

## 2023-06-08 DIAGNOSIS — Z8674 Personal history of sudden cardiac arrest: Secondary | ICD-10-CM | POA: Diagnosis not present

## 2023-06-08 DIAGNOSIS — Z8673 Personal history of transient ischemic attack (TIA), and cerebral infarction without residual deficits: Secondary | ICD-10-CM | POA: Diagnosis not present

## 2023-06-08 DIAGNOSIS — Z7902 Long term (current) use of antithrombotics/antiplatelets: Secondary | ICD-10-CM | POA: Insufficient documentation

## 2023-06-08 DIAGNOSIS — R7303 Prediabetes: Secondary | ICD-10-CM | POA: Diagnosis not present

## 2023-06-08 DIAGNOSIS — Z955 Presence of coronary angioplasty implant and graft: Secondary | ICD-10-CM | POA: Insufficient documentation

## 2023-06-08 HISTORY — PX: INGUINAL HERNIA REPAIR: SHX194

## 2023-06-08 SURGERY — REPAIR, HERNIA, INGUINAL, BILATERAL, LAPAROSCOPIC
Anesthesia: General | Site: Inguinal | Laterality: Bilateral

## 2023-06-08 MED ORDER — OXYCODONE HCL 5 MG PO TABS
ORAL_TABLET | ORAL | Status: AC
  Filled 2023-06-08: qty 1

## 2023-06-08 MED ORDER — OXYCODONE HCL 5 MG PO TABS
5.0000 mg | ORAL_TABLET | Freq: Once | ORAL | Status: AC | PRN
  Administered 2023-06-08: 5 mg via ORAL

## 2023-06-08 MED ORDER — FENTANYL CITRATE (PF) 250 MCG/5ML IJ SOLN
INTRAMUSCULAR | Status: AC
  Filled 2023-06-08: qty 5

## 2023-06-08 MED ORDER — ROCURONIUM BROMIDE 10 MG/ML (PF) SYRINGE
PREFILLED_SYRINGE | INTRAVENOUS | Status: DC | PRN
  Administered 2023-06-08: 50 mg via INTRAVENOUS
  Administered 2023-06-08: 10 mg via INTRAVENOUS

## 2023-06-08 MED ORDER — DEXAMETHASONE SODIUM PHOSPHATE 10 MG/ML IJ SOLN
INTRAMUSCULAR | Status: AC
  Filled 2023-06-08: qty 1

## 2023-06-08 MED ORDER — DEXMEDETOMIDINE HCL IN NACL 80 MCG/20ML IV SOLN
INTRAVENOUS | Status: DC | PRN
  Administered 2023-06-08: 8 ug via INTRAVENOUS

## 2023-06-08 MED ORDER — ACETAMINOPHEN 10 MG/ML IV SOLN: 1000.0000 mg | Freq: Once | INTRAVENOUS | Status: DC | PRN

## 2023-06-08 MED ORDER — TRAMADOL HCL 50 MG PO TABS: 50.0000 mg | ORAL_TABLET | Freq: Four times a day (QID) | ORAL | 0 refills | Status: DC | PRN

## 2023-06-08 MED ORDER — FENTANYL CITRATE (PF) 100 MCG/2ML IJ SOLN
25.0000 ug | INTRAMUSCULAR | Status: DC | PRN
  Administered 2023-06-08 (×2): 25 ug via INTRAVENOUS

## 2023-06-08 MED ORDER — CEFAZOLIN SODIUM-DEXTROSE 2-4 GM/100ML-% IV SOLN
INTRAVENOUS | Status: AC
  Filled 2023-06-08: qty 100

## 2023-06-08 MED ORDER — DROPERIDOL 2.5 MG/ML IJ SOLN: 0.6250 mg | Freq: Once | INTRAMUSCULAR | Status: DC | PRN

## 2023-06-08 MED ORDER — DEXAMETHASONE SODIUM PHOSPHATE 10 MG/ML IJ SOLN
INTRAMUSCULAR | Status: DC | PRN
  Administered 2023-06-08: 10 mg via INTRAVENOUS

## 2023-06-08 MED ORDER — OXYCODONE HCL 5 MG/5ML PO SOLN: 5.0000 mg | Freq: Once | ORAL | Status: AC | PRN

## 2023-06-08 MED ORDER — ACETAMINOPHEN 500 MG PO TABS
1000.0000 mg | ORAL_TABLET | Freq: Once | ORAL | Status: AC
  Administered 2023-06-08: 1000 mg via ORAL
  Filled 2023-06-08: qty 2

## 2023-06-08 MED ORDER — ORAL CARE MOUTH RINSE: 15.0000 mL | Freq: Once | OROMUCOSAL | Status: AC

## 2023-06-08 MED ORDER — BUPIVACAINE HCL (PF) 0.25 % IJ SOLN
INTRAMUSCULAR | Status: AC
  Filled 2023-06-08: qty 30

## 2023-06-08 MED ORDER — PROPOFOL 10 MG/ML IV BOLUS
INTRAVENOUS | Status: DC | PRN
  Administered 2023-06-08: 50 mg via INTRAVENOUS
  Administered 2023-06-08: 130 mg via INTRAVENOUS

## 2023-06-08 MED ORDER — PROPOFOL 10 MG/ML IV BOLUS
INTRAVENOUS | Status: AC
  Filled 2023-06-08: qty 20

## 2023-06-08 MED ORDER — 0.9 % SODIUM CHLORIDE (POUR BTL) OPTIME
TOPICAL | Status: DC | PRN
Start: 2023-06-08 — End: 2023-06-08
  Administered 2023-06-08: 1000 mL

## 2023-06-08 MED ORDER — CHLORHEXIDINE GLUCONATE 0.12 % MT SOLN
15.0000 mL | Freq: Once | OROMUCOSAL | Status: AC
  Administered 2023-06-08: 15 mL via OROMUCOSAL
  Filled 2023-06-08: qty 15

## 2023-06-08 MED ORDER — LACTATED RINGERS IV SOLN: INTRAVENOUS | Status: DC

## 2023-06-08 MED ORDER — ONDANSETRON HCL 4 MG/2ML IJ SOLN
INTRAMUSCULAR | Status: DC | PRN
  Administered 2023-06-08: 4 mg via INTRAVENOUS

## 2023-06-08 MED ORDER — FENTANYL CITRATE (PF) 100 MCG/2ML IJ SOLN
INTRAMUSCULAR | Status: AC
  Filled 2023-06-08: qty 2

## 2023-06-08 MED ORDER — LIDOCAINE 2% (20 MG/ML) 5 ML SYRINGE
INTRAMUSCULAR | Status: DC | PRN
  Administered 2023-06-08: 60 mg via INTRAVENOUS

## 2023-06-08 MED ORDER — ONDANSETRON HCL 4 MG/2ML IJ SOLN
INTRAMUSCULAR | Status: AC
  Filled 2023-06-08: qty 2

## 2023-06-08 MED ORDER — FENTANYL CITRATE (PF) 250 MCG/5ML IJ SOLN
INTRAMUSCULAR | Status: DC | PRN
  Administered 2023-06-08: 50 ug via INTRAVENOUS
  Administered 2023-06-08 (×2): 100 ug via INTRAVENOUS

## 2023-06-08 MED ORDER — CEFAZOLIN SODIUM-DEXTROSE 2-3 GM-%(50ML) IV SOLR
INTRAVENOUS | Status: DC | PRN
  Administered 2023-06-08: 2 g via INTRAVENOUS

## 2023-06-08 MED ORDER — BUPIVACAINE HCL (PF) 0.25 % IJ SOLN
INTRAMUSCULAR | Status: DC | PRN
  Administered 2023-06-08: 7 mL

## 2023-06-08 MED ORDER — PHENYLEPHRINE 80 MCG/ML (10ML) SYRINGE FOR IV PUSH (FOR BLOOD PRESSURE SUPPORT)
PREFILLED_SYRINGE | INTRAVENOUS | Status: DC | PRN
  Administered 2023-06-08: 40 ug via INTRAVENOUS

## 2023-06-08 MED ORDER — SUGAMMADEX SODIUM 200 MG/2ML IV SOLN
INTRAVENOUS | Status: DC | PRN
Start: 2023-06-08 — End: 2023-06-08
  Administered 2023-06-08: 200 mg via INTRAVENOUS

## 2023-06-08 SURGICAL SUPPLY — 41 items
APPLIER CLIP LOGIC TI 5 (MISCELLANEOUS) IMPLANT
BAG COUNTER SPONGE SURGICOUNT (BAG) ×1 IMPLANT
CANISTER SUCT 3000ML PPV (MISCELLANEOUS) IMPLANT
CHLORAPREP W/TINT 26 (MISCELLANEOUS) ×1 IMPLANT
COVER SURGICAL LIGHT HANDLE (MISCELLANEOUS) ×1 IMPLANT
DERMABOND ADVANCED .7 DNX12 (GAUZE/BANDAGES/DRESSINGS) ×1 IMPLANT
DISSECTOR BLUNT TIP ENDO 5MM (MISCELLANEOUS) IMPLANT
DRAPE LAPAROSCOPIC ABDOMINAL (DRAPES) ×1 IMPLANT
ELECT REM PT RETURN 9FT ADLT (ELECTROSURGICAL) ×1 IMPLANT
ELECTRODE REM PT RTRN 9FT ADLT (ELECTROSURGICAL) ×1 IMPLANT
ENDOLOOP SUT PDS II 0 18 (SUTURE) IMPLANT
GLOVE BIO SURGEON STRL SZ7.5 (GLOVE) ×2 IMPLANT
GOWN STRL REUS W/ TWL LRG LVL3 (GOWN DISPOSABLE) ×2 IMPLANT
GOWN STRL REUS W/ TWL XL LVL3 (GOWN DISPOSABLE) ×1 IMPLANT
IRRIG SUCT STRYKERFLOW 2 WTIP (MISCELLANEOUS) IMPLANT
IRRIGATION SUCT STRKRFLW 2 WTP (MISCELLANEOUS) IMPLANT
KIT BASIN OR (CUSTOM PROCEDURE TRAY) ×1 IMPLANT
KIT TURNOVER KIT B (KITS) ×1 IMPLANT
MESH 3DMAX 5X7 LT XLRG (Mesh General) IMPLANT
MESH 3DMAX 5X7 RT XLRG (Mesh General) IMPLANT
NDL INSUFFLATION 14GA 120MM (NEEDLE) IMPLANT
NEEDLE INSUFFLATION 14GA 120MM (NEEDLE) ×1 IMPLANT
NS IRRIG 1000ML POUR BTL (IV SOLUTION) ×1 IMPLANT
PAD ARMBOARD POSITIONER FOAM (MISCELLANEOUS) ×2 IMPLANT
RELOAD STAPLE 4.0 BLU F/HERNIA (INSTRUMENTS) IMPLANT
RELOAD STAPLE 4.8 BLK F/HERNIA (STAPLE) IMPLANT
RELOAD STAPLE HERNIA 4.0 BLUE (INSTRUMENTS) IMPLANT
RELOAD STAPLE HERNIA 4.8 BLK (STAPLE) IMPLANT
SCISSORS LAP 5X35 DISP (ENDOMECHANICALS) ×1 IMPLANT
SET TUBE SMOKE EVAC HIGH FLOW (TUBING) ×1 IMPLANT
STAPLER HERNIA 12 8.5 360D (INSTRUMENTS) IMPLANT
SUT MNCRL AB 4-0 PS2 18 (SUTURE) ×1 IMPLANT
SUT VIC AB 1 CT1 27XBRD ANBCTR (SUTURE) IMPLANT
TOWEL GREEN STERILE (TOWEL DISPOSABLE) ×1 IMPLANT
TOWEL GREEN STERILE FF (TOWEL DISPOSABLE) ×1 IMPLANT
TRAY LAPAROSCOPIC MC (CUSTOM PROCEDURE TRAY) ×1 IMPLANT
TROCAR OPTICAL SHORT 5MM (TROCAR) ×1 IMPLANT
TROCAR OPTICAL SLV SHORT 5MM (TROCAR) ×1 IMPLANT
TROCAR Z THREAD OPTICAL 12X100 (TROCAR) ×1 IMPLANT
WARMER LAPAROSCOPE (MISCELLANEOUS) ×1 IMPLANT
WATER STERILE IRR 1000ML POUR (IV SOLUTION) ×1 IMPLANT

## 2023-06-08 NOTE — Anesthesia Procedure Notes (Signed)
 Procedure Name: Intubation Date/Time: 06/08/2023 2:29 PM  Performed by: Marcy Siren, RNPre-anesthesia Checklist: Patient identified, Emergency Drugs available, Suction available and Patient being monitored Patient Re-evaluated:Patient Re-evaluated prior to induction Oxygen Delivery Method: Circle System Utilized Preoxygenation: Pre-oxygenation with 100% oxygen Induction Type: IV induction Ventilation: Mask ventilation without difficulty and Oral airway inserted - appropriate to patient size Laryngoscope Size: Mac and 4 Grade View: Grade II Tube type: Oral Tube size: 7.5 mm Number of attempts: 1 Airway Equipment and Method: Stylet and Oral airway Placement Confirmation: ETT inserted through vocal cords under direct vision, positive ETCO2 and breath sounds checked- equal and bilateral Secured at: 22 cm Tube secured with: Tape Dental Injury: Teeth and Oropharynx as per pre-operative assessment

## 2023-06-08 NOTE — H&P (Signed)
 Chief Complaint:   Chief Complaint  Patient presents with  New Consultation   Subjective   HPI  Patrick Gordon is a 73 y.o. male who presents for New Consultation History of Present Illness The patient, with a history of heart disease and hiatal hernia, presents for evaluation of bilateral inguinal hernias. The right-sided hernia is larger and has been noticeable for approximately two to three years. The patient believes the hernias have grown in size over the past year, possibly related to significant weight loss. The hernias cause minimal discomfort, primarily noticeable when the patient needs to urinate or during sexual activity. The patient also reports a history of a ruptured disc, which has caused two significant episodes of pain in the past year.  PT see Dr. Swaziland his cardiologist  Review of Systems  Constitutional: Negative for chills and fever.  HENT: Negative for ear pain and sore throat.  Eyes: Negative for pain and visual disturbance.  Respiratory: Negative for cough and shortness of breath.  Cardiovascular: Negative for chest pain and palpitations.  Gastrointestinal: Negative for abdominal pain and vomiting.  Genitourinary: Negative for dysuria and hematuria.  Musculoskeletal: Negative for arthralgias and back pain.  Skin: Negative for color change and rash.  Neurological: Negative for seizures and syncope.  All other systems reviewed and are negative.  There is no problem list on file for this patient.  Outpatient Medications Prior to Visit  Medication Sig Dispense Refill  aspirin 81 MG EC tablet Take 81 mg by mouth once daily  atorvastatin (LIPITOR) 10 MG tablet  BRILINTA 60 mg tablet  carvediloL (COREG) 12.5 MG tablet  enalapril (VASOTEC) 2.5 MG tablet  omeprazole (PRILOSEC) 40 MG DR capsule   No facility-administered medications prior to visit.     Objective   Vitals:  04/14/23 1457 04/14/23 1458  BP: (!) 140/78  Pulse: 73  Temp: 36.7 C (98.1 F)  SpO2:  98%  Weight: 81.7 kg (180 lb 3.2 oz)  Height: 175.3 cm (5\' 9" )  PainSc: 0-No pain   Body mass index is 26.61 kg/m.  Home Vitals:   Physical Exam Physical Exam  PE:  Constitutional: No acute distress, conversant, appears states age. Eyes: Anicteric sclerae, moist conjunctiva, no lid lag Lungs: Clear to auscultation bilaterally, normal respiratory effort CV: regular rate and rhythm, no murmurs, no peripheral edema, pedal pulses 2+ GENITOURINARY: Bilateral hernias palpated, right side larger than left but both sides palpable. GI: Soft, no masses or hepatosplenomegaly, non-tender to palpation Skin: No rashes, palpation reveals normal turgor Psychiatric: appropriate judgment and insight, oriented to person, place, and time  Results LABS HbA1c: 5.3%  RADIOLOGY CT scan: Bilateral hernias, right side larger    Assessment/Plan:   Assessment & Plan Bilateral Inguinal Hernias  These hernias have been present for several years and have become more prominent following weight loss. There is no significant pain, but occasional discomfort occurs with urination and sexual activity. There is no history of abdominal surgeries except for a vasectomy. Plan for laparoscopic bilateral inguinal hernia repair with mesh placement. Contact cardiologist Dr. Swaziland for guidance on perioperative management of Brilinta and aspirin.  All risks and benefits were discussed with the patient, to generally include infection, bleeding, damage to surrounding structures, acute and chronic nerve pain, and recurrence. Alternatives were offered and described. All questions were answered and the patient voiced understanding of the procedure and wishes to proceed at this point.  Prediabetes  Control has improved with weight loss, and the recent HbA1c is 5.3. Continue current  management.  History of Cardiac Arrest  A cardiac arrest occurred in 2014, and Brilinta and aspirin are currently prescribed. Continue current  cardiac management under Dr. Swaziland.  Follow-up  Schedule the surgery date once clearance is obtained from the cardiologist.  Diagnoses and all orders for this visit:  Unilateral inguinal hernia without obstruction or gangrene, recurrence not specified

## 2023-06-08 NOTE — Anesthesia Postprocedure Evaluation (Signed)
 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  Last Vitals:  Vitals:   06/08/23 1545 06/08/23 1600  BP: (!) 147/93 (!) 148/81  Pulse: 80 81  Resp: 17 12  Temp:  36.6 C  SpO2: 96% 96%    Last Pain:  Vitals:   06/08/23 1240  TempSrc:   PainSc: 0-No pain                 Beryle Lathe

## 2023-06-08 NOTE — Op Note (Signed)
 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  PRE-OPERATIVE DIAGNOSIS:  BILATERAL INGUINAL HERNIA  POST-OPERATIVE DIAGNOSIS:  BILATERAL DIRECT INGUINAL HERNIA  PROCEDURE:  Procedure(s): LAPAROSCOPIC BILATERAL INGUINAL HERNIA REPAIR WITH MESH (Bilateral)  SURGEON:  Surgeons and Role:    Axel Filler, MD - Primary  ASSISTANTS: Jeronimo Greaves, RNFA   ANESTHESIA:   local and general  EBL:  minimal   BLOOD ADMINISTERED:none  DRAINS: none   LOCAL MEDICATIONS USED:  BUPIVICAINE   SPECIMEN:  No Specimen  DISPOSITION OF SPECIMEN:  N/A  COUNTS:  YES  TOURNIQUET:  * No tourniquets in log *  DICTATION: .Dragon Dictation  Counts: reported as correct x 2  Findings:  The patient had a bilateral large direct hernias  Indications for procedure:  The patient is a 73 year old male with bilateral hernias for several months. Patient complained of symptomatology to his inguinal areas. The patient was taken back for elective inguinal hernia repair.  Details of the procedure:   The patient was taken back to the operating room. The patient was placed in supine position with bilateral SCDs in place.  The patient was prepped and draped in the usual sterile fashion.  After appropriate anitbiotics were confirmed, a time-out was confirmed and all facts were verified.  0.25% Marcaine was used to infiltrate the umbilical area. A 11-blade was used to cut down the skin and blunt dissection was used to get the anterior fashion.  The anterior fascia was incised approximately 1 cm and the muscles were retracted laterally. Blunt dissection was then used to create a space in the preperitoneal area. At this time a 10 mm camera was then introduced into the space and advanced the pubic tubercle and a 12 mm trocar was placed over this and insufflation was started.  At this time and space was created from medial to laterally the preperitoneal space.  Cooper's ligament was initially  cleaned off.  The hernia was identified in the direct space. Dissection of the hernia sac was undertaken and it was fully reduced.  The transversalis fascia retracted spontaneously.    The spermatic cord and the vas deferens was identified and protected in all parts of the case. Cremasterics were dissected off from the cord.    The peritoneum was taken down to approximately the umbilicus.  A  I turned my attention to the left side.  At this time a space was created from medial to laterally the preperitoneal space.  Cooper's ligament was initially cleaned off.  The hernia was identified in the direct space. Dissection of the hernia sac was undertaken and it was fully reduced.  The transversalis fascia retracted spontaneously.    The spermatic cord and the vas deferens was identified and protected in all parts of the case. Cremasterics were dissected off from the cord.    Bilateral Bard 3D Max mesh, size: XLarge, was  introduced into the preperitoneal space.  The mesh was brought over to cover the direct and indirect hernia spaces.  This was anchored into place and secured to Cooper's ligament with 4.51mm staples from a Coviden hernia stapler. It was anchored to the anterior abdominal wall with 4.8 mm staples. The peritoneum was seen lying posterior to the mesh. This was done bilaterally.  There was no staples placed laterally. The insufflation was evacuated and the peritoneum was seen posterior to the mesh. The trochars were removed. The anterior fascia was reapproximated using #1 Vicryl on a UR- 6.  Intra-abdominal air was evacuated and the Veress needle removed. The skin was reapproximated using 4-0 Monocryl subcuticular fashion.  The skin was dressed with Dermabond.  The patient was awakened from general anesthesia and taken to recovery in stable condition.   PLAN OF CARE: Discharge to home after PACU  PATIENT DISPOSITION:  PACU - hemodynamically stable.   Delay start of Pharmacological VTE agent  (>24hrs) due to surgical blood loss or risk of bleeding: not applicable

## 2023-06-08 NOTE — Transfer of Care (Signed)
 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  Last Pain:  Vitals:   06/08/23 1240  TempSrc:   PainSc: 0-No pain      Patients Stated Pain Goal: 0 (06/08/23 1240)  Complications: No notable events documented.

## 2023-06-08 NOTE — Discharge Instructions (Signed)

## 2023-06-09 ENCOUNTER — Encounter (HOSPITAL_COMMUNITY): Payer: Self-pay | Admitting: General Surgery

## 2023-06-11 ENCOUNTER — Encounter (HOSPITAL_COMMUNITY): Payer: Self-pay | Admitting: General Surgery

## 2023-07-01 ENCOUNTER — Encounter: Payer: Self-pay | Admitting: Family

## 2023-07-01 ENCOUNTER — Ambulatory Visit: Payer: Medicare Other | Admitting: Family

## 2023-07-01 VITALS — BP 110/62 | HR 87 | Temp 98.6°F | Ht 68.0 in | Wt 181.0 lb

## 2023-07-01 DIAGNOSIS — E119 Type 2 diabetes mellitus without complications: Secondary | ICD-10-CM

## 2023-07-01 DIAGNOSIS — K8020 Calculus of gallbladder without cholecystitis without obstruction: Secondary | ICD-10-CM | POA: Diagnosis not present

## 2023-07-01 DIAGNOSIS — E042 Nontoxic multinodular goiter: Secondary | ICD-10-CM

## 2023-07-01 DIAGNOSIS — Z8679 Personal history of other diseases of the circulatory system: Secondary | ICD-10-CM | POA: Diagnosis not present

## 2023-07-01 DIAGNOSIS — M5416 Radiculopathy, lumbar region: Secondary | ICD-10-CM | POA: Diagnosis not present

## 2023-07-01 DIAGNOSIS — I2510 Atherosclerotic heart disease of native coronary artery without angina pectoris: Secondary | ICD-10-CM | POA: Diagnosis not present

## 2023-07-01 DIAGNOSIS — K2100 Gastro-esophageal reflux disease with esophagitis, without bleeding: Secondary | ICD-10-CM

## 2023-07-01 DIAGNOSIS — Z23 Encounter for immunization: Secondary | ICD-10-CM | POA: Diagnosis not present

## 2023-07-01 DIAGNOSIS — I252 Old myocardial infarction: Secondary | ICD-10-CM | POA: Insufficient documentation

## 2023-07-01 DIAGNOSIS — I220 Subsequent ST elevation (STEMI) myocardial infarction of anterior wall: Secondary | ICD-10-CM | POA: Insufficient documentation

## 2023-07-01 DIAGNOSIS — E785 Hyperlipidemia, unspecified: Secondary | ICD-10-CM

## 2023-07-01 DIAGNOSIS — M4316 Spondylolisthesis, lumbar region: Secondary | ICD-10-CM | POA: Insufficient documentation

## 2023-07-01 DIAGNOSIS — Z955 Presence of coronary angioplasty implant and graft: Secondary | ICD-10-CM | POA: Insufficient documentation

## 2023-07-01 LAB — MICROALBUMIN / CREATININE URINE RATIO
Creatinine,U: 182.8 mg/dL
Microalb Creat Ratio: 4.6 mg/g (ref 0.0–30.0)
Microalb, Ur: 0.8 mg/dL (ref 0.0–1.9)

## 2023-07-01 NOTE — Assessment & Plan Note (Signed)
 A1c well controlled with diet and exercise alone.

## 2023-07-01 NOTE — Assessment & Plan Note (Signed)
 Noted per pt record.  Pt states no longer requiring u/s repeats as with stability.

## 2023-07-01 NOTE — Assessment & Plan Note (Addendum)
 Followed with neurosurgery  Some improvement since steroids Pt declines physical therapy, will continue with physical rehab exercises at home

## 2023-07-01 NOTE — Assessment & Plan Note (Signed)
 Asymptomatic  Noted on prior imaging

## 2023-07-01 NOTE — Assessment & Plan Note (Signed)
 Followed by cardiology with two stents  Continue atorvastatin, carvedilol, asa 81 mg, brillinta 60 mg and enalapril  Continue low cholesterol diet

## 2023-07-01 NOTE — Assessment & Plan Note (Signed)
 At goal currently  Continue with atorvastatin 10 mg nightly

## 2023-07-01 NOTE — Assessment & Plan Note (Signed)
 Stable.  Continue omeprazole, gastritis on endoscopy

## 2023-07-01 NOTE — Assessment & Plan Note (Addendum)
 Stable.  Followed by neurosurgery prn

## 2023-07-01 NOTE — Progress Notes (Signed)
 New Patient Office Visit  Subjective:  Patient ID: Patrick Gordon, Dr., male    DOB: [birth year]  Age: 73 y.o. MRN: [id number]  CC:  Chief Complaint  Patient presents with   Establish Care    HPI [facility], Dr. is here to establish care as a new patient as well as here for annual exam.   Oriented to practice routines and expectations.  Prior provider was: Dr. Juel Burrow  Opthalmology completed 01/2023   Pt is without acute concerns.   Exercise, walks six miles every other days.   chronic concerns:  H/o heart CAD, STEMI associated with VIB, s/p PCI with stent x 2 to LAD feb 2014, ischemic cardiomyopathy. on atorvastatin, brillinta and asa, carvedilol, and enalapril CAD native coronary artery without angina Cardiac cath 2015 nonobstructive CAD with patent stents, EF 50-55% 2020 repeat cath cardiac cath again nonobstructive CAD patency of LAD stents EF 50-55% Zeio 2021, rare isolated PAC and PVC, carotid u/s mild nonobstructive disease right ICA  Per cardiology note, will repeat echo every 3 years.    HLD: on atorvastatin 10 mg nightly   GERD: taking omeprazole 40 mg once daily  Upper endoscopy   DM2: recent A1C 4.9 managed by diet and exercise, walks 6 miles every other day.   H/O subarachnoid hemorrhage after fall from a ladder, f/u with Dr. Myer Haff neurosurgery, f/u CT scan showed no evidence of intracranial hemorrhage. Advised to f/u prn  CT abd pelvis April 2019 with calcified stones gallbladder, mild splenomegaly, bil inguinal hernia    ROS: Negative unless specifically indicated above in HPI.   Current Outpatient Medications:    aspirin EC 81 MG tablet, Take 81 mg by mouth at bedtime., Disp: , Rfl:    atorvastatin (LIPITOR) 10 MG tablet, Take 1 tablet (10 mg total) by mouth daily., Disp: 90 tablet, Rfl: 3   carvedilol (COREG) 12.5 MG tablet, Take 1 tablet (12.5 mg total) by mouth 2 (two) times daily., Disp: 180 tablet, Rfl: 3   enalapril (VASOTEC) 2.5  MG tablet, Take 1 tablet (2.5 mg total) by mouth 2 (two) times daily., Disp: 180 tablet, Rfl: 3   omeprazole (PRILOSEC) 40 MG capsule, Take 40 mg by mouth at bedtime., Disp: , Rfl:    predniSONE (DELTASONE) 10 MG tablet, Take by mouth., Disp: , Rfl:    ticagrelor (BRILINTA) 60 MG TABS tablet, Take 1 tablet (60 mg total) by mouth 2 (two) times daily., Disp: 180 tablet, Rfl: 3 Past Medical History:  Diagnosis Date   Acute MI anterior wall subsequent episode care (HCC)    2014   Anxiety    Borderline diabetes    CAD (coronary artery disease)    Dyslipidemia    Dysrhythmia 2014   V-FIB   GERD (gastroesophageal reflux disease)    Heart murmur    Pre-diabetes    Sciatica    Thyroid nodule    normal thyroid function per patient   Past Surgical History:  Procedure Laterality Date   BIOPSY THYROID     pt reports negative   CARDIAC CATHETERIZATION     CORONARY ANGIOPLASTY WITH STENT PLACEMENT  2014   2 STENTS IN LAD   ESOPHAGOGASTRODUODENOSCOPY (EGD) WITH PROPOFOL N/A 12/19/2020   Procedure: ESOPHAGOGASTRODUODENOSCOPY (EGD) WITH PROPOFOL;  Surgeon: Earline Mayotte, MD;  Location: [facility] ENDOSCOPY;  Service: Endoscopy;  Laterality: N/A;  1ST CASE   INGUINAL HERNIA REPAIR Bilateral 06/08/2023   Procedure: LAPAROSCOPIC BILATERAL INGUINAL HERNIA REPAIR WITH MESH;  Surgeon: Derrell Lolling,  Jed Limerick, MD;  Location: [facility] OR;  Service: General;  Laterality: Bilateral;   KNEE ARTHROSCOPY Right 12/29/2014   Procedure: ARTHROSCOPY KNEE partial medial menisectomy;  Surgeon: [name], MD;  Location: [facility] ORS;  Service: Orthopedics;  Laterality: Right;   KNEE ARTHROSCOPY Left 05/09/2016   Procedure: ARTHROSCOPY KNEE, PARTIAL MEDIAL MENISECTOMY;  Surgeon: [name], MD;  Location: [facility] ORS;  Service: Orthopedics;  Laterality: Left;   LEFT HEART CATH AND CORONARY ANGIOGRAPHY N/A 07/30/2018   Procedure: LEFT HEART CATH AND CORONARY ANGIOGRAPHY;  Surgeon: Kathleene Hazel, MD;  [facility]  INVASIVE CV LAB;  Service: Cardiovascular;  Laterality: N/A;   LEFT HEART CATHETERIZATION WITH CORONARY ANGIOGRAM N/A 04/22/2012   Procedure: LEFT HEART CATHETERIZATION WITH CORONARY ANGIOGRAM;  Surgeon: [name], MD;  Location: [facility] CATH LAB;  Service: Cardiovascular;  Laterality: N/A;   LEFT HEART CATHETERIZATION WITH CORONARY ANGIOGRAM N/A 04/22/2013   Procedure: LEFT HEART CATHETERIZATION WITH CORONARY ANGIOGRAM;  Surgeon: [name], MD;  Location: [facility] CATH LAB;  Service: Cardiovascular;  Laterality: N/A;   MANDIBLE OSTEOTOMY     VASECTOMY      Objective:   Today's Vitals: BP 110/62   Pulse 87   Temp 98.6 F (37 C) (Oral)   Ht 5\' 8"  (1.727 m)   Wt 181 lb (82.1 kg)   SpO2 98%   BMI 27.52 kg/m   Physical Exam Constitutional:      General: He is not in acute distress.    Appearance: Normal appearance. He is normal weight. He is not ill-appearing, toxic-appearing or diaphoretic.  Cardiovascular:     Rate and Rhythm: Normal rate and regular rhythm.  Pulmonary:     Effort: Pulmonary effort is normal.  Abdominal:     General: Abdomen is flat.     Tenderness: There is no abdominal tenderness.     Comments: Healed laparoscopic incisions  Musculoskeletal:        General: Normal range of motion.  Neurological:     General: No focal deficit present.     Mental Status: He is alert and oriented to person, place, and time. Mental status is at baseline.  Psychiatric:        Mood and Affect: Mood normal.        Behavior: Behavior normal.        Thought Content: Thought content normal.        Judgment: Judgment normal.     Assessment & Plan:  Coronary artery disease involving native coronary artery of native heart without angina pectoris  Lumbar radiculopathy Assessment & Plan: Followed with neurosurgery  Some improvement since steroids Pt declines physical therapy, will continue with physical rehab exercises at home   Spondylolisthesis, lumbar region  Multinodular  thyroid Assessment & Plan: Noted per pt record.  Pt states no longer requiring u/s repeats as with stability.   History of placement of stent in LAD coronary artery  History of acute anterior wall MI Assessment & Plan: Followed by cardiology with two stents  Continue atorvastatin, carvedilol, asa 81 mg, brillinta 60 mg and enalapril  Continue low cholesterol diet    Subsequent ST elevation (STEMI) myocardial infarction of anterior wall (HCC)  History of subarachnoid hemorrhage Assessment & Plan: Stable.  Followed by neurosurgery prn    Gall stones Assessment & Plan: Asymptomatic  Noted on prior imaging   Diabetes mellitus without complication (HCC) Assessment & Plan: A1c well controlled with diet and exercise alone.    Orders: -  Microalbumin / creatinine urine ratio  Need for pneumococcal 20-valent conjugate vaccination -     Pneumococcal conjugate vaccine 20-valent  Dyslipidemia Assessment & Plan: At goal currently  Continue with atorvastatin 10 mg nightly    Gastroesophageal reflux disease with esophagitis without hemorrhage Assessment & Plan: Stable.  Continue omeprazole, gastritis on endoscopy      Follow-up: Return in about 1 year (around 06/30/2024) for f/u CPE.   Felicita Horns, FNP

## 2023-07-09 NOTE — Progress Notes (Signed)
 noted

## 2023-07-10 NOTE — Progress Notes (Signed)
 noted

## 2023-07-14 ENCOUNTER — Encounter: Payer: Self-pay | Admitting: Family

## 2023-07-14 NOTE — Progress Notes (Signed)
 noted

## 2023-07-24 NOTE — Progress Notes (Signed)
 noted

## 2023-08-06 ENCOUNTER — Ambulatory Visit (INDEPENDENT_AMBULATORY_CARE_PROVIDER_SITE_OTHER)

## 2023-08-06 VITALS — BP 110/62 | Ht 68.0 in | Wt 172.0 lb

## 2023-08-06 DIAGNOSIS — Z0000 Encounter for general adult medical examination without abnormal findings: Secondary | ICD-10-CM

## 2023-08-06 DIAGNOSIS — Z2821 Immunization not carried out because of patient refusal: Secondary | ICD-10-CM

## 2023-08-06 NOTE — Patient Instructions (Signed)
 [name] , Thank you for taking time out of your busy schedule to complete your Annual Wellness Visit with me. I enjoyed our conversation and look forward to speaking with you again next year. I, as well as your care team,  appreciate your ongoing commitment to your health goals. Please review the following plan we discussed and let me know if I can assist you in the future. Your Game plan/ To Do List    Referrals: If you haven't heard from the office you've been referred to, please reach out to them at the phone provided.   Follow up Visits: Next Medicare AWV with our clinical staff: 08/09/2024   Have you seen your provider in the last 6 months (3 months if uncontrolled diabetes)? Yes Next Office Visit with your provider: n/A  Clinician Recommendations:  Aim for 30 minutes of exercise or brisk walking, 6-8 glasses of water, and 5 servings of fruits and vegetables each day.       This is a list of the screening recommended for you and due dates:  Health Maintenance  Topic Date Due   Complete foot exam   Never done   DTaP/Tdap/Td vaccine (1 - Tdap) Never done   Zoster (Shingles) Vaccine (1 of 2) Never done   Eye exam for diabetics  10/31/2022   COVID-19 Vaccine (7 - 2024-25 season) 11/16/2022   Hepatitis C Screening  06/30/2024*   Hemoglobin A1C  09/24/2023   Flu Shot  10/16/2023   Yearly kidney function blood test for diabetes  05/28/2024   Yearly kidney health urinalysis for diabetes  06/30/2024   Medicare Annual Wellness Visit  08/05/2024   Colon Cancer Screening  12/20/2030   Pneumonia Vaccine  Completed   HPV Vaccine  Aged Out   Meningitis B Vaccine  Aged Out  *Topic was postponed. The date shown is not the original due date.    Advanced directives: (Declined) Advance directive discussed with you today. Even though you declined this today, please call our office should you change your mind, and we can give you the proper paperwork for you to fill out. Advance Care Planning  is important because it:  [x]  Makes sure you receive the medical care that is consistent with your values, goals, and preferences  [x]  It provides guidance to your family and loved ones and reduces their decisional burden about whether or not they are making the right decisions based on your wishes.  Follow the link provided in your after visit summary or read over the paperwork we have mailed to you to help you started getting your Advance Directives in place. If you need assistance in completing these, please reach out to us  so that we can help you!  See attachments for Preventive Care and Fall Prevention Tips.

## 2023-08-06 NOTE — Progress Notes (Signed)
 Because this visit was a virtual/telehealth visit,  certain criteria was not obtained, such a blood pressure, CBG if applicable, and timed get up and go. Any medications not marked as "taking" were not mentioned during the medication reconciliation part of the visit. Any vitals not documented were not able to be obtained due to this being a telehealth visit or patient was unable to self-report a recent blood pressure reading due to a lack of equipment at home via telehealth. Vitals that have been documented are verbally provided by the patient.   This visit was performed by a medical professional under my direct supervision. I was immediately available for consultation/collaboration. I have reviewed and agree with the Annual Wellness Visit documentation.  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  As a reminder, Annual Wellness Visits don't include a physical exam, and some assessments may be limited, especially if this visit is performed virtually. We may recommend an in-person follow-up visit with your provider if needed.  Visit Complete: Virtual I connected with  Patrick Gordon, Dr. on 08/06/23 by a audio enabled telemedicine application and verified that I am speaking with the correct person using two identifiers.  Patient Location: Home  Provider Location: Home Office  I discussed the limitations of evaluation and management by telemedicine. The patient expressed understanding and agreed to proceed.  Vital Signs: Because this visit was a virtual/telehealth visit, some criteria may be missing or patient reported. Any vitals not documented were not able to be obtained and vitals that have been documented are patient reported.  VideoDeclined- This patient declined Librarian, academic. Therefore the visit was completed with audio only.  Persons Participating in Visit: Patient.  AWV Questionnaire: No:  Patient Medicare AWV questionnaire was not completed prior to this visit.  Cardiac Risk Factors include: advanced age (>68men, >41 women);male gender;diabetes mellitus;dyslipidemia     Objective:     Today's Vitals   08/06/23 1317  BP: 110/62  Weight: 172 lb (78 kg)  Height: 5\' 8"  (1.727 m)   Body mass index is 26.15 kg/m.     08/06/2023    1:22 PM 06/08/2023   12:36 PM 05/29/2023    8:47 AM 06/26/2022    5:44 PM 08/15/2021    7:21 AM 12/19/2020    8:08 AM 07/30/2018    7:45 AM  Advanced Directives  Does Patient Have a Medical Advance Directive? Yes Yes Yes No No Yes Yes  Type of Estate agent of [place];Living will Healthcare Power of [place];Living will Healthcare Power of [place];Living will    Healthcare Power of [place];Living will  Does patient want to make changes to medical advance directive? No - Patient declined No - Patient declined       Copy of Healthcare Power of Attorney in Chart? No - copy requested No - copy requested No - copy requested      Would patient like information on creating a medical advance directive?    No - Patient declined No - Patient declined      Current Medications (verified) Outpatient Encounter Medications as of 08/06/2023  Medication Sig   aspirin  EC 81 MG tablet Take 81 mg by mouth at bedtime.   atorvastatin  (LIPITOR ) 10 MG tablet Take 1 tablet (10 mg total) by mouth daily.   carvedilol  (COREG ) 12.5 MG tablet Take 1 tablet (12.5 mg total) by mouth 2 (two) times daily.   enalapril  (VASOTEC )  2.5 MG tablet Take 1 tablet (2.5 mg total) by mouth 2 (two) times daily.   omeprazole (PRILOSEC) 40 MG capsule Take 40 mg by mouth at bedtime.   ticagrelor  (BRILINTA ) 60 MG TABS tablet Take 1 tablet (60 mg total) by mouth 2 (two) times daily.   predniSONE (DELTASONE) 10 MG tablet Take by mouth. (Patient not taking: Reported on 08/06/2023)   No facility-administered encounter medications on file as of 08/06/2023.    Allergies  (verified) Morphine  and codeine and Sulfa antibiotics   History: Past Medical History:  Diagnosis Date   Acute MI anterior wall subsequent episode care (HCC)    2014   Anxiety    Borderline diabetes    CAD (coronary artery disease)    Dyslipidemia    Dysrhythmia 2014   V-FIB   GERD (gastroesophageal reflux disease)    Heart murmur    Pre-diabetes    Sciatica    Thyroid  nodule    normal thyroid  function per patient   Past Surgical History:  Procedure Laterality Date   BIOPSY THYROID      pt reports negative   CARDIAC CATHETERIZATION     CORONARY ANGIOPLASTY WITH STENT PLACEMENT  2014   2 STENTS IN LAD   ESOPHAGOGASTRODUODENOSCOPY (EGD) WITH PROPOFOL  N/A 12/19/2020   Procedure: ESOPHAGOGASTRODUODENOSCOPY (EGD) WITH PROPOFOL ;  Surgeon: Marshall Skeeter, MD;  Location: [facility] ENDOSCOPY;  Service: Endoscopy;  Laterality: N/A;  1ST CASE   INGUINAL HERNIA REPAIR Bilateral 06/08/2023   Procedure: LAPAROSCOPIC BILATERAL INGUINAL HERNIA REPAIR WITH MESH;  Surgeon: Shela Derby, MD;  Location: [facility] OR;  Service: General;  Laterality: Bilateral;   KNEE ARTHROSCOPY Right 12/29/2014   Procedure: ARTHROSCOPY KNEE partial medial menisectomy;  Surgeon: Arlyne Lame, MD;  Location: [facility] ORS;  Service: Orthopedics;  Laterality: Right;   KNEE ARTHROSCOPY Left 05/09/2016   Procedure: ARTHROSCOPY KNEE, PARTIAL MEDIAL MENISECTOMY;  Surgeon: Arlyne Lame, MD;  Location: [facility] ORS;  Service: Orthopedics;  Laterality: Left;   LEFT HEART CATH AND CORONARY ANGIOGRAPHY N/A 07/30/2018   Procedure: LEFT HEART CATH AND CORONARY ANGIOGRAPHY;  Surgeon: Odie Benne, MD;  [facility]: [facility] INVASIVE CV LAB;  Service: Cardiovascular;  Laterality: N/A;   LEFT HEART CATHETERIZATION WITH CORONARY ANGIOGRAM N/A 04/22/2012   Procedure: LEFT HEART CATHETERIZATION WITH CORONARY ANGIOGRAM;  Surgeon: [name], MD;  Location: [facility] CATH LAB;  Service: Cardiovascular;  Laterality: N/A;   LEFT HEART  CATHETERIZATION WITH CORONARY ANGIOGRAM N/A 04/22/2013   Procedure: LEFT HEART CATHETERIZATION WITH CORONARY ANGIOGRAM;  Surgeon: [name], MD;  Location: [facility] CATH LAB;  Service: Cardiovascular;  Laterality: N/A;   MANDIBLE OSTEOTOMY     VASECTOMY     Family History  Problem Relation Age of Onset   Hypertension Mother    Stroke Mother    Thyroid  disease Mother        thyroidectomy   COPD Father    Diabetes Father    Hypertension Father    Congestive Heart Failure Father    Social History   Socioeconomic History   Marital status: Married    Spouse name: Not on file   Number of children: Not on file   Years of education: Not on file   Highest education level: Not on file  Occupational History   Occupation: MD    Comment: [employer]  Tobacco Use   Smoking status: Former    Types: Cigars    Quit date: 12/26/2012    Years since quitting: 10.6  Smokeless tobacco: Never  Vaping Use   Vaping status: Never Used  Substance and Sexual Activity   Alcohol use: Not Currently    Comment: rarely   Drug use: No   Sexual activity: Not on file  Other Topics Concern   Not on file  Social History Narrative   Not on file   Social Drivers of Health   Financial Resource Strain: Low Risk  (08/06/2023)   Overall Financial Resource Strain (CARDIA)    Difficulty of Paying Living Expenses: Not hard at all  Food Insecurity: Food Insecurity Present (08/06/2023)   Hunger Vital Sign    Worried About Running Out of Food in the Last Year: Sometimes true    Ran Out of Food in the Last Year: Sometimes true  Transportation Needs: No Transportation Needs (08/06/2023)   PRAPARE - Administrator, Civil Service (Medical): No    Lack of Transportation (Non-Medical): No  Physical Activity: Sufficiently Active (08/06/2023)   Exercise Vital Sign    Days of Exercise per Week: 5 days    Minutes of Exercise per Session: 100 min  Stress: Patient Declined (08/06/2023)   [employer] of [employer] - Occupational Stress Questionnaire    Feeling of Stress : Patient declined  Social Connections: Socially Isolated (08/06/2023)   Social Connection and Isolation Panel [NHANES]    Frequency of Communication with Friends and Family: More than three times a week    Frequency of Social Gatherings with Friends and Family: Three times a week    Attends Religious Services: Never    Active Member of Clubs or Organizations: No    Attends Banker Meetings: Never    Marital Status: Divorced    Tobacco Counseling Counseling given: Not Answered    Clinical Intake:  Pre-visit preparation completed: Yes  Pain : No/denies pain     BMI - recorded: 26.15 Nutritional Status: BMI 25 -29 Overweight Nutritional Risks: None Diabetes: No  Lab Results  Component Value Date   HGBA1C 6.1 (H) 04/22/2012     How often do you need to have someone help you when you read instructions, pamphlets, or other written materials from your doctor or pharmacy?: 1 - Never  Interpreter Needed?: No  Information entered by :: [email or web address]   Activities of Daily Living     08/06/2023    1:21 PM 05/29/2023    8:51 AM  In your present state of health, do you have any difficulty performing the following activities:  Hearing? 0   Vision? 0   Difficulty concentrating or making decisions? 0   Walking or climbing stairs? 0   Dressing or bathing? 0   Doing errands, shopping? 0 0  Preparing Food and eating ? N   Using the Toilet? N   In the past six months, have you accidently leaked urine? N   Do you have problems with loss of bowel control? N   Managing your Medications? N   Managing your Finances? N   Housekeeping or managing your Housekeeping? N     Patient Care Team: Felicita Horns, FNP as PCP - General (Family Medicine) [place], [facility]) [place], Peter M, MD as Consulting Physician (Cardiology)  Indicate any recent Medical Services  you may have received from other than [facility] providers in the past year (date may be approximate).     Assessment:    This is a routine wellness examination for Patrick Gordon.  Hearing/Vision screen Hearing Screening - Comments:: Patient  has no hearing difficulties  Vision Screening - Comments:: Patient wears otc reading glasses    Goals Addressed             This Visit's Progress    Patient Stated       To stay healthy        Depression Screen     08/06/2023    1:23 PM 05/19/2012    4:39 PM  PHQ 2/9 Scores  PHQ - 2 Score 0 1  PHQ- 9 Score 0     Fall Risk     08/06/2023    1:22 PM  Fall Risk   Falls in the past year? 0  Number falls in past yr: 0  Injury with Fall? 0  Risk for fall due to : No Fall Risks  Follow up Falls evaluation completed    MEDICARE RISK AT HOME:  Medicare Risk at Home Any stairs in or around the home?: Yes If so, are there any without handrails?: No Home free of loose throw rugs in walkways, pet beds, electrical cords, etc?: Yes Adequate lighting in your home to reduce risk of falls?: Yes Life alert?: No Use of a cane, walker or w/c?: No Grab bars in the bathroom?: Yes Shower chair or bench in shower?: Yes Elevated toilet seat or a handicapped toilet?: Yes  TIMED UP AND GO:  Was the test performed?  No  Cognitive Function: 6CIT completed        08/06/2023    1:20 PM  6CIT Screen  What Year? 0 points  What month? 0 points  What time? 0 points  Count back from 20 0 points  Months in reverse 0 points  Repeat phrase 0 points  Total Score 0 points    Immunizations Immunization History  Administered Date(s) Administered   Moderna Covid-19 Vaccine Bivalent Booster 87yrs & up 02/15/2021   Moderna Sars-Covid-2 Vaccination 06/21/2020   PFIZER(Purple Top)SARS-COV-2 Vaccination 03/22/2019, 04/12/2019, 12/16/2019   PNEUMOCOCCAL CONJUGATE-20 07/01/2023   Pfizer(Comirnaty)Fall Seasonal Vaccine 12 years and older 12/27/2021   Respiratory  Syncytial Virus Vaccine,Recomb Aduvanted(Arexvy) 12/27/2021    Screening Tests Health Maintenance  Topic Date Due   FOOT EXAM  Never done   DTaP/Tdap/Td (1 - Tdap) Never done   Zoster Vaccines- Shingrix (1 of 2) Never done   OPHTHALMOLOGY EXAM  10/31/2022   COVID-19 Vaccine (7 - 2024-25 season) 11/16/2022   Hepatitis C Screening  06/30/2024 (Originally 02/10/1969)   HEMOGLOBIN A1C  09/24/2023   INFLUENZA VACCINE  10/16/2023   Diabetic kidney evaluation - eGFR measurement  05/28/2024   Diabetic kidney evaluation - Urine ACR  06/30/2024   Medicare Annual Wellness (AWV)  08/05/2024   Colonoscopy  12/20/2030   Pneumonia Vaccine 66+ Years old  Completed   HPV VACCINES  Aged Out   Meningococcal B Vaccine  Aged Out    Health Maintenance  Health Maintenance Due  Topic Date Due   FOOT EXAM  Never done   DTaP/Tdap/Td (1 - Tdap) Never done   Zoster Vaccines- Shingrix (1 of 2) Never done   OPHTHALMOLOGY EXAM  10/31/2022   COVID-19 Vaccine (7 - 2024-25 season) 11/16/2022   Health Maintenance Items Addressed:patient declined health maintenance items   Additional Screening:  Vision Screening: Recommended annual ophthalmology exams for early detection of glaucoma and other disorders of the eye.  Dental Screening: Recommended annual dental exams for proper oral hygiene  Community Resource Referral / Chronic Care Management: CRR required this visit?  No  CCM required this visit?  No   Plan:    I have personally reviewed and noted the following in the patient's chart:   Medical and social history Use of alcohol, tobacco or illicit drugs  Current medications and supplements including opioid prescriptions. Patient is not currently taking opioid prescriptions. Functional ability and status Nutritional status Physical activity Advanced directives List of other physicians Hospitalizations, surgeries, and ER visits in previous 12 months Vitals Screenings to include cognitive,  depression, and falls Referrals and appointments  In addition, I have reviewed and discussed with patient certain preventive protocols, quality metrics, and best practice recommendations. A written personalized care plan for preventive services as well as general preventive health recommendations were provided to patient.   Freeda Jerry, [place]   08/06/2023   After Visit Summary: (MyChart) Due to this being a telephonic visit, the after visit summary with patients personalized plan was offered to patient via MyChart   Notes: Nothing significant to report at this time.

## 2023-09-11 ENCOUNTER — Ambulatory Visit: Admitting: Cardiology

## 2023-09-22 NOTE — H&P (View-Only) (Signed)
 Patrick Gordon, Dr. 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 He is s/p anterior STEMI associated with Vfib arrest on 04/22/2012. He underwent emergent stenting of the proximal to mid LAD with 2 long drug-eluting stents. Ejection fraction at cath was 25-30%. Echocardiogram demonstrated an ejection fraction 35%.Subsequent evaluation in [place] showed an EF 51% with small apical defect. He was concerned enough about his cardiac status that he underwent repeat cardiac cath on Feb. 6, 2015. This showed nonobstructive CAD with patent stents. EF was 50-55% with apical wall motion abnormality.   In May 2020 he complained of symptoms of dyspnea on exertion and some chest pressure. This led to a cardiac cath showing nonobstructive CAD and good patency of LAD stents. Good LV function with EF 50-55%. EDP was normal.  He wore a Zio patch monitor in Sept 2021 and this showed rare isolated PACs and PVCs with one PVC couplet. Carotid dopplers showed mild nonobstructive disease on the right ICA. Echo showed normal EF with mild AS mean gradient 11 mm Hg.   He did have upper endoscopy October showing some gastritis. He had ENT evaluation in Dec for hoarseness. Laryngoscopy was OK. He did get mixed up with his meds and was taking amlodipine instead of enalapril .   He was seen in the ED in April with complaints of HA. Had a fall off a ladder 3 days before and had a rib fracture. Began having a HA and went to ED. CT showed a small right frontal SAH. Was seen by Neurosurgery and Brilinta  held for 7 days. Follow up CT 07/25/22 was normal. He  did have a recent Echo showing normal LV function and mild AS with mean gradient of 15 mm Hg.  In March he had bilateral inguinal hernia repair.   Since December he noted some chest tightness with walking but only up incline and was able to walk 5 miles. Recently went on a trip to [place].  Gained weight. Not eating well. On his trip and since then has noted more chest tightness and discomfort with exertion at lower level. No rest angina.      Current Outpatient Medications on File Prior to Visit  Medication Sig Dispense Refill   aspirin  EC 81 MG tablet Take 81 mg by mouth at bedtime.     atorvastatin  (LIPITOR ) 10 MG tablet Take 1 tablet (10 mg total) by mouth daily. 90 tablet 3   carvedilol  (COREG ) 12.5 MG tablet Take 1 tablet (12.5 mg total) by mouth 2 (two) times daily. 180 tablet 3   enalapril  (VASOTEC ) 2.5 MG tablet Take 1 tablet (2.5 mg total) by mouth 2 (two) times daily. 180 tablet 3   fluticasone (FLONASE) 50 MCG/ACT nasal spray Place into both nostrils.     omeprazole (PRILOSEC) 40 MG capsule Take 40 mg by mouth at bedtime.     ticagrelor  (BRILINTA ) 60 MG TABS tablet Take 1 tablet (60 mg total) by mouth 2 (two) times daily. 180 tablet 3   No current facility-administered medications on file prior to visit.    Allergies  Allergen Reactions   Morphine  And Codeine Nausea And Vomiting   Sulfa Antibiotics Rash    FIXED DRUG ERUPTION    Past Medical History:  Diagnosis Date   Acute MI anterior wall subsequent episode care [facility])    2014   Anxiety    Borderline diabetes    CAD (coronary artery disease)  Dyslipidemia    Dysrhythmia 2014   V-FIB   GERD (gastroesophageal reflux disease)    Heart murmur    Pre-diabetes    Sciatica    Thyroid  nodule    normal thyroid  function per patient    Past Surgical History:  Procedure Laterality Date   BIOPSY THYROID      pt reports negative   CARDIAC CATHETERIZATION     CORONARY ANGIOPLASTY WITH STENT PLACEMENT  2014   2 STENTS IN LAD   ESOPHAGOGASTRODUODENOSCOPY (EGD) WITH PROPOFOL  N/A 12/19/2020   Procedure: ESOPHAGOGASTRODUODENOSCOPY (EGD) WITH PROPOFOL ;  Surgeon: Dessa Reyes ORN, MD;  Location: [facility] ENDOSCOPY;  Service: Endoscopy;  Laterality: N/A;  1ST CASE   INGUINAL HERNIA REPAIR Bilateral 06/08/2023    Procedure: LAPAROSCOPIC BILATERAL INGUINAL HERNIA REPAIR WITH MESH;  Surgeon: Rubin Calamity, MD;  Location: [facility] OR;  Service: General;  Laterality: Bilateral;   KNEE ARTHROSCOPY Right 12/29/2014   Procedure: ARTHROSCOPY KNEE partial medial menisectomy;  Surgeon: Lynwood SHAUNNA Hue, MD;  Location: [facility] ORS;  Service: Orthopedics;  Laterality: Right;   KNEE ARTHROSCOPY Left 05/09/2016   Procedure: ARTHROSCOPY KNEE, PARTIAL MEDIAL MENISECTOMY;  Surgeon: Lynwood SHAUNNA Hue, MD;  Location: [facility] ORS;  Service: Orthopedics;  Laterality: Left;   LEFT HEART CATH AND CORONARY ANGIOGRAPHY N/A 07/30/2018   Procedure: LEFT HEART CATH AND CORONARY ANGIOGRAPHY;  Surgeon: Verlin Lonni BIRCH, MD;  [facility] LAB;  Service: Cardiovascular;  Laterality: N/A;   LEFT HEART CATHETERIZATION WITH CORONARY ANGIOGRAM N/A 04/22/2012   Procedure: LEFT HEART CATHETERIZATION WITH CORONARY ANGIOGRAM;  Surgeon: Mavric Cortright M [place], MD;  Location: [facility] CATH LAB;  Service: Cardiovascular;  Laterality: N/A;   LEFT HEART CATHETERIZATION WITH CORONARY ANGIOGRAM N/A 04/22/2013   Procedure: LEFT HEART CATHETERIZATION WITH CORONARY ANGIOGRAM;  Surgeon: Salayah Meares M [place], MD;  Location: [facility] CATH LAB;  Service: Cardiovascular;  Laterality: N/A;   MANDIBLE OSTEOTOMY     VASECTOMY      Social History   Tobacco Use  Smoking Status Former   Types: Cigars   Quit date: 12/26/2012   Years since quitting: 10.7  Smokeless Tobacco Never    Social History   Substance and Sexual Activity  Alcohol Use Not Currently   Comment: rarely    Family History  Problem Relation Age of Onset   Hypertension Mother    Stroke Mother    Thyroid  disease Mother        thyroidectomy   COPD Father    Diabetes Father    Hypertension Father    Congestive Heart Failure Father     Review of Systems: As noted in history of present illness.   All other systems were reviewed and are negative.  Physical Exam: BP (!) 120/52   Pulse 69   Ht 5'  8 (1.727 m)   Wt 197 lb 3.2 oz (89.4 kg)   SpO2 98%   BMI 29.98 kg/m  GENERAL:  Well appearing male in NAD HEENT:  PERRL, EOMI, sclera are clear. Oropharynx is clear. NECK:  No jugular venous distention, carotid upstroke brisk and symmetric, bilateral  bruits, no thyromegaly or adenopathy LUNGS:  Clear to auscultation bilaterally CHEST:  Unremarkable HEART:  RRR,  PMI not displaced or sustained,S1 and S2 within normal limits, no S3, no S4: no clicks, no rubs, harsh gr 2/6 systolic murmur at the apex/RUSB ABD:  Soft, nontender. BS +, no masses or bruits. No hepatomegaly, no splenomegaly EXT:  2 + pulses throughout, no edema, no cyanosis no clubbing SKIN:  Warm and dry.  No rashes NEURO:  Alert and oriented x 3. Cranial nerves II through XII intact. PSYCH:  Cognitively intact    LABORATORY DATA: Labs reviewed from 02/11/16: Normal CMET. gluose 139. cholesterol 105, trig 150, HDL 43, LDL 32. CBC normal. A1c 5.5%. Normal thyroid  studies. Dated 05/26/17: Normal CMET. Cholesterol 108, triglycerides 77, HDL 49, LDL 44. TFTs normal. CBC normal. A1c 5%. CRP 0.59. Dated 04/05/18: glucose 121, otherwise chemistries normal. Cholesterol 120, triglycerides 180, HDL 36, LDL 48. TSH and CBC normal. A1c 5.7%.  Dated 07/09/18: glucose 136, otherwise CMET normal. Uric acid normal. Cholesterol 99, triglycerides 76, HDL 40, LDL 44. CBC and TSH normal. A1c 5.4%.  Dated 07/20/19: glucose 105. Otherwise CMET normal. Cholesterol 71, triglycerides 72, HDL 31, LDL 24. TFTS and CBC normal. A1c 5.4%.  Dated 05/22/21: cholesterol 103, triglycerides 65, HDL 42, LDL 47. Glucose 113, A1c 5.4%. CMET and CBC normal. TFTs normal.  Dated 07/24/22: glucose 118, uric acid 3.7. CMET normal. Cholesterol 96, triglycerides 77, HDL 36, LDL 44. TFTs and CBC normal. A1c 5.3%. CRP 0.28.   EKG Interpretation Date/Time:  Friday September 25 2023 14:00:59 EDT Ventricular Rate:  64 PR Interval:  176 QRS Duration:  68 QT Interval:  398 QTC  Calculation: 410 R Axis:   54  Text Interpretation: Normal sinus rhythm Anteroseptal infarct , old When compared with ECG of 26-Jun-2022 17:51, No significant change was found Confirmed by [place], Cheryl Chay [phone]) on 09/25/2023 2:11:14 PM   Echo done 05/26/17: normal LV function. Mild LVH. LAE. No valvular disease.   Cardiac cath 07/30/18:  LEFT HEART CATH AND CORONARY ANGIOGRAPHY  Conclusion      Prox RCA lesion is 40% stenosed. Mid RCA lesion is 20% stenosed. 2nd Mrg lesion is 20% stenosed. Prox LAD to Mid LAD lesion is 10% stenosed. The left ventricular systolic function is normal. LV end diastolic pressure is normal. The left ventricular ejection fraction is greater than 65% by visual estimate. There is no mitral valve regurgitation.   1. Patent stent proximal and mid LAD with minimal restenosis.  2. Mild non-obstructive disease in the Circumflex system 3. Moderate non-obstructive disease in the mid RCA. This does not appear to be flow limiting.  4. Preserved LV systolic function with apical hypokinesis   Recommendations: Medical management of CAD       Assessment / Plan: 1. CAD with remote Anterior STEMI complicated by ventricular fibrillation arrest in 2014. Status post DES x2 to the proximal and mid LAD in February 2014. Repeat cardiac cath in May 2020 showed nonobstructive disease and EF improved.   On long term DAPT with reduced dose Brilinta  indefinitely as long as no bleeding problems. He now has symptoms of progressive angina. Class 2-3. Recommend repeat ischemic evaluation. After discussion decided to proceed directly to cardiac cath. The procedure and risks were reviewed including but not limited to death, myocardial infarction, stroke, arrythmias, bleeding, transfusion, emergency surgery, dye allergy, or renal dysfunction. The patient voices understanding and is agreeable to proceed.   2. Ischemic cardiomyopathy. Initial Ejection fraction 25-35% with MI. Now improved to  normal based on last Echo. Cardiac cath in May 2020 showed EF 55% on recent Echo. Continue current doses of ACE inhibitor and carvedilol . Update Echo.   3. Dyslipidemia. Continue statin therapy. Dietary modification. Excellent labs. Last LDL at goal.   4. Prediabetes. Encourage  aerobic activity and weight loss. Last A1c 5.8%.   5. Mild AS.  Murmur suggest more severe AS. Will  update Echo now.   6. Carotid bruits. Nonobstructive disease by doppler in 2021.

## 2023-09-22 NOTE — Progress Notes (Signed)
 Patrick Gordon, Dr. 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 He is s/p anterior STEMI associated with Vfib arrest on 04/22/2012. He underwent emergent stenting of the proximal to mid LAD with 2 long drug-eluting stents. Ejection fraction at cath was 25-30%. Echocardiogram demonstrated an ejection fraction 35%.Subsequent evaluation in [place] showed an EF 51% with small apical defect. He was concerned enough about his cardiac status that he underwent repeat cardiac cath on Feb. 6, 2015. This showed nonobstructive CAD with patent stents. EF was 50-55% with apical wall motion abnormality.   In May 2020 he complained of symptoms of dyspnea on exertion and some chest pressure. This led to a cardiac cath showing nonobstructive CAD and good patency of LAD stents. Good LV function with EF 50-55%. EDP was normal.  He wore a Zio patch monitor in Sept 2021 and this showed rare isolated PACs and PVCs with one PVC couplet. Carotid dopplers showed mild nonobstructive disease on the right ICA. Echo showed normal EF with mild AS mean gradient 11 mm Hg.   He did have upper endoscopy October showing some gastritis. He had ENT evaluation in Dec for hoarseness. Laryngoscopy was OK. He did get mixed up with his meds and was taking amlodipine instead of enalapril .   He was seen in the ED in April with complaints of HA. Had a fall off a ladder 3 days before and had a rib fracture. Began having a HA and went to ED. CT showed a small right frontal SAH. Was seen by Neurosurgery and Brilinta  held for 7 days. Follow up CT 07/25/22 was normal. He  did have a recent Echo showing normal LV function and mild AS with mean gradient of 15 mm Hg.  In March he had bilateral inguinal hernia repair.   Since December he noted some chest tightness with walking but only up incline and was able to walk 5 miles. Recently went on a trip to [place].  Gained weight. Not eating well. On his trip and since then has noted more chest tightness and discomfort with exertion at lower level. No rest angina.      Current Outpatient Medications on File Prior to Visit  Medication Sig Dispense Refill   aspirin  EC 81 MG tablet Take 81 mg by mouth at bedtime.     atorvastatin  (LIPITOR ) 10 MG tablet Take 1 tablet (10 mg total) by mouth daily. 90 tablet 3   carvedilol  (COREG ) 12.5 MG tablet Take 1 tablet (12.5 mg total) by mouth 2 (two) times daily. 180 tablet 3   enalapril  (VASOTEC ) 2.5 MG tablet Take 1 tablet (2.5 mg total) by mouth 2 (two) times daily. 180 tablet 3   fluticasone (FLONASE) 50 MCG/ACT nasal spray Place into both nostrils.     omeprazole (PRILOSEC) 40 MG capsule Take 40 mg by mouth at bedtime.     ticagrelor  (BRILINTA ) 60 MG TABS tablet Take 1 tablet (60 mg total) by mouth 2 (two) times daily. 180 tablet 3   No current facility-administered medications on file prior to visit.    Allergies  Allergen Reactions   Morphine  And Codeine Nausea And Vomiting   Sulfa Antibiotics Rash    FIXED DRUG ERUPTION    Past Medical History:  Diagnosis Date   Acute MI anterior wall subsequent episode care [facility])    2014   Anxiety    Borderline diabetes    CAD (coronary artery disease)  Dyslipidemia    Dysrhythmia 2014   V-FIB   GERD (gastroesophageal reflux disease)    Heart murmur    Pre-diabetes    Sciatica    Thyroid  nodule    normal thyroid  function per patient    Past Surgical History:  Procedure Laterality Date   BIOPSY THYROID      pt reports negative   CARDIAC CATHETERIZATION     CORONARY ANGIOPLASTY WITH STENT PLACEMENT  2014   2 STENTS IN LAD   ESOPHAGOGASTRODUODENOSCOPY (EGD) WITH PROPOFOL  N/A 12/19/2020   Procedure: ESOPHAGOGASTRODUODENOSCOPY (EGD) WITH PROPOFOL ;  Surgeon: Dessa Reyes ORN, MD;  Location: [facility] ENDOSCOPY;  Service: Endoscopy;  Laterality: N/A;  1ST CASE   INGUINAL HERNIA REPAIR Bilateral 06/08/2023    Procedure: LAPAROSCOPIC BILATERAL INGUINAL HERNIA REPAIR WITH MESH;  Surgeon: Rubin Calamity, MD;  Location: [facility] OR;  Service: General;  Laterality: Bilateral;   KNEE ARTHROSCOPY Right 12/29/2014   Procedure: ARTHROSCOPY KNEE partial medial menisectomy;  Surgeon: Lynwood SHAUNNA Hue, MD;  Location: [facility] ORS;  Service: Orthopedics;  Laterality: Right;   KNEE ARTHROSCOPY Left 05/09/2016   Procedure: ARTHROSCOPY KNEE, PARTIAL MEDIAL MENISECTOMY;  Surgeon: Lynwood SHAUNNA Hue, MD;  Location: [facility] ORS;  Service: Orthopedics;  Laterality: Left;   LEFT HEART CATH AND CORONARY ANGIOGRAPHY N/A 07/30/2018   Procedure: LEFT HEART CATH AND CORONARY ANGIOGRAPHY;  Surgeon: Verlin Lonni BIRCH, MD;  [facility];  Service: Cardiovascular;  Laterality: N/A;   LEFT HEART CATHETERIZATION WITH CORONARY ANGIOGRAM N/A 04/22/2012   Procedure: LEFT HEART CATHETERIZATION WITH CORONARY ANGIOGRAM;  Surgeon: Mavric Cortright M [place], MD;  Location: [facility] CATH LAB;  Service: Cardiovascular;  Laterality: N/A;   LEFT HEART CATHETERIZATION WITH CORONARY ANGIOGRAM N/A 04/22/2013   Procedure: LEFT HEART CATHETERIZATION WITH CORONARY ANGIOGRAM;  Surgeon: Salayah Meares M [place], MD;  Location: [facility] CATH LAB;  Service: Cardiovascular;  Laterality: N/A;   MANDIBLE OSTEOTOMY     VASECTOMY      Social History   Tobacco Use  Smoking Status Former   Types: Cigars   Quit date: 12/26/2012   Years since quitting: 10.7  Smokeless Tobacco Never    Social History   Substance and Sexual Activity  Alcohol Use Not Currently   Comment: rarely    Family History  Problem Relation Age of Onset   Hypertension Mother    Stroke Mother    Thyroid  disease Mother        thyroidectomy   COPD Father    Diabetes Father    Hypertension Father    Congestive Heart Failure Father     Review of Systems: As noted in history of present illness.   All other systems were reviewed and are negative.  Physical Exam: BP (!) 120/52   Pulse 69   Ht 5'  8 (1.727 m)   Wt 197 lb 3.2 oz (89.4 kg)   SpO2 98%   BMI 29.98 kg/m  GENERAL:  Well appearing male in NAD HEENT:  PERRL, EOMI, sclera are clear. Oropharynx is clear. NECK:  No jugular venous distention, carotid upstroke brisk and symmetric, bilateral  bruits, no thyromegaly or adenopathy LUNGS:  Clear to auscultation bilaterally CHEST:  Unremarkable HEART:  RRR,  PMI not displaced or sustained,S1 and S2 within normal limits, no S3, no S4: no clicks, no rubs, harsh gr 2/6 systolic murmur at the apex/RUSB ABD:  Soft, nontender. BS +, no masses or bruits. No hepatomegaly, no splenomegaly EXT:  2 + pulses throughout, no edema, no cyanosis no clubbing SKIN:  Warm and dry.  No rashes NEURO:  Alert and oriented x 3. Cranial nerves II through XII intact. PSYCH:  Cognitively intact    LABORATORY DATA: Labs reviewed from 02/11/16: Normal CMET. gluose 139. cholesterol 105, trig 150, HDL 43, LDL 32. CBC normal. A1c 5.5%. Normal thyroid  studies. Dated 05/26/17: Normal CMET. Cholesterol 108, triglycerides 77, HDL 49, LDL 44. TFTs normal. CBC normal. A1c 5%. CRP 0.59. Dated 04/05/18: glucose 121, otherwise chemistries normal. Cholesterol 120, triglycerides 180, HDL 36, LDL 48. TSH and CBC normal. A1c 5.7%.  Dated 07/09/18: glucose 136, otherwise CMET normal. Uric acid normal. Cholesterol 99, triglycerides 76, HDL 40, LDL 44. CBC and TSH normal. A1c 5.4%.  Dated 07/20/19: glucose 105. Otherwise CMET normal. Cholesterol 71, triglycerides 72, HDL 31, LDL 24. TFTS and CBC normal. A1c 5.4%.  Dated 05/22/21: cholesterol 103, triglycerides 65, HDL 42, LDL 47. Glucose 113, A1c 5.4%. CMET and CBC normal. TFTs normal.  Dated 07/24/22: glucose 118, uric acid 3.7. CMET normal. Cholesterol 96, triglycerides 77, HDL 36, LDL 44. TFTs and CBC normal. A1c 5.3%. CRP 0.28.   EKG Interpretation Date/Time:  Friday September 25 2023 14:00:59 EDT Ventricular Rate:  64 PR Interval:  176 QRS Duration:  68 QT Interval:  398 QTC  Calculation: 410 R Axis:   54  Text Interpretation: Normal sinus rhythm Anteroseptal infarct , old When compared with ECG of 26-Jun-2022 17:51, No significant change was found Confirmed by [place], Cheryl Chay [phone]) on 09/25/2023 2:11:14 PM   Echo done 05/26/17: normal LV function. Mild LVH. LAE. No valvular disease.   Cardiac cath 07/30/18:  LEFT HEART CATH AND CORONARY ANGIOGRAPHY  Conclusion      Prox RCA lesion is 40% stenosed. Mid RCA lesion is 20% stenosed. 2nd Mrg lesion is 20% stenosed. Prox LAD to Mid LAD lesion is 10% stenosed. The left ventricular systolic function is normal. LV end diastolic pressure is normal. The left ventricular ejection fraction is greater than 65% by visual estimate. There is no mitral valve regurgitation.   1. Patent stent proximal and mid LAD with minimal restenosis.  2. Mild non-obstructive disease in the Circumflex system 3. Moderate non-obstructive disease in the mid RCA. This does not appear to be flow limiting.  4. Preserved LV systolic function with apical hypokinesis   Recommendations: Medical management of CAD       Assessment / Plan: 1. CAD with remote Anterior STEMI complicated by ventricular fibrillation arrest in 2014. Status post DES x2 to the proximal and mid LAD in February 2014. Repeat cardiac cath in May 2020 showed nonobstructive disease and EF improved.   On long term DAPT with reduced dose Brilinta  indefinitely as long as no bleeding problems. He now has symptoms of progressive angina. Class 2-3. Recommend repeat ischemic evaluation. After discussion decided to proceed directly to cardiac cath. The procedure and risks were reviewed including but not limited to death, myocardial infarction, stroke, arrythmias, bleeding, transfusion, emergency surgery, dye allergy, or renal dysfunction. The patient voices understanding and is agreeable to proceed.   2. Ischemic cardiomyopathy. Initial Ejection fraction 25-35% with MI. Now improved to  normal based on last Echo. Cardiac cath in May 2020 showed EF 55% on recent Echo. Continue current doses of ACE inhibitor and carvedilol . Update Echo.   3. Dyslipidemia. Continue statin therapy. Dietary modification. Excellent labs. Last LDL at goal.   4. Prediabetes. Encourage  aerobic activity and weight loss. Last A1c 5.8%.   5. Mild AS.  Murmur suggest more severe AS. Will  update Echo now.   6. Carotid bruits. Nonobstructive disease by doppler in 2021.

## 2023-09-25 ENCOUNTER — Other Ambulatory Visit: Payer: Self-pay | Admitting: Cardiology

## 2023-09-25 ENCOUNTER — Encounter: Payer: Self-pay | Admitting: Cardiology

## 2023-09-25 ENCOUNTER — Ambulatory Visit: Attending: Cardiology | Admitting: Cardiology

## 2023-09-25 VITALS — BP 120/52 | HR 69 | Ht 68.0 in | Wt 197.2 lb

## 2023-09-25 DIAGNOSIS — I350 Nonrheumatic aortic (valve) stenosis: Secondary | ICD-10-CM | POA: Insufficient documentation

## 2023-09-25 DIAGNOSIS — E785 Hyperlipidemia, unspecified: Secondary | ICD-10-CM | POA: Insufficient documentation

## 2023-09-25 DIAGNOSIS — I25110 Atherosclerotic heart disease of native coronary artery with unstable angina pectoris: Secondary | ICD-10-CM | POA: Diagnosis present

## 2023-09-25 DIAGNOSIS — E119 Type 2 diabetes mellitus without complications: Secondary | ICD-10-CM | POA: Insufficient documentation

## 2023-09-25 DIAGNOSIS — I209 Angina pectoris, unspecified: Secondary | ICD-10-CM

## 2023-09-25 NOTE — Patient Instructions (Signed)
 Medication Instructions:  Continue same medications *If you need a refill on your cardiac medications before your next appointment, please call your pharmacy*  Lab Work: Have fasting lipid panel,A1c,cmet,cbc Mon 7/14   Testing/Procedures: Scheduled Echo before 7/21  Cardiac Cath scheduled at [facility] Monday 7/21 Arrive at 5:30 am    Follow instructions   Follow-Up: At [facility], you and your health needs are our priority.  As part of our continuing mission to provide you with exceptional heart care, our providers are all part of one team.  This team includes your primary Cardiologist (physician) and Advanced Practice Providers or APPs (Physician Assistants and Nurse Practitioners) who all work together to provide you with the care you need, when you need it.  Your next appointment:  After cath    Provider:  Dr.Jordan      [place] [facility] A DEPT OF [place] AT [address] A DEPT OF THE [place] [address] [place] [address] Dept: [phone] Loc: [phone]  Patrick Gordon, Dr.  09/25/2023  You are scheduled for a Cardiac Catheterization on Monday, July 21 with Dr. Peter Gordon.  1. Please arrive at the [facility] (Main Entrance A) at [facility]: [address] [place], [place] [address] at 5:30 AM (This time is 2 hour(s) before your procedure to ensure your preparation).   Free valet parking service is available. You will check in at ADMITTING. The support person will be asked to wait in the waiting room.  It is OK to have someone drop you off and come back when you are ready to be discharged.    Special note: Every effort is made to have your procedure done on time. Please understand that emergencies sometimes delay scheduled procedures.  2. Diet: Do not eat solid foods after midnight.  The patient may have clear liquids until 5am upon the day of the procedure.  3. Labs: You will need  to have blood drawn on Fasting Monday 7/14   4. Medication instructions in preparation for your procedure:     Drink 12 oz water on the way to hospital     On the morning of your procedure, take your Aspirin  81 mg and Brilinta /Ticagrelor  and any morning medicines NOT listed above.  You may use sips of water.  5. Plan to go home the same day, you will only stay overnight if medically necessary. 6. Bring a current list of your medications and current insurance cards. 7. You MUST have a responsible person to drive you home. 8. Someone MUST be with you the first 24 hours after you arrive home or your discharge will be delayed. 9. Please wear clothes that are easy to get on and off and wear slip-on shoes.  Thank you for allowing us  to care for you!   -- [facility]   We recommend signing up for the patient portal called MyChart.  Sign up information is provided on this After Visit Summary.  MyChart is used to connect with patients for Virtual Visits (Telemedicine).  Patients are able to view lab/test results, encounter notes, upcoming appointments, etc.  Non-urgent messages can be sent to your provider as well.   To learn more about what you can do with MyChart, go to [email or web address].

## 2023-09-28 LAB — LIPID PANEL

## 2023-09-28 LAB — CBC WITH DIFFERENTIAL/PLATELET

## 2023-09-29 ENCOUNTER — Ambulatory Visit: Payer: Self-pay | Admitting: Cardiology

## 2023-09-29 LAB — LIPID PANEL
Cholesterol, Total: 114 mg/dL (ref 100–199)
HDL: 48 mg/dL (ref >39)
LDL CALC COMMENT:: 2.4 ratio (ref 0.0–5.0)
LDL Chol Calc (NIH): 52 mg/dL (ref 0–99)
Triglycerides: 65 mg/dL (ref 0–149)
VLDL Cholesterol Cal: 14 mg/dL (ref 5–40)

## 2023-09-29 LAB — COMPREHENSIVE METABOLIC PANEL WITH GFR
ALT: 24 IU/L (ref 0–44)
AST: 20 IU/L (ref 0–40)
Albumin: 4 g/dL (ref 3.8–4.8)
Alkaline Phosphatase: 69 IU/L (ref 44–121)
BUN/Creatinine Ratio: 14 (ref 10–24)
BUN: 11 mg/dL (ref 8–27)
Bilirubin Total: 0.7 mg/dL (ref 0.0–1.2)
CO2: 20 mmol/L (ref 20–29)
Calcium: 8.9 mg/dL (ref 8.6–10.2)
Chloride: 104 mmol/L (ref 96–106)
Creatinine, Ser: 0.76 mg/dL (ref 0.76–1.27)
Globulin, Total: 2.7 g/dL (ref 1.5–4.5)
Glucose: 110 mg/dL — ABNORMAL HIGH (ref 70–99)
Potassium: 4.4 mmol/L (ref 3.5–5.2)
Sodium: 140 mmol/L (ref 134–144)
Total Protein: 6.7 g/dL (ref 6.0–8.5)
eGFR: 96 mL/min/1.73 (ref >59)

## 2023-09-29 LAB — CBC WITH DIFFERENTIAL/PLATELET
Basos: 1
EOS (ABSOLUTE): 0.1 x10E3/uL (ref 0.0–0.2)
Eos: 5
Hematocrit: 41 (ref 37.5–51.0)
Hemoglobin: 13.1 g/dL (ref 13.0–17.7)
Immature Granulocytes: 0
Immature Granulocytes: 0 x10E3/uL (ref 0.0–0.1)
Lymphs: 28
MCH: 29.7 pg (ref 26.6–33.0)
MCHC: 32 g/dL (ref 31.5–35.7)
MCV: 93 fL (ref 79–97)
Monocytes Absolute: 0.2 x10E3/uL (ref 0.0–0.4)
Monocytes Absolute: 0.4 x10E3/uL (ref 0.1–0.9)
Monocytes: 8
Neutrophils Absolute: 1.4 x10E3/uL (ref 0.7–3.1)
Neutrophils Absolute: 2.9 x10E3/uL (ref 1.4–7.0)
Neutrophils: 58
Platelets: 169 x10E3/uL (ref 150–450)
RBC: 4.41 x10E6/uL (ref 4.14–5.80)
RDW: 13.9 (ref 11.6–15.4)
WBC: 5.1 x10E3/uL (ref 3.4–10.8)

## 2023-09-29 LAB — HM DIABETES EYE EXAM

## 2023-09-29 LAB — HEMOGLOBIN A1C
Est. average glucose Bld gHb Est-mCnc: 105 mg/dL
Hgb A1c MFr Bld: 5.3 (ref 4.8–5.6)

## 2023-09-30 ENCOUNTER — Other Ambulatory Visit

## 2023-09-30 ENCOUNTER — Ambulatory Visit (HOSPITAL_COMMUNITY)
Admission: RE | Admit: 2023-09-30 | Discharge: 2023-09-30 | Disposition: A | Discharge status: Home / Self Care | Source: Ambulatory Visit | Attending: Cardiovascular Disease | Admitting: Cardiovascular Disease

## 2023-09-30 ENCOUNTER — Ambulatory Visit: Payer: Self-pay | Admitting: Family

## 2023-09-30 DIAGNOSIS — I25110 Atherosclerotic heart disease of native coronary artery with unstable angina pectoris: Secondary | ICD-10-CM | POA: Diagnosis not present

## 2023-09-30 DIAGNOSIS — E785 Hyperlipidemia, unspecified: Secondary | ICD-10-CM

## 2023-09-30 DIAGNOSIS — I350 Nonrheumatic aortic (valve) stenosis: Secondary | ICD-10-CM

## 2023-09-30 LAB — ECHOCARDIOGRAM COMPLETE
AR max vel: 0.91 cm2
AV Area VTI: 0.89 cm2
AV Area mean vel: 0.85 cm2
AV Mean grad: 38 mmHg
AV Peak grad: 57.2 mmHg
Ao pk vel: 3.78 m/s
Area-P 1/2: 4.6 cm2
P 1/2 time: 306 ms
S' Lateral: 2.6 cm

## 2023-09-30 NOTE — Progress Notes (Signed)
 noted

## 2023-10-01 ENCOUNTER — Telehealth: Payer: Self-pay | Admitting: *Deleted

## 2023-10-01 NOTE — Telephone Encounter (Signed)
 Cardiac Catheterization scheduled at [facility] for: Monday October 05, 2023 7:30 AM Arrival time [facility] Main Entrance A at: 5:30 AM  Nothing to eat after midnight prior to procedure, clear liquids until 5 AM day of procedure.  Medication instructions: -Usual morning medications can be taken with sips of water including aspirin  81 mg and Brilinta  60 mg  Plan to go home the same day, you will only stay overnight if medically necessary.  You must have responsible adult to drive you home.  Someone must be with you the first 24 hours after you arrive home.  Reviewed procedure instructions with patient.

## 2023-10-05 ENCOUNTER — Other Ambulatory Visit: Payer: Self-pay

## 2023-10-05 ENCOUNTER — Encounter (HOSPITAL_COMMUNITY)
Admission: RE | Disposition: A | Discharge status: Home / Self Care | Payer: Self-pay | Source: Home / Self Care | Attending: Cardiology

## 2023-10-05 ENCOUNTER — Encounter (HOSPITAL_COMMUNITY): Payer: Self-pay | Admitting: Cardiology

## 2023-10-05 ENCOUNTER — Ambulatory Visit (HOSPITAL_COMMUNITY)
Admission: RE | Admit: 2023-10-05 | Discharge: 2023-10-05 | Disposition: A | Discharge status: Home / Self Care | Attending: Cardiology | Admitting: Cardiology

## 2023-10-05 DIAGNOSIS — R0989 Other specified symptoms and signs involving the circulatory and respiratory systems: Secondary | ICD-10-CM | POA: Diagnosis not present

## 2023-10-05 DIAGNOSIS — Z87891 Personal history of nicotine dependence: Secondary | ICD-10-CM | POA: Insufficient documentation

## 2023-10-05 DIAGNOSIS — I255 Ischemic cardiomyopathy: Secondary | ICD-10-CM | POA: Insufficient documentation

## 2023-10-05 DIAGNOSIS — R7303 Prediabetes: Secondary | ICD-10-CM | POA: Insufficient documentation

## 2023-10-05 DIAGNOSIS — I252 Old myocardial infarction: Secondary | ICD-10-CM | POA: Insufficient documentation

## 2023-10-05 DIAGNOSIS — Z8674 Personal history of sudden cardiac arrest: Secondary | ICD-10-CM | POA: Diagnosis not present

## 2023-10-05 DIAGNOSIS — Z7902 Long term (current) use of antithrombotics/antiplatelets: Secondary | ICD-10-CM | POA: Insufficient documentation

## 2023-10-05 DIAGNOSIS — I272 Pulmonary hypertension, unspecified: Secondary | ICD-10-CM | POA: Diagnosis not present

## 2023-10-05 DIAGNOSIS — Z955 Presence of coronary angioplasty implant and graft: Secondary | ICD-10-CM | POA: Diagnosis not present

## 2023-10-05 DIAGNOSIS — Z79899 Other long term (current) drug therapy: Secondary | ICD-10-CM | POA: Insufficient documentation

## 2023-10-05 DIAGNOSIS — I350 Nonrheumatic aortic (valve) stenosis: Secondary | ICD-10-CM

## 2023-10-05 DIAGNOSIS — E785 Hyperlipidemia, unspecified: Secondary | ICD-10-CM | POA: Insufficient documentation

## 2023-10-05 DIAGNOSIS — I209 Angina pectoris, unspecified: Secondary | ICD-10-CM

## 2023-10-05 DIAGNOSIS — I2510 Atherosclerotic heart disease of native coronary artery without angina pectoris: Secondary | ICD-10-CM | POA: Diagnosis not present

## 2023-10-05 HISTORY — PX: RIGHT/LEFT HEART CATH AND CORONARY ANGIOGRAPHY: CATH118266

## 2023-10-05 LAB — POCT I-STAT 7, (LYTES, BLD GAS, ICA,H+H)
Acid-base deficit: 2 mmol/L (ref 0.0–2.0)
Bicarbonate: 22.2 mmol/L (ref 20.0–28.0)
Calcium, Ion: 1.19 mmol/L (ref 1.15–1.40)
HCT: 38 % — ABNORMAL LOW (ref 39.0–52.0)
Hemoglobin: 12.9 g/dL — ABNORMAL LOW (ref 13.0–17.0)
O2 Saturation: 99 %
Potassium: 3.9 mmol/L (ref 3.5–5.1)
Sodium: 141 mmol/L (ref 135–145)
TCO2: 23 mmol/L (ref 22–32)
pCO2 arterial: 37.2 mmHg (ref 32–48)
pH, Arterial: 7.384 (ref 7.35–7.45)
pO2, Arterial: 137 mmHg — ABNORMAL HIGH (ref 83–108)

## 2023-10-05 LAB — POCT I-STAT EG7
Acid-base deficit: 2 mmol/L (ref 0.0–2.0)
Acid-base deficit: 2 mmol/L (ref 0.0–2.0)
Bicarbonate: 23.8 mmol/L (ref 20.0–28.0)
Bicarbonate: 23.9 mmol/L (ref 20.0–28.0)
Calcium, Ion: 1.24 mmol/L (ref 1.15–1.40)
Calcium, Ion: 1.24 mmol/L (ref 1.15–1.40)
HCT: 38 % — ABNORMAL LOW (ref 39.0–52.0)
HCT: 39 % (ref 39.0–52.0)
Hemoglobin: 12.9 g/dL — ABNORMAL LOW (ref 13.0–17.0)
Hemoglobin: 13.3 g/dL (ref 13.0–17.0)
O2 Saturation: 77 %
O2 Saturation: 78 %
Potassium: 4 mmol/L (ref 3.5–5.1)
Potassium: 4 mmol/L (ref 3.5–5.1)
Sodium: 141 mmol/L (ref 135–145)
Sodium: 142 mmol/L (ref 135–145)
TCO2: 25 mmol/L (ref 22–32)
TCO2: 25 mmol/L (ref 22–32)
pCO2, Ven: 42 mmHg — ABNORMAL LOW (ref 44–60)
pCO2, Ven: 42.6 mmHg — ABNORMAL LOW (ref 44–60)
pH, Ven: 7.355 (ref 7.25–7.43)
pH, Ven: 7.362 (ref 7.25–7.43)
pO2, Ven: 43 mmHg (ref 32–45)
pO2, Ven: 44 mmHg (ref 32–45)

## 2023-10-05 LAB — GLUCOSE, CAPILLARY
Glucose-Capillary: 128 mg/dL — ABNORMAL HIGH (ref 70–99)
Glucose-Capillary: 115 mg/dL — ABNORMAL HIGH (ref 70–99)

## 2023-10-05 SURGERY — RIGHT/LEFT HEART CATH AND CORONARY ANGIOGRAPHY
Anesthesia: LOCAL

## 2023-10-05 MED ORDER — ASPIRIN 81 MG PO CHEW: 81.0000 mg | CHEWABLE_TABLET | ORAL | Status: DC

## 2023-10-05 MED ORDER — ONDANSETRON HCL 4 MG/2ML IJ SOLN: 4.0000 mg | Freq: Four times a day (QID) | INTRAMUSCULAR | Status: DC | PRN

## 2023-10-05 MED ORDER — IOHEXOL 350 MG/ML SOLN
INTRAVENOUS | Status: DC | PRN
  Administered 2023-10-05: 50 mL

## 2023-10-05 MED ORDER — HEPARIN SODIUM (PORCINE) 1000 UNIT/ML IJ SOLN
INTRAMUSCULAR | Status: AC
  Filled 2023-10-05: qty 10

## 2023-10-05 MED ORDER — FENTANYL CITRATE (PF) 100 MCG/2ML IJ SOLN
INTRAMUSCULAR | Status: DC | PRN
  Administered 2023-10-05 (×2): 25 ug via INTRAVENOUS

## 2023-10-05 MED ORDER — HEPARIN (PORCINE) IN NACL 1000-0.9 UT/500ML-% IV SOLN
INTRAVENOUS | Status: DC | PRN
Start: 2023-10-05 — End: 2023-10-05
  Administered 2023-10-05: 1000 mL

## 2023-10-05 MED ORDER — LIDOCAINE HCL (PF) 1 % IJ SOLN
INTRAMUSCULAR | Status: DC | PRN
Start: 2023-10-05 — End: 2023-10-05
  Administered 2023-10-05 (×2): 2 mL

## 2023-10-05 MED ORDER — SODIUM CHLORIDE 0.9% FLUSH: 3.0000 mL | INTRAVENOUS | Status: DC | PRN

## 2023-10-05 MED ORDER — HEPARIN SODIUM (PORCINE) 1000 UNIT/ML IJ SOLN
INTRAMUSCULAR | Status: DC | PRN
Start: 2023-10-05 — End: 2023-10-05
  Administered 2023-10-05: 4500 [IU] via INTRAVENOUS

## 2023-10-05 MED ORDER — SODIUM CHLORIDE 0.9 % IV SOLN: 250.0000 mL | INTRAVENOUS | Status: DC | PRN

## 2023-10-05 MED ORDER — VERAPAMIL HCL 2.5 MG/ML IV SOLN
INTRAVENOUS | Status: DC | PRN
  Administered 2023-10-05: 10 mL via INTRA_ARTERIAL

## 2023-10-05 MED ORDER — LABETALOL HCL 5 MG/ML IV SOLN: 10.0000 mg | INTRAVENOUS | Status: DC | PRN

## 2023-10-05 MED ORDER — VERAPAMIL HCL 2.5 MG/ML IV SOLN
INTRAVENOUS | Status: AC
  Filled 2023-10-05: qty 2

## 2023-10-05 MED ORDER — LIDOCAINE HCL (PF) 1 % IJ SOLN
INTRAMUSCULAR | Status: AC
  Filled 2023-10-05: qty 30

## 2023-10-05 MED ORDER — FENTANYL CITRATE (PF) 100 MCG/2ML IJ SOLN
INTRAMUSCULAR | Status: AC
  Filled 2023-10-05: qty 2

## 2023-10-05 MED ORDER — SODIUM CHLORIDE 0.9 % IV SOLN: INTRAVENOUS | Status: DC

## 2023-10-05 MED ORDER — SODIUM CHLORIDE 0.9% FLUSH: 3.0000 mL | Freq: Two times a day (BID) | INTRAVENOUS | Status: DC

## 2023-10-05 MED ORDER — MIDAZOLAM HCL 2 MG/2ML IJ SOLN
INTRAMUSCULAR | Status: DC | PRN
Start: 2023-10-05 — End: 2023-10-05
  Administered 2023-10-05 (×2): 1 mg via INTRAVENOUS

## 2023-10-05 MED ORDER — MIDAZOLAM HCL 2 MG/2ML IJ SOLN
INTRAMUSCULAR | Status: AC
  Filled 2023-10-05: qty 2

## 2023-10-05 MED ORDER — HYDRALAZINE HCL 20 MG/ML IJ SOLN: 10.0000 mg | INTRAMUSCULAR | Status: DC | PRN

## 2023-10-05 MED ORDER — ACETAMINOPHEN 325 MG PO TABS
650.0000 mg | ORAL_TABLET | ORAL | Status: DC | PRN
Start: 2023-10-05 — End: 2023-10-05

## 2023-10-05 SURGICAL SUPPLY — 10 items
CATH 5FR JL3.5 JR4 ANG PIG MP (CATHETERS) IMPLANT
CATH BALLN WEDGE 5F 110CM (CATHETERS) IMPLANT
DEVICE RAD COMP TR BAND LRG (VASCULAR PRODUCTS) IMPLANT
GLIDESHEATH SLEND SS 6F .021 (SHEATH) IMPLANT
GUIDEWIRE INQWIRE 1.5J.035X260 (WIRE) IMPLANT
KIT SYRINGE INJ CVI SPIKEX1 (MISCELLANEOUS) IMPLANT
PACK CARDIAC CATHETERIZATION (CUSTOM PROCEDURE TRAY) ×1 IMPLANT
SET ATX-X65L (MISCELLANEOUS) IMPLANT
SHEATH GLIDE SLENDER 4/5FR (SHEATH) IMPLANT
SHEATH PROBE COVER 6X72 (BAG) IMPLANT

## 2023-10-05 NOTE — Progress Notes (Signed)
 noted

## 2023-10-05 NOTE — Discharge Instructions (Addendum)
 Radial Site Care The following information offers guidance on how to care for yourself after your procedure. Your health care provider may also give you more specific instructions. If you have problems or questions, contact your health care provider. What can I expect after the procedure? After the procedure, it is common to have bruising and tenderness in the incision area. Follow these instructions at home: Incision site care  Follow instructions from your health care provider about how to take care of your incision site. Make sure you: Wash your hands with soap and water for at least 20 seconds before and after you change your bandage (dressing). If soap and water are not available, use hand sanitizer. Remove your dressing in 24 hours. Leave stitches (sutures), skin glue, or adhesive strips in place. These skin closures may need to stay in place for 2 weeks or longer. If adhesive strip edges start to loosen and curl up, you may trim the loose edges. Do not remove adhesive strips completely unless your health care provider tells you to do that. Do not take baths, swim, or use a hot tub for at least 1 week. You may shower 24 hours after the procedure or as told by your health care provider. Remove the dressing and gently wash the incision area with plain soap and water. Pat the area dry with a clean towel. Do not rub the site. That could cause bleeding. Do not apply powder or lotion to the site. Check your incision site every day for signs of infection. Check for: Redness, swelling, or pain. Fluid or blood. Warmth. Pus or a bad smell. Activity For 24 hours after the procedure, or as directed by your health care provider: Do not flex or bend the affected arm. Do not push or pull heavy objects with the affected arm. Do not operate machinery or power tools. Do not drive. You should not drive yourself home from the hospital or clinic if you go home during that time period. You may drive 24  hours after the procedure unless your health care provider tells you not to. Do not lift anything that is heavier than 10 lb (4.5 kg), or the limit that you are told, until your health care provider says that it is safe. Return to your normal activities as told by your health care provider. Ask your health care provider what activities are safe for you and when you can return to work. If you were given a sedative during the procedure, it can affect you for several hours. Do not drive or operate machinery until your health care provider says that it is safe. General instructions Take over-the-counter and prescription medicines only as told by your health care provider. If you will be going home right after the procedure, plan to have a responsible adult care for you for the time you are told. This is important. Keep all follow-up visits. This is important. Contact a health care provider if: You have a fever or chills. You have any of these signs of infection at your incision site: Redness, swelling, or pain. Fluid or blood. Warmth. Pus or a bad smell. Get help right away if: The incision area swells very fast. The incision area is bleeding, and the bleeding does not stop when you hold steady pressure on the area. Your arm or hand becomes pale, cool, tingly, or numb. These symptoms may represent a serious problem that is an emergency. Do not wait to see if the symptoms will go away. Get medical  help right away. Call your local emergency services (911 in the [place] Do not drive yourself to the hospital. Summary After the procedure, it is common to have bruising and tenderness at the incision site. Follow instructions from your health care provider about how to take care of your radial site incision. Check the incision every day for signs of infection. Do not lift anything that is heavier than 10 lb (4.5 kg), or the limit that you are told, until your health care provider says that it is  safe. Get help right away if the incision area swells very fast, you have bleeding at the incision site that will not stop, or your arm or hand becomes pale, cool, or numb. This information is not intended to replace advice given to you by your health care provider. Make sure you discuss any questions you have with your health care provider. Document Revised: 04/22/2020 Document Reviewed: 04/22/2020 Elsevier Patient Education  2024 [employer].

## 2023-10-05 NOTE — Interval H&P Note (Signed)
 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  The various methods of treatment have been discussed with the patient and family. After consideration of risks, benefits and other options for treatment, the patient has consented to  Procedure(s): LEFT HEART CATH AND CORONARY ANGIOGRAPHY (N/A) as a surgical intervention.  The patient's history has been reviewed, patient examined, no change in status, stable for surgery.  I have reviewed the patient's chart and labs.  Questions were answered to the patient's satisfaction.   Cath Lab Visit (complete for each Cath Lab visit)  Clinical Evaluation Leading to the Procedure:   ACS: No.  Non-ACS:    Anginal Classification: CCS III  Anti-ischemic medical therapy: Minimal Therapy (1 class of medications)  Non-Invasive Test Results: No non-invasive testing performed  Prior CABG: No previous CABG        [facility] 10/05/2023 7:19 AM

## 2023-10-07 ENCOUNTER — Ambulatory Visit: Payer: Self-pay | Admitting: Family

## 2023-10-07 NOTE — Progress Notes (Signed)
 noted

## 2023-10-08 ENCOUNTER — Ambulatory Visit: Attending: Cardiovascular Disease | Admitting: Cardiovascular Disease

## 2023-10-08 ENCOUNTER — Encounter: Payer: Self-pay | Admitting: Cardiovascular Disease

## 2023-10-08 VITALS — BP 143/68 | HR 67 | Ht 68.0 in | Wt 196.0 lb

## 2023-10-08 DIAGNOSIS — I350 Nonrheumatic aortic (valve) stenosis: Secondary | ICD-10-CM | POA: Diagnosis present

## 2023-10-08 NOTE — Progress Notes (Signed)
 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  The patient has been followed for heart murmur for many years.  He has known history of CAD and suffered an out-of-hospital cardiac arrest in the setting of acute anterior MI about 10 years ago.  He was treated with primary PCI of the LAD with good recovery of LV function.  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  He is now retired.  The patient is physically active and travels regularly.  He has developed symptoms of exertional chest tightness and dyspnea especially with walking up a hill or walking at a fast pace.  He has no symptoms at rest or with normal activities such as ADLs.  He specifically denies lightheadedness, syncope, edema, orthopnea, or PND.  He is had no heart palpitations.  The patient had an updated echocardiogram on July 16 that demonstrated findings consistent with severe aortic stenosis, specifically with a calculated valve area of less than 0.9 cm, mean transvalvular gradient 38 mmHg, and dimensionless index of 0.24.  Cardiac catheterization performed on July 21 showed nonobstructive plaquing in the left main, left circumflex, and RCA as well as a widely patent stent in the proximal to mid LAD.  He was found to have no obstructive CAD.  He now presents to discuss treatment options for his severe, symptomatic aortic stenosis.  Past Medical History:  Diagnosis Date   Acute MI anterior wall subsequent episode care (HCC)    2014   Anxiety    Borderline diabetes    CAD (coronary artery disease)    Dyslipidemia     Dysrhythmia 2014   V-FIB   GERD (gastroesophageal reflux disease)    Heart murmur    Pre-diabetes    Sciatica    Thyroid  nodule    normal thyroid  function per patient    Past Surgical History:  Procedure Laterality Date   BIOPSY THYROID      pt reports negative   CARDIAC CATHETERIZATION     CORONARY ANGIOPLASTY WITH STENT PLACEMENT  2014   2 STENTS IN LAD   ESOPHAGOGASTRODUODENOSCOPY (EGD) WITH PROPOFOL  N/A 12/19/2020   Procedure: ESOPHAGOGASTRODUODENOSCOPY (EGD) WITH PROPOFOL ;  Surgeon: Dessa Reyes ORN, MD;  Location: [facility] ENDOSCOPY;  Service: Endoscopy;  Laterality: N/A;  1ST CASE   INGUINAL HERNIA REPAIR Bilateral 06/08/2023   Procedure: LAPAROSCOPIC BILATERAL INGUINAL HERNIA REPAIR WITH MESH;  Surgeon: Rubin Calamity, MD;  Location: [facility] OR;  Service: General;  Laterality: Bilateral;   KNEE ARTHROSCOPY Right 12/29/2014   Procedure: ARTHROSCOPY KNEE partial medial menisectomy;  Surgeon: Lynwood SHAUNNA Hue, MD;  Location: [facility] ORS;  Service: Orthopedics;  Laterality: Right;   KNEE ARTHROSCOPY Left 05/09/2016   Procedure: ARTHROSCOPY KNEE, PARTIAL MEDIAL MENISECTOMY;  Surgeon: Lynwood SHAUNNA Hue, MD;  Location: [facility] ORS;  Service: Orthopedics;  Laterality: Left;   LEFT HEART CATH AND CORONARY ANGIOGRAPHY N/A 07/30/2018   Procedure: LEFT HEART CATH AND CORONARY ANGIOGRAPHY;  Surgeon: Verlin Lonni BIRCH, MD;  Location: [facility] INVASIVE CV LAB;  Service: Cardiovascular;  Laterality: N/A;  LEFT HEART CATHETERIZATION WITH CORONARY ANGIOGRAM N/A 04/22/2012   Procedure: LEFT HEART CATHETERIZATION WITH CORONARY ANGIOGRAM;  Surgeon: [name], MD;  Location: [facility] CATH LAB;  Service: Cardiovascular;  Laterality: N/A;   LEFT HEART CATHETERIZATION WITH CORONARY ANGIOGRAM N/A 04/22/2013   Procedure: LEFT HEART CATHETERIZATION WITH CORONARY ANGIOGRAM;  Surgeon: [name], MD;  Location: [facility] CATH LAB;  Service: Cardiovascular;  Laterality: N/A;   MANDIBLE OSTEOTOMY     RIGHT/LEFT HEART CATH AND  CORONARY ANGIOGRAPHY N/A 10/05/2023   Procedure: RIGHT/LEFT HEART CATH AND CORONARY ANGIOGRAPHY;  Surgeon: Swaziland, Peter M, MD;  Location: [facility] INVASIVE CV LAB;  Service: Cardiovascular;  Laterality: N/A;   VASECTOMY      Current Medications: Current Meds  Medication Sig   aspirin  EC 81 MG tablet Take 81 mg by mouth at bedtime.   atorvastatin  (LIPITOR ) 10 MG tablet Take 1 tablet (10 mg total) by mouth daily.   carvedilol  (COREG ) 12.5 MG tablet Take 1 tablet (12.5 mg total) by mouth 2 (two) times daily.   enalapril  (VASOTEC ) 2.5 MG tablet Take 1 tablet (2.5 mg total) by mouth 2 (two) times daily.   fluticasone (FLONASE) 50 MCG/ACT nasal spray Place 1-2 sprays into both nostrils daily as needed for allergies.   omeprazole (PRILOSEC) 40 MG capsule Take 40 mg by mouth at bedtime.   ticagrelor  (BRILINTA ) 60 MG TABS tablet Take 1 tablet (60 mg total) by mouth 2 (two) times daily.     Allergies:   Morphine  and codeine and Sulfa antibiotics   Social History   Socioeconomic History   Marital status: Married    Spouse name: Not on file   Number of children: Not on file   Years of education: Not on file   Highest education level: Not on file  Occupational History   Occupation: MD    Comment: [employer]  Tobacco Use   Smoking status: Former    Types: Cigars    Quit date: 12/26/2012    Years since quitting: 10.7   Smokeless tobacco: Never  Vaping Use   Vaping status: Never Used  Substance and Sexual Activity   Alcohol use: Not Currently    Comment: rarely   Drug use: No   Sexual activity: Not on file  Other Topics Concern   Not on file  Social History Narrative   Not on file   Social Drivers of Health   Financial Resource Strain: Low Risk  (08/06/2023)   Overall Financial Resource Strain (CARDIA)    Difficulty of Paying Living Expenses: Not hard at all  Food Insecurity: Food Insecurity Present (08/06/2023)   Hunger Vital Sign    Worried About Running Out of Food in  the Last Year: Sometimes true    Ran Out of Food in the Last Year: Sometimes true  Transportation Needs: No Transportation Needs (08/06/2023)   PRAPARE - Administrator, Civil Service (Medical): No    Lack of Transportation (Non-Medical): No  Physical Activity: Sufficiently Active (08/06/2023)   Exercise Vital Sign    Days of Exercise per Week: 5 days    Minutes of Exercise per Session: 100 min  Stress: Patient Declined (08/06/2023)   [employer] of Occupational Health - Occupational Stress Questionnaire    Feeling of Stress : Patient declined  Social Connections: Socially Isolated (08/06/2023)   Social Connection and Isolation Panel    Frequency of Communication with Friends and Family: More than three times a week    Frequency  of Social Gatherings with Friends and Family: Three times a week    Attends Religious Services: Never    Active Member of Clubs or Organizations: No    Attends Banker Meetings: Never    Marital Status: Divorced     Family History: The patient's family history includes COPD in his father; Congestive Heart Failure in his father; Diabetes in his father; Hypertension in his father and mother; Stroke in his mother; Thyroid  disease in his mother.  ROS:   Please see the history of present illness.    All other systems reviewed and are negative.  EKGs/Labs/Other Studies Reviewed:    The following studies were reviewed today: Cardiac Studies & Procedures   ______________________________________________________________________________________________ CARDIAC CATHETERIZATION  CARDIAC CATHETERIZATION 10/05/2023  Conclusion   Prox LAD to Mid LAD lesion is 10% stenosed.   Prox RCA lesion is 40% stenosed.   Mid RCA lesion is 20% stenosed.   2nd Mrg lesion is 20% stenosed.   Ost LM lesion is 35% stenosed.   The left ventricular systolic function is normal.   LV end diastolic pressure is mildly elevated.   The left ventricular ejection  fraction is 55-65% by visual estimate.   Hemodynamic findings consistent with mild pulmonary hypertension.   There is moderate aortic valve stenosis.  Nonobstructive CAD Moderate to severe aortic stenosis. Mean AV gradient 31 mm Hg. AVA 1.2 cm squared, index 0.6 Mildly elevated LV filling pressures. LVEDP 17 mm Hg. PCWP 23/28, mean 19 mm Hg Mild pulmonary HTN. PAP 38/17, Mean 30 mm Hg Cardiac output 7.03 L/min, index 3.55  Plan: no significant CAD to explain symptoms. Clinical symptoms appear to be related to Aortic stenosis. Will refer to valve team to consider TAVR  Findings Coronary Findings Diagnostic  Dominance: Right  Left Main Ost LM lesion is 35% stenosed.  Left Anterior Descending Vessel is large. Prox LAD to Mid LAD lesion is 10% stenosed. The lesion was previously treated using a drug eluting stent over 2 years ago.  Left Circumflex Vessel is large.  Second Obtuse Marginal Branch Vessel is large in size. 2nd Mrg lesion is 20% stenosed.  Right Coronary Artery Vessel is large. Prox RCA lesion is 40% stenosed. Mid RCA lesion is 20% stenosed.  Intervention  No interventions have been documented.   CARDIAC CATHETERIZATION  CARDIAC CATHETERIZATION 07/30/2018  Conclusion  Prox RCA lesion is 40% stenosed.  Mid RCA lesion is 20% stenosed.  2nd Mrg lesion is 20% stenosed.  Prox LAD to Mid LAD lesion is 10% stenosed.  The left ventricular systolic function is normal.  LV end diastolic pressure is normal.  The left ventricular ejection fraction is greater than 65% by visual estimate.  There is no mitral valve regurgitation.  1. Patent stent proximal and mid LAD with minimal restenosis. 2. Mild non-obstructive disease in the Circumflex system 3. Moderate non-obstructive disease in the mid RCA. This does not appear to be flow limiting. 4. Preserved LV systolic function with apical hypokinesis  Recommendations: Medical management of  CAD  Findings Coronary Findings Diagnostic  Dominance: Right  Left Anterior Descending Vessel is large. Prox LAD to Mid LAD lesion is 10% stenosed. The lesion was previously treated using a drug eluting stent over 2 years ago.  Left Circumflex Vessel is large.  Second Obtuse Marginal Branch Vessel is large in size. 2nd Mrg lesion is 20% stenosed.  Right Coronary Artery Vessel is large. Prox RCA lesion is 40% stenosed. Mid RCA lesion is 20% stenosed.  Intervention  No interventions have been documented.   STRESS TESTS  PCV WALKING MYOCARDIAL PERFUSION WITH LEXISCAN  01/19/2020   ECHOCARDIOGRAM  ECHOCARDIOGRAM COMPLETE 09/30/2023  Narrative ECHOCARDIOGRAM REPORT    Patient Name:   DR. Meshulem J Aubry Date of Exam: 09/30/2023 Medical Rec #:  [id number]             Height:       68.0 in Accession #:    [id number]            Weight:       197.2 lb Date of Birth:  1950-07-06            BSA:          2.032 m Patient Age:    72 years              BP:           134/61 mmHg Patient Gender: M                     HR:           67 bpm. Exam Location:  [address]  Procedure: 2D Echo, 3D Echo, Cardiac Doppler, Color Doppler and Strain Analysis (Both Spectral and Color Flow Doppler were utilized during procedure).  Indications:    I35.0 Aortic Stenosis  History:        Patient has prior history of Echocardiogram examinations, most recent 06/05/2022. CAD and STEMI, Arrythmias:Ventricular Fibrillation, Signs/Symptoms:Chest Pain, Shortness of Breath and Dyspnea; Risk Factors:Dyslipidemia.  Sonographer:    Waldo Guadalajara RCS Referring Phys: [phone] [name]  IMPRESSIONS   1. Progression of AV disease since prior echocardiogram. DVI 0.24, suspect borderline severe. The aortic valve is calcified. There is severe calcifcation of the aortic valve. There is moderate thickening of the aortic valve. Aortic valve regurgitation is mild. Moderate to severe aortic valve stenosis.  Aortic valve area, by VTI measures 0.89 cm. Aortic valve mean gradient measures 38.0 mmHg. Aortic valve Vmax measures 3.78 m/s. 2. Left ventricular ejection fraction, by estimation, is 60 to 65%. Left ventricular ejection fraction by 3D volume is 70 %. The left ventricle has normal function. The left ventricle has no regional wall motion abnormalities. Left ventricular diastolic parameters were normal. The average left ventricular global longitudinal strain is -20.3 %. The global longitudinal strain is normal. 3. Right ventricular systolic function is normal. The right ventricular size is normal. There is normal pulmonary artery systolic pressure. 4. The mitral valve is normal in structure. Trivial mitral valve regurgitation. No evidence of mitral stenosis. 5. The inferior vena cava is normal in size with greater than 50% respiratory variability, suggesting right atrial pressure of 3 mmHg.  FINDINGS Left Ventricle: Left ventricular ejection fraction, by estimation, is 60 to 65%. Left ventricular ejection fraction by 3D volume is 70 %. The left ventricle has normal function. The left ventricle has no regional wall motion abnormalities. The average left ventricular global longitudinal strain is -20.3 %. Strain was performed and the global longitudinal strain is normal. The left ventricular internal cavity size was normal in size. There is no left ventricular hypertrophy. Left ventricular diastolic parameters were normal.  Right Ventricle: The right ventricular size is normal. No increase in right ventricular wall thickness. Right ventricular systolic function is normal. There is normal pulmonary artery systolic pressure. The tricuspid regurgitant velocity is 2.05 m/s, and with an assumed right atrial pressure of 3 mmHg, the estimated right ventricular systolic pressure is 19.8  mmHg.  Left Atrium: Left atrial size was normal in size.  Right Atrium: Right atrial size was normal in size.  Pericardium:  Trivial pericardial effusion is present.  Mitral Valve: The mitral valve is normal in structure. Trivial mitral valve regurgitation. No evidence of mitral valve stenosis.  Tricuspid Valve: The tricuspid valve is normal in structure. Tricuspid valve regurgitation is mild . No evidence of tricuspid stenosis.  Aortic Valve: Progression of AV disease since prior echocardiogram. DVI 0.24, suspect borderline severe. The aortic valve is calcified. There is severe calcifcation of the aortic valve. There is moderate thickening of the aortic valve. Aortic valve regurgitation is mild. Aortic regurgitation PHT measures 306 msec. Moderate to severe aortic stenosis is present. Aortic valve mean gradient measures 38.0 mmHg. Aortic valve peak gradient measures 57.2 mmHg. Aortic valve area, by VTI measures 0.89 cm.  Pulmonic Valve: The pulmonic valve was normal in structure. Pulmonic valve regurgitation is not visualized. No evidence of pulmonic stenosis.  Aorta: The aortic root is normal in size and structure.  Venous: The inferior vena cava is normal in size with greater than 50% respiratory variability, suggesting right atrial pressure of 3 mmHg.  IAS/Shunts: No atrial level shunt detected by color flow Doppler.  Additional Comments: 3D was performed not requiring image post processing on an independent workstation and was normal.   LEFT VENTRICLE PLAX 2D LVIDd:         4.70 cm         Diastology LVIDs:         2.60 cm         LV e' medial:    9.57 cm/s LV PW:         0.90 cm         LV E/e' medial:  12.7 LV IVS:        1.20 cm         LV e' lateral:   11.20 cm/s LVOT diam:     2.20 cm         LV E/e' lateral: 10.9 LV SV:         93 LV SV Index:   46              2D Longitudinal LVOT Area:     3.80 cm        Strain 2D Strain GLS   -24.2 % (A4C): 2D Strain GLS   -18.2 % (A3C): 2D Strain GLS   -18.5 % (A2C): 2D Strain GLS   -20.3 % Avg:  3D Volume EF LV 3D EF:     Left ventricul ar ejection fraction by 3D volume is 70 %.  3D Volume EF: 3D EF:        70 % LV EDV:       106 ml LV ESV:       32 ml LV SV:        74 ml  RIGHT VENTRICLE RV Basal diam:  3.60 cm RV S prime:     15.80 cm/s TAPSE (M-mode): 2.3 cm RVSP:           19.8 mmHg  LEFT ATRIUM             Index        RIGHT ATRIUM           Index LA diam:        4.30 cm 2.12 cm/m   RA Pressure: 3.00 mmHg LA Vol (A2C):   58.9 ml 28.99  ml/m  RA Area:     10.50 cm LA Vol (A4C):   41.1 ml 20.23 ml/m  RA Volume:   19.90 ml  9.80 ml/m LA Biplane Vol: 51.2 ml 25.20 ml/m AORTIC VALVE AV Area (Vmax):    0.91 cm AV Area (Vmean):   0.85 cm AV Area (VTI):     0.89 cm AV Vmax:           378.00 cm/s AV Vmean:          298.000 cm/s AV VTI:            1.050 m AV Peak Grad:      57.2 mmHg AV Mean Grad:      38.0 mmHg LVOT Vmax:         90.20 cm/s LVOT Vmean:        66.400 cm/s LVOT VTI:          0.245 m LVOT/AV VTI ratio: 0.23 AI PHT:            306 msec  AORTA Ao Root diam: 3.30 cm Ao Asc diam:  3.30 cm  MITRAL VALVE                TRICUSPID VALVE MV Area (PHT):              TR Peak grad:   16.8 mmHg MV Decel Time:              TR Vmax:        205.00 cm/s MV E velocity: 122.00 cm/s  Estimated RAP:  3.00 mmHg MV A velocity: 80.70 cm/s   RVSP:           19.8 mmHg MV E/A ratio:  1.51 SHUNTS Systemic VTI:  0.24 m Systemic Diam: 2.20 cm  Morene Brownie Electronically signed by Morene Brownie Signature Date/Time: 09/30/2023/6:30:21 PM    Final    MONITORS  CARDIAC EVENT MONITOR 11/13/2021       ______________________________________________________________________________________________           Recent Labs: 03/27/2023: TSH 1.28 09/28/2023: ALT 24; BUN 11; Creatinine, Ser 0.76; Platelets 169 10/05/2023: Hemoglobin 13.3; Hemoglobin 12.9; Potassium 4.0; Potassium 4.0; Sodium 141; Sodium 142  Recent Lipid Panel    Component Value Date/Time   CHOL 114 09/28/2023  0816   TRIG 65 09/28/2023 0816   HDL 48 09/28/2023 0816   CHOLHDL 2.4 09/28/2023 0816   CHOLHDL 2.7 04/22/2012 1800   VLDL 16 04/22/2012 1800   LDLCALC 52 09/28/2023 0816          Physical Exam:    VS:  BP (!) 143/68 (BP Location: Right Arm)   Pulse 67   Ht 5' 8 (1.727 m)   Wt 196 lb (88.9 kg)   SpO2 96%   BMI 29.80 kg/m     Wt Readings from Last 3 Encounters:  10/08/23 196 lb (88.9 kg)  10/05/23 185 lb (83.9 kg)  09/25/23 197 lb 3.2 oz (89.4 kg)     GEN:  Well nourished, well developed in no acute distress HEENT: Normal NECK: No JVD; No carotid bruits LYMPHATICS: No lymphadenopathy CARDIAC: RRR, 2/6 harsh late peaking crescendo decrescendo murmur at the right upper sternal border RESPIRATORY:  Clear to auscultation without rales, wheezing or rhonchi  ABDOMEN: Soft, non-tender, non-distended MUSCULOSKELETAL:  No edema; No deformity  SKIN: Warm and dry NEUROLOGIC:  Alert and oriented x 3 PSYCHIATRIC:  Normal affect   ASSESSMENT:    1. Nonrheumatic aortic valve stenosis  PLAN:    In order of problems listed above:  Patient with severe, stage D1 aortic stenosis.  He describes [place]  Heart Association/CCS functional class II symptoms of exertional dyspnea and chest tightness.  He has become quite symptomatic with any vigorous physical activity.  When he did a lot of walking in [place] on a recent vacation he was limited by chest tightness and shortness of breath.  This primarily occurred when walking uphill.  He has no resting symptoms or symptoms with his activities of daily living.  His physical exam and echo findings are both consistent with severe aortic stenosis.  The patient's echo is personally reviewed and shows an LVEF of 60 to 65%, mean transaortic gradient of 38 mmHg, calculated aortic valve area of 0.89 cm, and dimensionless index of 0.24.  Fortunately, he has normal LV and RV function and no concomitant valvular disease.  His cardiac catheterization films  are reviewed and he has mild nonobstructive plaquing in the right coronary artery, patency of the left main and left circumflex, and patency of the long stented segment in the LAD with no obstructive CAD. I have reviewed the natural history of aortic stenosis with the patient today. We have discussed the limitations of medical therapy and the poor prognosis associated with symptomatic aortic stenosis. We have reviewed potential treatment options, including palliative medical therapy, conventional surgical aortic valve replacement, and transcatheter aortic valve replacement. We discussed treatment options in the context of the patient's specific comorbid medical conditions.  I specifically contrasted the pros and cons of transcatheter versus surgical aortic valve replacement with the patient.  I demonstrated a TAVR procedural animation and reviewed the steps of the procedure, potential complications, expected recovery, and reviewed the data regarding TAVR valve longevity.  The patient is scheduled to undergo a gated CTA of the heart (TAVR protocol) as well as a CTA of the chest, abdomen, and pelvis.  If he has favorable anatomy for TAVR, he would be an appropriate candidate and he strongly favors TAVR if possible.  However, if he has poor anatomy for TAVR, I suspect he would be a good candidate for surgical aortic valve replacement as he is in good overall health despite his history of coronary artery disease and myocardial infarction.  Patient has good functional capacity and no other major comorbidities at this point.  Once his CTA studies are completed, he will be referred for cardiac surgical evaluation and his case will be reviewed with our multidisciplinary heart valve team.           Medication Adjustments/Labs and Tests Ordered: Current medicines are reviewed at length with the patient today.  Concerns regarding medicines are outlined above.  No orders of the defined types were placed in this  encounter.  No orders of the defined types were placed in this encounter.   There are no Patient Instructions on file for this visit.   Signed, Ozell Fell, MD  10/08/2023 5:44 PM    [facility]

## 2023-10-12 ENCOUNTER — Ambulatory Visit (HOSPITAL_COMMUNITY)
Admit: 2023-10-12 | Discharge: 2023-10-12 | Disposition: A | Discharge status: Home / Self Care | Attending: Cardiology | Admitting: Cardiology

## 2023-10-12 DIAGNOSIS — I700 Atherosclerosis of aorta: Secondary | ICD-10-CM | POA: Diagnosis not present

## 2023-10-12 DIAGNOSIS — I517 Cardiomegaly: Secondary | ICD-10-CM | POA: Insufficient documentation

## 2023-10-12 DIAGNOSIS — I350 Nonrheumatic aortic (valve) stenosis: Secondary | ICD-10-CM | POA: Diagnosis not present

## 2023-10-12 DIAGNOSIS — N400 Enlarged prostate without lower urinary tract symptoms: Secondary | ICD-10-CM | POA: Diagnosis not present

## 2023-10-12 DIAGNOSIS — I2510 Atherosclerotic heart disease of native coronary artery without angina pectoris: Secondary | ICD-10-CM | POA: Diagnosis not present

## 2023-10-12 DIAGNOSIS — K760 Fatty (change of) liver, not elsewhere classified: Secondary | ICD-10-CM | POA: Diagnosis not present

## 2023-10-12 DIAGNOSIS — K8020 Calculus of gallbladder without cholecystitis without obstruction: Secondary | ICD-10-CM | POA: Diagnosis not present

## 2023-10-12 DIAGNOSIS — Z955 Presence of coronary angioplasty implant and graft: Secondary | ICD-10-CM | POA: Diagnosis not present

## 2023-10-12 MED ORDER — IOHEXOL 350 MG/ML SOLN
100.0000 mL | Freq: Once | INTRAVENOUS | Status: AC | PRN
  Administered 2023-10-12: 100 mL via INTRAVENOUS

## 2023-10-13 ENCOUNTER — Ambulatory Visit: Payer: Self-pay | Admitting: Cardiovascular Disease

## 2023-10-14 NOTE — Progress Notes (Addendum)
 Procedure Type: Isolated AVR Perioperative Outcome Estimate % Operative Mortality 2.9% Morbidity & Mortality 7.1% Stroke 0.697% Renal Failure 1.08% Reoperation 3.36% Prolonged Ventilation 2.56% Deep Sternal Wound Infection 0.05% Long Hospital Stay (>14 days) 3.53% Short Hospital Stay (<6 days)* 56.5%

## 2023-10-27 NOTE — Progress Notes (Signed)
 Patrick Gordon, Dr. 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 He is s/p anterior STEMI associated with Vfib arrest on 04/22/2012. He underwent emergent stenting of the proximal to mid LAD with 2 long drug-eluting stents. Ejection fraction at cath was 25-30%. Echocardiogram demonstrated an ejection fraction 35%.Subsequent evaluation in [place] showed an EF 51% with small apical defect. He was concerned enough about his cardiac status that he underwent repeat cardiac cath on Feb. 6, 2015. This showed nonobstructive CAD with patent stents. EF was 50-55% with apical wall motion abnormality.   In May 2020 he complained of symptoms of dyspnea on exertion and some chest pressure. This led to a cardiac cath showing nonobstructive CAD and good patency of LAD stents. Good LV function with EF 50-55%. EDP was normal.  He wore a Zio patch monitor in Sept 2021 and this showed rare isolated PACs and PVCs with one PVC couplet. Carotid dopplers showed mild nonobstructive disease on the right ICA. Echo showed normal EF with mild AS mean gradient 11 mm Hg.   Since December he noted some chest tightness with walking but only up incline and was able to walk 5 miles. Recently went on a trip to [place]. Gained weight. Not eating well. On his trip and since then has noted more chest tightness and discomfort with exertion at lower level. No rest angina.   Echo showed evidence of severe aortic stenosis. Cardiac cath performed showing nonobstructive CAD and severe AS. Seen by Dr Wonda and Dr Lucas with plans for TAVR on September 9. He did have a local reaction at his cath site with blistering ? Related to verapamil .     Current Outpatient Medications on File Prior to Visit  Medication Sig Dispense Refill   aspirin  EC 81 MG tablet Take 81 mg by mouth at bedtime.     atorvastatin  (LIPITOR ) 10 MG tablet Take 1  tablet (10 mg total) by mouth daily. 90 tablet 3   carvedilol  (COREG ) 12.5 MG tablet Take 1 tablet (12.5 mg total) by mouth 2 (two) times daily. 180 tablet 3   enalapril  (VASOTEC ) 2.5 MG tablet Take 1 tablet (2.5 mg total) by mouth 2 (two) times daily. 180 tablet 3   fluticasone (FLONASE) 50 MCG/ACT nasal spray Place 1-2 sprays into both nostrils daily as needed for allergies.     omeprazole (PRILOSEC) 40 MG capsule Take 40 mg by mouth at bedtime.     ticagrelor  (BRILINTA ) 60 MG TABS tablet Take 1 tablet (60 mg total) by mouth 2 (two) times daily. 180 tablet 3   MOUNJARO 10 MG/0.5ML Pen Inject 10 mg into the skin once a week. (Patient not taking: Reported on 11/09/2023)     No current facility-administered medications on file prior to visit.    Allergies  Allergen Reactions   Morphine  And Codeine Nausea And Vomiting   Sulfa Antibiotics Rash    FIXED DRUG ERUPTION   Verapamil  Rash    Blisters at cath site    Past Medical History:  Diagnosis Date   Acute MI anterior wall subsequent episode care [facility])    2014   Anxiety    Borderline diabetes    CAD (coronary artery disease)    Dyslipidemia    Dysrhythmia 2014   V-FIB   GERD (gastroesophageal reflux disease)    Heart murmur    Pre-diabetes    Sciatica    Thyroid   nodule    normal thyroid  function per patient    Past Surgical History:  Procedure Laterality Date   BIOPSY THYROID      pt reports negative   CARDIAC CATHETERIZATION     CORONARY ANGIOPLASTY WITH STENT PLACEMENT  2014   2 STENTS IN LAD   ESOPHAGOGASTRODUODENOSCOPY (EGD) WITH PROPOFOL  N/A 12/19/2020   Procedure: ESOPHAGOGASTRODUODENOSCOPY (EGD) WITH PROPOFOL ;  Surgeon: Dessa Reyes ORN, MD;  Location: [facility] ENDOSCOPY;  Service: Endoscopy;  Laterality: N/A;  1ST CASE   INGUINAL HERNIA REPAIR Bilateral 06/08/2023   Procedure: LAPAROSCOPIC BILATERAL INGUINAL HERNIA REPAIR WITH MESH;  Surgeon: Rubin Calamity, MD;  Location: [facility] OR;  Service: General;  Laterality:  Bilateral;   KNEE ARTHROSCOPY Right 12/29/2014   Procedure: ARTHROSCOPY KNEE partial medial menisectomy;  Surgeon: Lynwood SHAUNNA Hue, MD;  Location: [facility] ORS;  Service: Orthopedics;  Laterality: Right;   KNEE ARTHROSCOPY Left 05/09/2016   Procedure: ARTHROSCOPY KNEE, PARTIAL MEDIAL MENISECTOMY;  Surgeon: Lynwood SHAUNNA Hue, MD;  Location: [facility] ORS;  Service: Orthopedics;  Laterality: Left;   LEFT HEART CATH AND CORONARY ANGIOGRAPHY N/A 07/30/2018   Procedure: LEFT HEART CATH AND CORONARY ANGIOGRAPHY;  Surgeon: Verlin Lonni BIRCH, MD;  Location: [facility] INVASIVE CV LAB;  Service: Cardiovascular;  Laterality: N/A;   LEFT HEART CATHETERIZATION WITH CORONARY ANGIOGRAM N/A 04/22/2012   Procedure: LEFT HEART CATHETERIZATION WITH CORONARY ANGIOGRAM;  Surgeon: Deakon Frix M [place], MD;  Location: [facility] CATH LAB;  Service: Cardiovascular;  Laterality: N/A;   LEFT HEART CATHETERIZATION WITH CORONARY ANGIOGRAM N/A 04/22/2013   Procedure: LEFT HEART CATHETERIZATION WITH CORONARY ANGIOGRAM;  Surgeon: Yong Wahlquist M [place], MD;  Location: [facility] CATH LAB;  Service: Cardiovascular;  Laterality: N/A;   MANDIBLE OSTEOTOMY     RIGHT/LEFT HEART CATH AND CORONARY ANGIOGRAPHY N/A 10/05/2023   Procedure: RIGHT/LEFT HEART CATH AND CORONARY ANGIOGRAPHY;  Surgeon: [place], Brecklynn Jian M, MD;  Location: [facility] INVASIVE CV LAB;  Service: Cardiovascular;  Laterality: N/A;   VASECTOMY      Social History   Tobacco Use  Smoking Status Former   Types: Cigars   Quit date: 12/26/2012   Years since quitting: 10.8  Smokeless Tobacco Never    Social History   Substance and Sexual Activity  Alcohol Use Not Currently   Comment: rarely    Family History  Problem Relation Age of Onset   Hypertension Mother    Stroke Mother    Thyroid  disease Mother        thyroidectomy   COPD Father    Diabetes Father    Hypertension Father    Congestive Heart Failure Father     Review of Systems: As noted in history of present illness.   All other systems were  reviewed and are negative.  Physical Exam: BP 122/84 (BP Location: Left Arm, Patient Position: Sitting, Cuff Size: Large)   Pulse 73   Ht 5' 8 (1.727 m)   Wt 183 lb 6.4 oz (83.2 kg)   SpO2 98%   BMI 27.89 kg/m  GENERAL:  Well appearing male in NAD HEENT:  PERRL, EOMI, sclera are clear. Oropharynx is clear. NECK:  No jugular venous distention, carotid upstroke brisk and symmetric, bilateral  bruits, no thyromegaly or adenopathy LUNGS:  Clear to auscultation bilaterally CHEST:  Unremarkable HEART:  RRR,  PMI not displaced or sustained,S1 and S2 within normal limits, no S3, no S4: no clicks, no rubs, harsh gr 2/6 systolic murmur at the apex/RUSB ABD:  Soft, nontender. BS +, no masses or bruits. No hepatomegaly, no splenomegaly  EXT:  2 + pulses throughout, no edema, no cyanosis no clubbing SKIN:  Warm and dry.  No rashes NEURO:  Alert and oriented x 3. Cranial nerves II through XII intact. PSYCH:  Cognitively intact    LABORATORY DATA: Labs reviewed from 02/11/16: Normal CMET. gluose 139. cholesterol 105, trig 150, HDL 43, LDL 32. CBC normal. A1c 5.5%. Normal thyroid  studies. Dated 05/26/17: Normal CMET. Cholesterol 108, triglycerides 77, HDL 49, LDL 44. TFTs normal. CBC normal. A1c 5%. CRP 0.59. Dated 04/05/18: glucose 121, otherwise chemistries normal. Cholesterol 120, triglycerides 180, HDL 36, LDL 48. TSH and CBC normal. A1c 5.7%.  Dated 07/09/18: glucose 136, otherwise CMET normal. Uric acid normal. Cholesterol 99, triglycerides 76, HDL 40, LDL 44. CBC and TSH normal. A1c 5.4%.  Dated 07/20/19: glucose 105. Otherwise CMET normal. Cholesterol 71, triglycerides 72, HDL 31, LDL 24. TFTS and CBC normal. A1c 5.4%.  Dated 05/22/21: cholesterol 103, triglycerides 65, HDL 42, LDL 47. Glucose 113, A1c 5.4%. CMET and CBC normal. TFTs normal.  Dated 07/24/22: glucose 118, uric acid 3.7. CMET normal. Cholesterol 96, triglycerides 77, HDL 36, LDL 44. TFTs and CBC normal. A1c 5.3%. CRP 0.28.        Echo done 05/26/17: normal LV function. Mild LVH. LAE. No valvular disease.   Cardiac cath 07/30/18:  LEFT HEART CATH AND CORONARY ANGIOGRAPHY  Conclusion      Prox RCA lesion is 40% stenosed. Mid RCA lesion is 20% stenosed. 2nd Mrg lesion is 20% stenosed. Prox LAD to Mid LAD lesion is 10% stenosed. The left ventricular systolic function is normal. LV end diastolic pressure is normal. The left ventricular ejection fraction is greater than 65% by visual estimate. There is no mitral valve regurgitation.   1. Patent stent proximal and mid LAD with minimal restenosis.  2. Mild non-obstructive disease in the Circumflex system 3. Moderate non-obstructive disease in the mid RCA. This does not appear to be flow limiting.  4. Preserved LV systolic function with apical hypokinesis   Recommendations: Medical management of CAD      Echo 09/30/23: IMPRESSIONS     1. Progression of AV disease since prior echocardiogram. DVI 0.24,  suspect borderline severe. The aortic valve is calcified. There is severe  calcifcation of the aortic valve. There is moderate thickening of the  aortic valve. Aortic valve regurgitation  is mild. Moderate to severe aortic valve stenosis. Aortic valve area, by  VTI measures 0.89 cm. Aortic valve mean gradient measures 38.0 mmHg.  Aortic valve Vmax measures 3.78 m/s.   2. Left ventricular ejection fraction, by estimation, is 60 to 65%. Left  ventricular ejection fraction by 3D volume is 70 %. The left ventricle has  normal function. The left ventricle has no regional wall motion  abnormalities. Left ventricular diastolic   parameters were normal. The average left ventricular global longitudinal  strain is -20.3 %. The global longitudinal strain is normal.   3. Right ventricular systolic function is normal. The right ventricular  size is normal. There is normal pulmonary artery systolic pressure.   4. The mitral valve is normal in structure. Trivial mitral  valve  regurgitation. No evidence of mitral stenosis.   5. The inferior vena cava is normal in size with greater than 50%  respiratory variability, suggesting right atrial pressure of 3 mmHg.    Cardiac cath 10/05/23:  RIGHT/LEFT HEART CATH AND CORONARY ANGIOGRAPHY   Conclusion      Prox LAD to Mid LAD lesion is 10% stenosed.  Prox RCA lesion is 40% stenosed.   Mid RCA lesion is 20% stenosed.   2nd Mrg lesion is 20% stenosed.   Ost LM lesion is 35% stenosed.   The left ventricular systolic function is normal.   LV end diastolic pressure is mildly elevated.   The left ventricular ejection fraction is 55-65% by visual estimate.   Hemodynamic findings consistent with mild pulmonary hypertension.   There is moderate aortic valve stenosis.   Nonobstructive CAD Moderate to severe aortic stenosis. Mean AV gradient 31 mm Hg. AVA 1.2 cm squared, index 0.6 Mildly elevated LV filling pressures. LVEDP 17 mm Hg. PCWP 23/28, mean 19 mm Hg Mild pulmonary HTN. PAP 38/17, Mean 30 mm Hg Cardiac output 7.03 L/min, index 3.55   Plan: no significant CAD to explain symptoms. Clinical symptoms appear to be related to Aortic stenosis. Will refer to valve team to consider TAVR     Assessment / Plan: 1. CAD with remote Anterior STEMI complicated by ventricular fibrillation arrest in 2014. Status post DES x2 to the proximal and mid LAD in February 2014. Repeat cardiac cath in May 2020 showed nonobstructive disease and EF improved.   On long term DAPT with reduced dose Brilinta  indefinitely as long as no bleeding problems. Nonobstructive CAD on cath.    2. Ischemic cardiomyopathy. Initial Ejection fraction 25-35% with MI. Now improved to normal based on last Echo. Cardiac cath in May 2020 showed EF 55% on recent Echo. Continue current doses of ACE inhibitor and carvedilol .  3. Dyslipidemia. Continue statin therapy. Dietary modification. Excellent labs. Last LDL at goal.   4. Prediabetes. Encourage   aerobic activity and weight loss. Last A1c 5.8%.   5. Severe AS.  Symptomatic. Plan TAVR per valve team  6. Carotid bruits. Nonobstructive disease by doppler in 2021.

## 2023-11-04 ENCOUNTER — Ambulatory Visit: Attending: Surgery | Admitting: Surgery

## 2023-11-04 ENCOUNTER — Encounter: Payer: Self-pay | Admitting: Surgery

## 2023-11-04 VITALS — BP 133/72 | HR 69 | Resp 18 | Ht 68.0 in

## 2023-11-04 DIAGNOSIS — I350 Nonrheumatic aortic (valve) stenosis: Secondary | ICD-10-CM | POA: Diagnosis present

## 2023-11-04 NOTE — Progress Notes (Unsigned)
 Patient ID: [name], Dr., male   DOB: 12/11/1950, 73 y.o.   MRN: [id number]  [facility]       [address]       [place] [address]             [phone]          CARDIOTHORACIC SURGERY CONSULTATION REPORT  PCP is Corwin Antu, FNP Referring Provider is Dr. Ozell Fell Primary Cardiologist is Dr. 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  He had an outside echo in March 2024 showing a mean aortic valve gradient of 15 mmHg.  He just returned from a several month trip touring [place] and during that trip he noticed increased exertional shortness of breath and fatigue particularly with doing a lot of walking up hills.  This was associated with some exertional substernal chest tightness.  He denies any dizziness or syncope.  He has had no orthopnea or peripheral edema.  A recent echo in July 2025 showed a calcified aortic valve with a mean gradient of 38 mmHg and a dimensionless index of 0.24 with a calculated valve area of 0.89 cm.  Stroke-volume index was 46 with a left ventricular ejection fraction of 60 to 65%.  He reports that since returning home he lost about 10 pounds out of the 20 pounds that he gained on his trip and feels much better.  He has been walking 4 to 5 miles per day.  He feels like his symptoms have significantly improved since losing weight.  Past Medical History:  Diagnosis Date   Acute MI anterior wall subsequent episode care (HCC)    2014   Anxiety    Borderline diabetes    CAD (coronary artery disease)    Dyslipidemia    Dysrhythmia 2014    V-FIB   GERD (gastroesophageal reflux disease)    Heart murmur    Pre-diabetes    Sciatica    Thyroid  nodule    normal thyroid  function per patient    Past Surgical History:  Procedure Laterality Date   BIOPSY THYROID      pt reports negative   CARDIAC CATHETERIZATION     CORONARY ANGIOPLASTY WITH STENT PLACEMENT  2014   2 STENTS IN LAD   ESOPHAGOGASTRODUODENOSCOPY (EGD) WITH PROPOFOL  N/A 12/19/2020   Procedure: ESOPHAGOGASTRODUODENOSCOPY (EGD) WITH PROPOFOL ;  Surgeon: Dessa Reyes ORN, MD;  Location: [facility] ENDOSCOPY;  Service: Endoscopy;  Laterality: N/A;  1ST CASE   INGUINAL HERNIA REPAIR Bilateral 06/08/2023   Procedure: LAPAROSCOPIC BILATERAL INGUINAL HERNIA REPAIR WITH MESH;  Surgeon: Rubin Calamity, MD;  Location: [facility] OR;  Service: General;  Laterality: Bilateral;   KNEE ARTHROSCOPY Right 12/29/2014   Procedure: ARTHROSCOPY KNEE partial medial menisectomy;  Surgeon: Lynwood SHAUNNA Hue, MD;  Location: [facility] ORS;  Service: Orthopedics;  Laterality: Right;   KNEE ARTHROSCOPY Left 05/09/2016   Procedure: ARTHROSCOPY KNEE, PARTIAL MEDIAL MENISECTOMY;  Surgeon: Lynwood SHAUNNA Hue, MD;  Location: [facility] ORS;  Service: Orthopedics;  Laterality: Left;   LEFT HEART CATH AND CORONARY ANGIOGRAPHY N/A 07/30/2018   Procedure: LEFT HEART CATH AND CORONARY ANGIOGRAPHY;  Surgeon: Verlin Lonni BIRCH, MD;  Location: [facility] INVASIVE  CV LAB;  Service: Cardiovascular;  Laterality: N/A;   LEFT HEART CATHETERIZATION WITH CORONARY ANGIOGRAM N/A 04/22/2012   Procedure: LEFT HEART CATHETERIZATION WITH CORONARY ANGIOGRAM;  Surgeon: [name], MD;  Location: [facility] CATH LAB;  Service: Cardiovascular;  Laterality: N/A;   LEFT HEART CATHETERIZATION WITH CORONARY ANGIOGRAM N/A 04/22/2013   Procedure: LEFT HEART CATHETERIZATION WITH CORONARY ANGIOGRAM;  Surgeon: [name], MD;  Location: [facility] CATH LAB;  Service: Cardiovascular;  Laterality: N/A;   MANDIBLE OSTEOTOMY     RIGHT/LEFT HEART CATH AND CORONARY ANGIOGRAPHY  N/A 10/05/2023   Procedure: RIGHT/LEFT HEART CATH AND CORONARY ANGIOGRAPHY;  Surgeon: Swaziland, Peter M, MD;  Location: [facility] INVASIVE CV LAB;  Service: Cardiovascular;  Laterality: N/A;   VASECTOMY      Family History  Problem Relation Age of Onset   Hypertension Mother    Stroke Mother    Thyroid  disease Mother        thyroidectomy   COPD Father    Diabetes Father    Hypertension Father    Congestive Heart Failure Father     Social History   Socioeconomic History   Marital status: Married    Spouse name: Not on file   Number of children: Not on file   Years of education: Not on file   Highest education level: Not on file  Occupational History   Occupation: MD    Comment: [employer]  Tobacco Use   Smoking status: Former    Types: Cigars    Quit date: 12/26/2012    Years since quitting: 10.8   Smokeless tobacco: Never  Vaping Use   Vaping status: Never Used  Substance and Sexual Activity   Alcohol use: Not Currently    Comment: rarely   Drug use: No   Sexual activity: Not on file  Other Topics Concern   Not on file  Social History Narrative   Not on file   Social Drivers of Health   Financial Resource Strain: Low Risk  (08/06/2023)   Overall Financial Resource Strain (CARDIA)    Difficulty of Paying Living Expenses: Not hard at all  Food Insecurity: Food Insecurity Present (08/06/2023)   Hunger Vital Sign    Worried About Running Out of Food in the Last Year: Sometimes true    Ran Out of Food in the Last Year: Sometimes true  Transportation Needs: No Transportation Needs (08/06/2023)   PRAPARE - Administrator, Civil Service (Medical): No    Lack of Transportation (Non-Medical): No  Physical Activity: Sufficiently Active (08/06/2023)   Exercise Vital Sign    Days of Exercise per Week: 5 days    Minutes of Exercise per Session: 100 min  Stress: Patient Declined (08/06/2023)   [employer] of Occupational Health - Occupational Stress  Questionnaire    Feeling of Stress : Patient declined  Social Connections: Socially Isolated (08/06/2023)   Social Connection and Isolation Panel    Frequency of Communication with Friends and Family: More than three times a week    Frequency of Social Gatherings with Friends and Family: Three times a week    Attends Religious Services: Never    Active Member of Clubs or Organizations: No    Attends Banker Meetings: Never    Marital Status: Divorced  Catering manager Violence: Not At Risk (08/06/2023)   Humiliation, Afraid, Rape, and Kick questionnaire    Fear of Current or Ex-Partner: No    Emotionally Abused: No  Physically Abused: No    Sexually Abused: No    Prior to Admission medications   Medication Sig Start Date End Date Taking? Authorizing Provider  aspirin  EC 81 MG tablet Take 81 mg by mouth at bedtime.   Yes [provider]  atorvastatin  (LIPITOR ) 10 MG tablet Take 1 tablet (10 mg total) by mouth daily. 03/23/15  Yes Swazil[place], Peter M, MD  carvedilol  (COREG ) 12.5 MG tablet Take 1 tablet (12.5 mg total) by mouth 2 (two) times daily. 03/23/15  Yes [place], Peter M, MD  enalapril  (VASOTEC ) 2.5 MG tablet Take 1 tablet (2.5 mg total) by mouth 2 (two) times daily. 03/23/15  Yes [place], Peter M, MD  fluticasone (FLONASE) 50 MCG/ACT nasal spray Place 1-2 sprays into both nostrils daily as needed for allergies. 09/04/23  Yes [provider]  omeprazole (PRILOSEC) 40 MG capsule Take 40 mg by mouth at bedtime.   Yes [provider]  ticagrelor  (BRILINTA ) 60 MG TABS tablet Take 1 tablet (60 mg total) by mouth 2 (two) times daily. 03/23/15  Yes [place]az[place], Peter M, MD    Current Outpatient Medications  Medication Sig Dispense Refill   aspirin  EC 81 MG tablet Take 81 mg by mouth at bedtime.     atorvastatin  (LIPITOR ) 10 MG tablet Take 1 tablet (10 mg total) by mouth daily. 90 tablet 3   carvedilol  (COREG ) 12.5 MG tablet Take 1 tablet (12.5 mg total) by mouth 2  (two) times daily. 180 tablet 3   enalapril  (VASOTEC ) 2.5 MG tablet Take 1 tablet (2.5 mg total) by mouth 2 (two) times daily. 180 tablet 3   fluticasone (FLONASE) 50 MCG/ACT nasal spray Place 1-2 sprays into both nostrils daily as needed for allergies.     omeprazole (PRILOSEC) 40 MG capsule Take 40 mg by mouth at bedtime.     ticagrelor  (BRILINTA ) 60 MG TABS tablet Take 1 tablet (60 mg total) by mouth 2 (two) times daily. 180 tablet 3   No current facility-administered medications for this visit.    Allergies  Allergen Reactions   Morphine  And Codeine Nausea And Vomiting   Sulfa Antibiotics Rash    FIXED DRUG ERUPTION      Review of Systems:   General:  normal appetite, normal energy, no weight gain, + weight loss, no fever  Cardiac:  no chest pain with exertion, no chest pain at rest, + SOB with moderate exertion, no resting SOB, no PND, no orthopnea, no palpitations, no arrhythmia, no atrial fibrillation, no LE edema, no dizzy spells, no syncope  Respiratory:  + exertional shortness of breath, no home oxygen, no productive cough, no dry cough, no bronchitis, no wheezing, no hemoptysis, no asthma, no pain with inspiration or cough, no sleep apnea, no CPAP at night  GI:   no difficulty swallowing, no reflux, + frequent heartburn, + hiatal hernia, no abdominal pain, no constipation, no diarrhea, no hematochezia, no hematemesis, no melena  GU:   no dysuria,  no frequency, no urinary tract infection, no hematuria, no enlarged prostate, no kidney stones, no kidney disease  Vascular:  no pain suggestive of claudication, no pain in feet, no leg cramps, no varicose veins, no DVT, on non-healing foot ulcer  Neuro:   no stroke, on TIA's, on seizures, no headaches, no temporary blindness one eye,  no slurred speech, no peripheral neuropathy, no chronic pain, no instability of gait, no memory/cognitive dysfunction  Musculoskeletal: + arthritis - primarily involving the right knee, no joint  swelling, no  myalgias, no difficulty walking, normal mobility   Skin:   no rash, no itching, no skin infections, no pressure sores or ulcerations  Psych:   no anxiety, no depression, no nervousness, no unusual recent stress  Eyes:   no blurry vision, no floaters, no recent vision changes, no glasses or contacts  ENT:   no hearing loss, no loose or painful teeth, no dentures, last saw dentist this year  Hematologic:  no easy bruising, no abnormal bleeding, no clotting disorder, no frequent epistaxis  Endocrine:  no diabetes, does not check CBG's at home     Physical Exam:   BP 133/72   Pulse 69   Resp 18   Ht 5' 8 (1.727 m)   SpO2 98%   BMI 29.80 kg/m   General:  well-appearing  HEENT:  Unremarkable, NCAT, PERLA, EOMI  Neck:   no JVD, no bruits, no adenopathy   Chest:   clear to auscultation, symmetrical breath sounds, no wheezes, no rhonchi   CV:   RRR, 3/6 systolic murmur RSB, no diastolic murmur  Abdomen:  soft, non-tender, no masses   Extremities:  warm, well-perfused, pedal pulses palpable, no lower extremity edema  Rectal/GU  Deferred  Neuro:   Grossly non-focal and symmetrical throughout  Skin:   Clean and dry, no rashes, no breakdown  Diagnostic Tests:  ECHOCARDIOGRAM REPORT       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  Exam Location:  [address]   Procedure: 2D Echo, 3D Echo, Cardiac Doppler, Color Doppler and Strain  Analysis            (Both Spectral and Color Flow Doppler were utilized during             procedure).   Indications:    I35.0 Aortic Stenosis    History:        Patient has prior history of Echocardiogram  examinations,  most                 recent 06/05/2022. CAD and STEMI, Arrythmias:Ventricular                  Fibrillation, Signs/Symptoms:Chest Pain, Shortness of  Breath and                  Dyspnea; Risk Factors:Dyslipidemia.    Sonographer:    Waldo Guadalajara RCS  Referring Phys: [phone] [name]   IMPRESSIONS     1. Progression of AV disease since prior echocardiogram. DVI 0.24,  suspect borderline severe. The aortic valve is calcified. There  is severe  calcifcation of the aortic valve. There is moderate thickening of the  aortic valve. Aortic valve regurgitation  is mild. Moderate to severe aortic valve stenosis. Aortic valve area, by  VTI measures 0.89 cm. Aortic valve mean gradient measures 38.0 mmHg.  Aortic valve Vmax measures 3.78 m/s.   2. Left ventricular ejection fraction, by estimation, is 60 to 65%. Left  ventricular ejection fraction by 3D volume is 70 %. The left ventricle has  normal function. The left ventricle has no regional wall motion  abnormalities. Left ventricular diastolic   parameters were normal. The average left ventricular global longitudinal  strain is -20.3 %. The global longitudinal strain is normal.   3. Right ventricular systolic function is normal. The right ventricular  size is normal. There is normal pulmonary artery systolic pressure.   4. The mitral valve is normal in structure. Trivial mitral valve  regurgitation. No evidence of mitral stenosis.   5. The inferior vena cava is normal in size with greater than 50%  respiratory variability, suggesting right atrial pressure of 3 mmHg.   FINDINGS   Left Ventricle: Left ventricular ejection fraction, by estimation, is 60  to 65%. Left ventricular ejection fraction by 3D volume is 70 %. The left  ventricle has normal function. The left ventricle has no regional wall  motion abnormalities. The average  left ventricular global longitudinal strain is -20.3 %. Strain was  performed and the  global longitudinal strain is normal. The left  ventricular internal cavity size was normal in size. There is no left  ventricular hypertrophy. Left ventricular diastolic  parameters were normal.   Right Ventricle: The right ventricular size is normal. No increase in  right ventricular wall thickness. Right ventricular systolic function is  normal. There is normal pulmonary artery systolic pressure. The tricuspid  regurgitant velocity is 2.05 m/s, and   with an assumed right atrial pressure of 3 mmHg, the estimated right  ventricular systolic pressure is 19.8 mmHg.   Left Atrium: Left atrial size was normal in size.   Right Atrium: Right atrial size was normal in size.   Pericardium: Trivial pericardial effusion is present.   Mitral Valve: The mitral valve is normal in structure. Trivial mitral  valve regurgitation. No evidence of mitral valve stenosis.   Tricuspid Valve: The tricuspid valve is normal in structure. Tricuspid  valve regurgitation is mild . No evidence of tricuspid stenosis.   Aortic Valve: Progression of AV disease since prior echocardiogram. DVI  0.24, suspect borderline severe. The aortic valve is calcified. There is  severe calcifcation of the aortic valve. There is moderate thickening of  the aortic valve. Aortic valve  regurgitation is mild. Aortic regurgitation PHT measures 306 msec.  Moderate to severe aortic stenosis is present. Aortic valve mean gradient  measures 38.0 mmHg. Aortic valve peak gradient measures 57.2 mmHg. Aortic  valve area, by VTI measures 0.89 cm.   Pulmonic Valve: The pulmonic valve was normal in structure. Pulmonic valve  regurgitation is not visualized. No evidence of pulmonic stenosis.   Aorta: The aortic root is normal in size and structure.   Venous: The inferior vena cava is normal in size with greater than 50%  respiratory variability, suggesting right atrial pressure of 3 mmHg.   IAS/Shunts: No atrial level shunt detected  by color flow Doppler.   Additional Comments: 3D was performed not requiring image post processing  on an independent workstation and was normal.     LEFT VENTRICLE  PLAX 2D  LVIDd:         4.70 cm         Diastology  LVIDs:         2.60 cm         LV e' medial:    9.57 cm/s  LV PW:         0.90 cm         LV E/e' medial:  12.7  LV IVS:        1.20 cm         LV e' lateral:   11.20 cm/s  LVOT diam:     2.20 cm         LV E/e' lateral: 10.9  LV SV:         93  LV SV Index:   46              2D Longitudinal  LVOT Area:     3.80 cm        Strain                                 2D Strain GLS   -24.2 %                                 (A4C):                                 2D Strain GLS   -18.2 %                                 (A3C):                                 2D Strain GLS   -18.5 %                                 (A2C):                                 2D Strain GLS   -20.3 %                                 Avg:                                   3D Volume EF                                 LV 3D EF:    Left                                              ventricul  ar                                              ejection                                              fraction                                              by 3D                                              volume is                                              70 %.                                   3D Volume EF:                                 3D EF:        70 %                                 LV EDV:       106 ml                                 LV ESV:       32 ml                                 LV SV:        74 ml   RIGHT VENTRICLE  RV Basal diam:  3.60 cm  RV S prime:     15.80 cm/s  TAPSE (M-mode): 2.3 cm  RVSP:           19.8 mmHg   LEFT ATRIUM             Index        RIGHT ATRIUM           Index  LA diam:        4.30 cm 2.12 cm/m   RA Pressure: 3.00 mmHg   LA Vol (A2C):   58.9 ml 28.99 ml/m  RA Area:     10.50 cm  LA Vol (A4C):   41.1 ml 20.23 ml/m  RA Volume:   19.90 ml  9.80 ml/m  LA Biplane Vol: 51.2 ml 25.20 ml/m   AORTIC VALVE  AV Area (Vmax):    0.91 cm  AV Area (Vmean):   0.85 cm  AV Area (VTI):     0.89 cm  AV Vmax:           378.00 cm/s  AV Vmean:          298.000 cm/s  AV VTI:            1.050 m  AV Peak Grad:      57.2 mmHg  AV Mean Grad:      38.0 mmHg  LVOT Vmax:         90.20 cm/s  LVOT Vmean:        66.400 cm/s  LVOT VTI:          0.245 m  LVOT/AV VTI ratio: 0.23  AI PHT:            306 msec    AORTA  Ao Root diam: 3.30 cm  Ao Asc diam:  3.30 cm   MITRAL VALVE                TRICUSPID VALVE  MV Area (PHT):              TR Peak grad:   16.8 mmHg  MV Decel Time:              TR Vmax:        205.00 cm/s  MV E velocity: 122.00 cm/s  Estimated RAP:  3.00 mmHg  MV A velocity: 80.70 cm/s   RVSP:           19.8 mmHg  MV E/A ratio:  1.51                              SHUNTS                              Systemic VTI:  0.24 m                              Systemic Diam: 2.20 cm   Morene Brownie  Electronically signed by Morene Brownie  Signature Date/Time: 09/30/2023/6:30:21 PM        Final     Physicians  Panel Physicians Referring Physician Case Authorizing Physician  Swaziland, Peter M, MD (Primary)     Procedures  RIGHT/LEFT HEART CATH AND CORONARY ANGIOGRAPHY   Conclusion      Prox LAD to Mid LAD lesion is 10% stenosed.   Prox RCA lesion is 40% stenosed.   Mid RCA lesion is 20% stenosed.   2nd Mrg lesion is 20% stenosed.   Ost LM lesion is 35% stenosed.   The left ventricular systolic function is normal.   LV end diastolic pressure is mildly elevated.   The left ventricular ejection fraction is 55-65% by visual estimate.   Hemodynamic findings consistent with mild pulmonary hypertension.   There is moderate aortic valve stenosis.   Nonobstructive CAD Moderate to severe aortic stenosis. Mean  AV gradient 31 mm Hg. AVA 1.2 cm squared, index 0.6 Mildly elevated LV filling pressures. LVEDP 17 mm Hg. PCWP 23/28, mean 19 mm Hg Mild pulmonary HTN. PAP 38/17, Mean 30 mm Hg Cardiac output 7.03 L/min, index 3.55   Plan: no significant CAD to explain symptoms. Clinical symptoms appear to be related to Aortic stenosis. Will refer to valve team to consider TAVR  Indications  Severe aortic stenosis [I35.0 (ICD-10-CM)]   Procedural Details  Technical Details Indication: 73 yo male with remote stenting of the proximal to mid LAD. Presents with progressive dyspnea and chest  pressure on exertion. Echo indicates severe AS  Procedural Details: The right wrist was prepped, draped, and anesthetized with 1% lidocaine . Ultrasound was used to guide access. Image was obtained and stored in the record. Using the modified Seldinger technique a 6 Fr slender sheath was placed in the right radial artery and a 5 French sheath was placed in the right brachial vein. A Swan-Ganz catheter was used for the right heart catheterization. Standard protocol was followed for recording of right heart pressures and sampling of oxygen saturations. Fick cardiac output was calculated. Standard Judkins catheters were used for selective coronary angiography and left ventriculography. There were no immediate procedural complications. The patient was transferred to the post catheterization recovery area for further monitoring. Contrast: 50 cc   Estimated blood loss <50 mL.   During this procedure medications were administered to achieve and maintain moderate conscious sedation while the patient's heart rate, blood pressure, and oxygen saturation were continuously monitored and I was present face-to-face 100% of this time. Leanna Mace Cardiovascular Specialist and Carrolyn Gals RN are independent, trained observers who assisted in the monitoring of the patient's level of consciousness.   Medications (Filter: Administrations  occurring from 0725 to 0822 on 10/05/23) fentaNYL  (SUBLIMAZE ) injection (mcg)  Total dose: 50 mcg Date/Time Rate/Dose/Volume Action   10/05/23 0741 25 mcg Given   0807 25 mcg Given   midazolam  (VERSED ) injection (mg)  Total dose: 2 mg Date/Time Rate/Dose/Volume Action   10/05/23 0741 1 mg Given   0807 1 mg Given   lidocaine  (PF) (XYLOCAINE ) 1 % injection (mL)  Total volume: 4 mL Date/Time Rate/Dose/Volume Action   10/05/23 0750 2 mL Given   0754 2 mL Given   Heparin  (Porcine) in NaCl 1000-0.9 UT/500ML-% SOLN (mL)  Total volume: 1,000 mL Date/Time Rate/Dose/Volume Action   10/05/23 0750 1,000 mL Given   Radial Cocktail/Verapamil  only (mL)  Total volume: 10 mL Date/Time Rate/Dose/Volume Action   10/05/23 0755 10 mL Given   heparin  sodium (porcine) injection (Units)  Total dose: 4,500 Units Date/Time Rate/Dose/Volume Action   10/05/23 0800 4,500 Units Given   iohexol  (OMNIPAQUE ) 350 MG/ML injection (mL)  Total volume: 50 mL Date/Time Rate/Dose/Volume Action   10/05/23 0811 50 mL Given    Sedation Time  Sedation Time Physician-1: 31 minutes 40 seconds Contrast     Administrations occurring from 0725 to 0822 on 10/05/23:  Medication Name Total Dose  iohexol  (OMNIPAQUE ) 350 MG/ML injection 50 mL   Radiation/Fluoro  Fluoro time: 4.4 (min) DAP: 14513 (mGycm2) Cumulative Air Kerma: 271 (mGy) Complications  Complications documented before study signed (10/05/2023  8:23 AM)   No complications were associated with this study.  Documented by Gals Carrolyn SAUNDERS, RN - 10/05/2023  8:13 AM     Coronary Findings  Diagnostic Dominance: Right Left Main  Ost LM lesion is 35% stenosed.    Left Anterior Descending  Vessel is large.  Prox LAD to Mid LAD lesion is 10% stenosed. The lesion was previously treated using a drug eluting stent over 2 years ago.    Left Circumflex  Vessel is large.    Second Obtuse Marginal Branch  Vessel is large in size.  2nd Mrg lesion is 20%  stenosed.    Right Coronary Artery  Vessel is large.  Prox RCA lesion is  40% stenosed.  Mid RCA lesion is 20% stenosed.    Intervention   No interventions have been documented.   Right Heart  Right Heart Pressures Hemodynamic findings consistent with mild pulmonary hypertension.   Left Heart  Left Ventricle The left ventricular size is normal. The left ventricular systolic function is normal. LV end diastolic pressure is mildly elevated. The left ventricular ejection fraction is 55-65% by visual estimate. No regional wall motion abnormalities.  Aortic Valve There is moderate aortic valve stenosis.   Coronary Diagrams  Diagnostic Dominance: Right  Intervention   Implants   No implant documentation for this case.   Syngo Images   Show images for CARDIAC CATHETERIZATION Images on Long Term Storage   Show images for Correll, Denbow, Dr. Dr. Sheppard Higashi to Procedure Log  Procedure Log    Hemo Data  Flowsheet Row Most Recent Value  Fick Cardiac Output 7.03 L/min  Fick Cardiac Output Index 3.55 (L/min)/BSA  Aortic Mean Gradient 30.89 mmHg  Aortic Peak Gradient 33.6 mmHg  Aortic Valve Area 1.23  Aortic Value Area Index 0.62 cm2/BSA  RA A Wave 15 mmHg  RA V Wave 13 mmHg  RA Mean 10 mmHg  RV Systolic Pressure 43 mmHg  RV Diastolic Pressure 2 mmHg  RV EDP 13 mmHg  PA Systolic Pressure 38 mmHg  PA Diastolic Pressure 17 mmHg  PA Mean 30 mmHg  PW A Wave 23 mmHg  PW V Wave 28 mmHg  PW Mean 19 mmHg  AO Systolic Pressure 95 mmHg  AO Diastolic Pressure 58 mmHg  AO Mean 72 mmHg  LV Systolic Pressure 148 mmHg  LV Diastolic Pressure 9 mmHg  LV EDP 25 mmHg  Arterial Occlusion Pressure Extended Systolic Pressure 108 mmHg  Arterial Occlusion Pressure Extended Diastolic Pressure 50 mmHg  Arterial Occlusion Pressure Extended Mean Pressure 75 mmHg  Left Ventricular Apex Extended Systolic Pressure 140 mmHg  LVp Diastolic Pressure 3 mmHg  Left Ventricular Apex Extended  EDP Pressure 17 mmHg  QP/QS 1  TPVR Index 8.44 HRUI  TSVR Index 20.26 HRUI  PVR SVR Ratio 0.18  TPVR/TSVR Ratio 0.42    ADDENDUM REPORT: 10/12/2023 17:36   ADDENDUM: Limited non-coronary overread as follows:   Cardiovascular: Dense aortic valve calcifications. Mild cardiomegaly with subendocardial scarring of the left ventricular apex. No pericardial effusion.   Limited Mediastinum/Nodes: No enlarged mediastinal, hilar, or axillary lymph nodes. Trachea and esophagus demonstrate no significant findings.   Limited Lungs/Pleura: Lungs are clear. No pleural effusion or pneumothorax.   Upper Abdomen: No acute abnormality.   Musculoskeletal: No chest wall abnormality. No acute osseous findings.   IMPRESSION: 1.  No acute extracardiac findings in the included chest.   2. Dense aortic valve calcifications in keeping with aortic stenosis.   3. 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     Electronically Signed   By: Marolyn JONETTA Jaksch M.D.   On: 10/12/2023 17:36    Addended by Jaksch Marolyn JONETTA, MD on 10/12/2023  5:38 PM    Study Result  Narrative & Impression  CLINICAL DATA:  [phone] with aortic stenosis being evaluated for a TAVR procedure.   EXAM: Cardiac TAVR CT   TECHNIQUE: A non-contrast, gated CT scan was obtained with axial slices of 2.5 mm through the heart for aortic valve scoring. A 120 kV retrospective, gated, contrast cardiac scan was obtained. Gantry rotation speed was 230 msec and collimation was 0.63 mm. Nitroglycerin  was not given. A delayed scan was obtained  to exclude left atrial appendage thrombus. The 3D dataset was reconstructed in systole with motion correction. The 3D data set was reconstructed in 5% intervals of the 0-95% of the R-R cycle. Systolic and diastolic phases were analyzed on a dedicated workstation using MPR, MIP, and VRT modes. The patient received 100 cc of contrast.   FINDINGS: Aortic Root:   Aortic  valve: trileaflet   Aortic valve calcium  score: 1508   Aortic annulus:   Diameter: 30mm x 25mm   Perimeter: 85mm   Area: 568mm^1   Calcifications: No calcifications   Coronary height: Min Left - 14mm; Min Right - 19mm   Sinotubular height: Left cusp - 22mm; Right cusp - 24mm; Noncoronary cusp - 19mm   LVOT (as measured 3 mm below the annulus):   Diameter: 32mm x 24mm   Area: 592mm^2   Calcifications: No calcifications   Aortic sinus width: Left cusp - 34mm; Right cusp - 31mm; Noncoronary cusp - 33mm   Sinotubular junction width: 28mm x 27mm   Optimum Fluoroscopic Angle for Delivery: LAO 11 CAU 3   Cardiac:   Right atrium: Normal size   Right ventricle: Normal size   Pulmonary arteries: Normal size   Pulmonary veins: Normal configuration   Left atrium: Mild enlargement   Left ventricle: Normal size   Pericardium: Normal thickness   Coronary arteries: Normal origin.  S/p stent in proximal to mid LAD   IMPRESSION: 1. Tricuspid aortic valve with moderate calcifications (AV calcium  score 1508)   2. Aortic annulus measures 30mm x 25mm in diameter with perimeter 85mm and area 561 mm^2. No annular or LVOT calcifications. Annular measurements suitable for delivery of 29mm Edwards Sapien 3 valve   3.  Sufficient coronary to annulus distance.   4.  Optimum Fluoroscopic Angle for Delivery:  LAO 11 CAU 3   Electronically Signed: By: Lonni Nanas M.D. On: 10/12/2023 16:19      Narrative & Impression  CLINICAL DATA:  Preoperative evaluation, TAVR   EXAM: CT ANGIOGRAPHY CHEST, ABDOMEN AND PELVIS   TECHNIQUE: Multidetector CT imaging through the chest, abdomen and pelvis was performed using the standard protocol during bolus administration of intravenous contrast. Multiplanar reconstructed images and MIPs were obtained and reviewed to evaluate the vascular anatomy.   RADIATION DOSE REDUCTION: This exam was performed according to  the departmental dose-optimization program which includes automated exposure control, adjustment of the mA and/or kV according to patient size and/or use of iterative reconstruction technique.   CONTRAST:  OMNIPAQUE  IOHEXOL  350 MG/ML SOLN   COMPARISON:  None Available.   FINDINGS: CTA CHEST FINDINGS   VASCULAR   Aorta: Satisfactory opacification of the aorta. Normal contour and caliber of the thoracic aorta. No evidence of aneurysm, dissection, or other acute aortic pathology.   Cardiovascular: No evidence of pulmonary embolism on limited non-tailored examination. Mild cardiomegaly. Subendocardial fibrofatty scarring of the left ventricular apex in keeping with prior infarction. Three-vessel coronary artery calcifications and stents. No pericardial effusion. Dense aortic valve calcifications mild aortic atherosclerosis.   Review of the MIP images confirms the above findings.   NON VASCULAR   Mediastinum/Nodes: No enlarged mediastinal, hilar, or axillary lymph nodes. Multiple small calcified thyroid  nodules, benign, requiring no specific further follow-up or characterization. Trachea, and esophagus demonstrate no significant findings.   Lungs/Pleura: Lungs are clear. No pleural effusion or pneumothorax.   Musculoskeletal: No chest wall abnormality. No acute osseous findings.   Review of the MIP images confirms the above findings.  CTA ABDOMEN AND PELVIS FINDINGS   VASCULAR   Normal contour and caliber of the abdominal aorta. No evidence of aneurysm, dissection, or other acute aortic pathology. Accessory origin of the left gastric artery with duplicated left renal arteries, solitary right renal artery, and otherwise standard branching pattern of the abdominal aorta. Mild aortic atherosclerosis with minimal tortuosity of the bilateral iliac systems.   Review of the MIP images confirms the above findings.   NON-VASCULAR   Hepatobiliary: No solid liver  abnormality is seen. Hepatic steatosis. Small gallstones. No gallbladder wall thickening, or biliary dilatation.   Pancreas: Unremarkable. No pancreatic ductal dilatation or surrounding inflammatory changes.   Spleen: Normal in size without significant abnormality.   Adrenals/Urinary Tract: Adrenal glands are unremarkable. Kidneys are normal, without renal calculi, solid lesion, or hydronephrosis. Bladder is unremarkable.   Stomach/Bowel: Stomach is within normal limits. Appendix appears normal. No evidence of bowel wall thickening, distention, or inflammatory changes.   Lymphatic: No enlarged abdominal or pelvic lymph nodes.   Reproductive: Prostatomegaly.   Other: Evidence of prior inguinal hernia repair.  No ascites.   Musculoskeletal: No acute osseous findings.   IMPRESSION: 1. Normal contour and caliber of the thoracic and abdominal aorta. No evidence of aneurysm, dissection, or other acute aortic pathology. Mild aortic atherosclerosis with minimal tortuosity of the bilateral iliac systems. Please see separately reported TAVR evaluation for relevant measurements. 2. Dense aortic valve calcifications in keeping with aortic stenosis. 3. Mild cardiomegaly. Subendocardial fibrofatty scarring of the left ventricular apex in keeping with prior infarction. Three-vessel coronary artery calcifications and stents. 4. Hepatic steatosis. 5. Cholelithiasis. 6. Prostatomegaly.   Aortic Atherosclerosis (ICD10-I70.0).     Electronically Signed   By: Marolyn JONETTA Jaksch M.D.   On: 10/12/2023 17:42      Impression:  This 73 year old gentleman has stage D, severe, symptomatic aortic stenosis with NYHA class II symptoms of exertional shortness of breath associated with some exertional fatigue and substernal chest tightness when he was recently on vacation in [place].  I have personally reviewed his 2D echocardiogram, cardiac catheterization, and CTA studies.  His echocardiogram shows a  calcified aortic valve with restricted leaflet mobility and a mean gradient of 38 mmHg, valve area of 0.9 cm, and dimensionless index of 0.23 consistent with severe aortic stenosis.  There is mild aortic insufficiency.  Cardiac catheterization showed nonobstructive coronary disease with patent LAD stents.  The mean aortic valve gradient was 31 mmHg.  I agree that aortic valve replacement is indicated in this patient for relief of the symptoms and to prevent progressive left ventricular dysfunction.  I agree that transcatheter aortic valve replacement is a reasonable option for treating him.  His gated cardiac CTA shows anatomy suitable for TAVR using a 29 mm SAPIEN 3 valve.  His abdominal and pelvic CTA shows adequate pelvic vascular anatomy to allow transfemoral insertion.  The patient was counseled at length regarding treatment alternatives for management of severe symptomatic aortic stenosis. The risks and benefits of surgical intervention has been discussed in detail. Long-term prognosis with medical therapy was discussed. Alternative approaches such as conventional surgical aortic valve replacement, transcatheter aortic valve replacement, and palliative medical therapy were compared and contrasted at length. This discussion was placed in the context of the patient's own specific clinical presentation and past medical history. All of his questions have been addressed.   Following the decision to proceed with transcatheter aortic valve replacement, a discussion was held regarding what types of management strategies would be attempted intraoperatively  in the event of life-threatening complications, including whether or not the patient would be considered a candidate for the use of cardiopulmonary bypass and/or conversion to open sternotomy for attempted surgical intervention.  He has an active overall fairly healthy 73 year old gentleman who would be a candidate for emergent sternotomy to manage any  intraoperative complications.  The patient has been advised of a variety of complications that might develop including but not limited to risks of death, stroke, paravalvular leak, aortic dissection or other major vascular complications, aortic annulus rupture, device embolization, cardiac rupture or perforation, mitral regurgitation, acute myocardial infarction, arrhythmia, heart block or bradycardia requiring permanent pacemaker placement, congestive heart failure, respiratory failure, renal failure, pneumonia, infection, other late complications related to structural valve deterioration or migration, or other complications that might ultimately cause a temporary or permanent loss of functional independence or other long term morbidity.  He has an upcoming appointment with Dr. Peter [place] and would like to discuss treatment of his aortic stenosis with him before making a decision about proceeding with TAVR.    Plan:  He is going to follow-up with Dr. [place] in the near future and then will make a decision about proceeding with treatment of his severe aortic stenosis.   I spent 45 minutes performing this consultation and > 50% of this time was spent face to face counseling and coordinating the care of this patient's severe aortic stenosis.  Dorise LOIS Fellers, MD 11/04/2023

## 2023-11-04 NOTE — Progress Notes (Unsigned)
 Pre Surgical Assessment: 5 M Walk Test  11M=16.82ft  5 Meter Walk Test- trial 1: 6.81 seconds 5 Meter Walk Test- trial 2: 6.86 seconds 5 Meter Walk Test- trial 3: 6.81 seconds 5 Meter Walk Test Average: 6.82 seconds

## 2023-11-09 ENCOUNTER — Encounter: Payer: Self-pay | Admitting: Cardiology

## 2023-11-09 ENCOUNTER — Ambulatory Visit: Attending: Cardiology | Admitting: Cardiology

## 2023-11-09 VITALS — BP 122/84 | HR 73 | Ht 68.0 in | Wt 183.4 lb

## 2023-11-09 DIAGNOSIS — I350 Nonrheumatic aortic (valve) stenosis: Secondary | ICD-10-CM | POA: Diagnosis present

## 2023-11-09 DIAGNOSIS — I25110 Atherosclerotic heart disease of native coronary artery with unstable angina pectoris: Secondary | ICD-10-CM | POA: Insufficient documentation

## 2023-11-09 NOTE — Patient Instructions (Signed)
 Medication Instructions:  Continue all medications *If you need a refill on your cardiac medications before your next appointment, please call your pharmacy*  Lab Work: None ordered  Testing/Procedures: None ordered  Follow-Up: At [facility], you and your health needs are our priority.  As part of our continuing mission to provide you with exceptional heart care, our providers are all part of one team.  This team includes your primary Cardiologist (physician) and Advanced Practice Providers or APPs (Physician Assistants and Nurse Practitioners) who all work together to provide you with the care you need, when you need it.  Your next appointment:  6 months  Call in Oct to schedule Feb appointment     Provider:  Dr.Jordan   We recommend signing up for the patient portal called MyChart.  Sign up information is provided on this After Visit Summary.  MyChart is used to connect with patients for Virtual Visits (Telemedicine).  Patients are able to view lab/test results, encounter notes, upcoming appointments, etc.  Non-urgent messages can be sent to your provider as well.   To learn more about what you can do with MyChart, go to [email or web address].

## 2023-11-11 ENCOUNTER — Other Ambulatory Visit: Payer: Self-pay

## 2023-11-11 DIAGNOSIS — I350 Nonrheumatic aortic (valve) stenosis: Secondary | ICD-10-CM

## 2023-11-20 ENCOUNTER — Other Ambulatory Visit: Payer: Self-pay

## 2023-11-20 ENCOUNTER — Ambulatory Visit: Payer: Self-pay | Admitting: Physician Assistant

## 2023-11-20 ENCOUNTER — Other Ambulatory Visit (HOSPITAL_COMMUNITY)

## 2023-11-20 ENCOUNTER — Encounter (HOSPITAL_COMMUNITY)
Admission: RE | Admit: 2023-11-20 | Discharge: 2023-11-20 | Disposition: A | Discharge status: Home / Self Care | Source: Ambulatory Visit | Attending: Cardiovascular Disease | Admitting: Cardiovascular Disease

## 2023-11-20 ENCOUNTER — Ambulatory Visit (HOSPITAL_COMMUNITY)
Admission: RE | Admit: 2023-11-20 | Discharge: 2023-11-20 | Disposition: A | Discharge status: Home / Self Care | Source: Ambulatory Visit | Attending: Cardiovascular Disease | Admitting: Cardiovascular Disease

## 2023-11-20 DIAGNOSIS — Z01818 Encounter for other preprocedural examination: Secondary | ICD-10-CM

## 2023-11-20 DIAGNOSIS — I350 Nonrheumatic aortic (valve) stenosis: Secondary | ICD-10-CM

## 2023-11-20 LAB — COMPREHENSIVE METABOLIC PANEL WITH GFR
ALT: 18 U/L (ref 0–44)
AST: 24 U/L (ref 15–41)
Albumin: 3.7 g/dL (ref 3.5–5.0)
Alkaline Phosphatase: 55 U/L (ref 38–126)
Anion gap: 10 (ref 5–15)
BUN: 12 mg/dL (ref 8–23)
CO2: 23 mmol/L (ref 22–32)
Calcium: 9.1 mg/dL (ref 8.9–10.3)
Chloride: 104 mmol/L (ref 98–111)
Creatinine, Ser: 0.82 mg/dL (ref 0.61–1.24)
GFR, Estimated: >60 mL/min (ref >60)
Glucose, Bld: 105 mg/dL — ABNORMAL HIGH (ref 70–99)
Potassium: 4 mmol/L (ref 3.5–5.1)
Sodium: 137 mmol/L (ref 135–145)
Total Bilirubin: 1.2 mg/dL (ref 0.0–1.2)
Total Protein: 7.2 g/dL (ref 6.5–8.1)

## 2023-11-20 LAB — SURGICAL PCR SCREEN
MRSA, PCR: NEGATIVE
Staphylococcus aureus: NEGATIVE

## 2023-11-20 LAB — URINALYSIS, ROUTINE W REFLEX MICROSCOPIC
Bilirubin Urine: NEGATIVE
Glucose, UA: NEGATIVE mg/dL
Hgb urine dipstick: NEGATIVE
Ketones, ur: NEGATIVE mg/dL
Leukocytes,Ua: NEGATIVE
Nitrite: NEGATIVE
Protein, ur: NEGATIVE mg/dL
Specific Gravity, Urine: 1.016 (ref 1.005–1.030)
pH: 5 (ref 5.0–8.0)

## 2023-11-20 LAB — CBC
HCT: 42.4 % (ref 39.0–52.0)
Hemoglobin: 14.2 g/dL (ref 13.0–17.0)
MCH: 29.6 pg (ref 26.0–34.0)
MCHC: 33.5 g/dL (ref 30.0–36.0)
MCV: 88.5 fL (ref 80.0–100.0)
Platelets: 187 K/uL (ref 150–400)
RBC: 4.79 MIL/uL (ref 4.22–5.81)
RDW: 13.2 % (ref 11.5–15.5)
WBC: 5.5 K/uL (ref 4.0–10.5)
nRBC: 0 % (ref 0.0–0.2)

## 2023-11-20 LAB — PROTIME-INR
INR: 1.2 (ref 0.8–1.2)
Prothrombin Time: 15.4 s — ABNORMAL HIGH (ref 11.4–15.2)

## 2023-11-20 NOTE — Progress Notes (Signed)
 Patient signed all consents at PAT lab appointment. CHG soap and instructions were given to patient. CHG surgical prep reviewed with patient and all questions answered.  Patients chart send to anesthesia for review. Pt denies any respiratory illness/infection in the last two months.

## 2023-11-23 MED ORDER — HEPARIN 30,000 UNITS/1000 ML (OHS) CELLSAVER SOLUTION
Status: DC
  Filled 2023-11-23: qty 1000

## 2023-11-23 MED ORDER — NOREPINEPHRINE 4 MG/250ML-% IV SOLN
0.0000 ug/min | INTRAVENOUS | Status: AC
  Administered 2023-11-24: 1 ug/min via INTRAVENOUS
  Filled 2023-11-23: qty 250

## 2023-11-23 MED ORDER — MAGNESIUM SULFATE 50 % IJ SOLN
40.0000 meq | INTRAMUSCULAR | Status: DC
  Filled 2023-11-23: qty 9.85

## 2023-11-23 MED ORDER — POTASSIUM CHLORIDE 2 MEQ/ML IV SOLN
80.0000 meq | INTRAVENOUS | Status: DC
  Filled 2023-11-23: qty 40

## 2023-11-23 MED ORDER — DEXMEDETOMIDINE HCL IN NACL 400 MCG/100ML IV SOLN
0.1000 ug/kg/h | INTRAVENOUS | Status: AC
  Administered 2023-11-24: .5 ug/kg/h via INTRAVENOUS
  Administered 2023-11-24: 41.6 ug via INTRAVENOUS
  Filled 2023-11-23: qty 100

## 2023-11-23 MED ORDER — CEFAZOLIN SODIUM-DEXTROSE 2-4 GM/100ML-% IV SOLN
2.0000 g | INTRAVENOUS | Status: AC
  Administered 2023-11-24: 2 g via INTRAVENOUS
  Filled 2023-11-23 (×2): qty 100

## 2023-11-23 NOTE — H&P (Signed)
 [address] [place] [address]             [phone]     CARDIOTHORACIC SURGERY ADMISSION HISTORY AND PHYSICAL   PCP is Corwin Antu, FNP Referring Provider is Dr. Ozell Fell Primary Cardiologist is Dr. 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  He had an outside echo in March 2024 showing a mean aortic valve gradient of 15 mmHg.  He just returned from a several month trip touring [place] and during that trip he noticed increased exertional shortness of breath and fatigue particularly with doing a lot of walking up hills.  This was associated with some exertional substernal chest tightness.  He denies any dizziness or syncope.  He has had no orthopnea or peripheral edema.  A recent echo in July 2025 showed a calcified aortic valve with a mean gradient of 38 mmHg and a dimensionless index of 0.24 with a calculated valve area of 0.89 cm.  Stroke-volume index was 46 with a left ventricular ejection fraction of 60 to 65%.  He reports that since returning home he lost about 10 pounds out of the 20 pounds that he gained on his trip and feels much better.  He has been walking 4 to 5 miles per day.  He feels like his symptoms have significantly improved since losing weight.       Past Medical History:  Diagnosis Date   Acute MI anterior wall subsequent episode care (HCC)      2014   Anxiety     Borderline diabetes     CAD (coronary artery disease)     Dyslipidemia     Dysrhythmia 2014    V-FIB   GERD (gastroesophageal reflux disease)     Heart murmur     Pre-diabetes     Sciatica     Thyroid  nodule      normal thyroid   function per patient               Past Surgical History:  Procedure Laterality Date   BIOPSY THYROID         pt reports negative   CARDIAC CATHETERIZATION       CORONARY ANGIOPLASTY WITH STENT PLACEMENT   2014    2 STENTS IN LAD   ESOPHAGOGASTRODUODENOSCOPY (EGD) WITH PROPOFOL  N/A 12/19/2020    Procedure: ESOPHAGOGASTRODUODENOSCOPY (EGD) WITH PROPOFOL ;  Surgeon: Dessa Reyes ORN, MD;  Location: [facility] ENDOSCOPY;  Service: Endoscopy;  Laterality: N/A;  1ST CASE   INGUINAL HERNIA REPAIR Bilateral 06/08/2023    Procedure: LAPAROSCOPIC BILATERAL INGUINAL HERNIA REPAIR WITH MESH;  Surgeon: Rubin Calamity, MD;  Location: [facility] OR;  Service: General;  Laterality: Bilateral;   KNEE ARTHROSCOPY Right 12/29/2014    Procedure: ARTHROSCOPY KNEE partial medial menisectomy;  Surgeon: Lynwood SHAUNNA Hue, MD;  Location: [facility] ORS;  Service: Orthopedics;  Laterality: Right;   KNEE ARTHROSCOPY Left 05/09/2016    Procedure: ARTHROSCOPY KNEE, PARTIAL MEDIAL MENISECTOMY;  Surgeon: Lynwood SHAUNNA Hue, MD;  Location: [facility] ORS;  Service: Orthopedics;  Laterality: Left;   LEFT HEART CATH AND CORONARY ANGIOGRAPHY N/A 07/30/2018    Procedure: LEFT HEART CATH AND CORONARY ANGIOGRAPHY;  Surgeon: Verlin Lonni BIRCH, MD;  Location: [facility] INVASIVE CV LAB;  Service: Cardiovascular;  Laterality: N/A;   LEFT HEART CATHETERIZATION WITH CORONARY ANGIOGRAM N/A 04/22/2012    Procedure: LEFT HEART CATHETERIZATION WITH CORONARY ANGIOGRAM;  Surgeon: [name], MD;  Location: [facility] CATH LAB;  Service: Cardiovascular;  Laterality: N/A;   LEFT HEART CATHETERIZATION WITH CORONARY ANGIOGRAM N/A 04/22/2013    Procedure: LEFT HEART CATHETERIZATION WITH CORONARY ANGIOGRAM;  Surgeon: [name], MD;  Location: [facility] CATH LAB;  Service: Cardiovascular;  Laterality: N/A;   MANDIBLE OSTEOTOMY       RIGHT/LEFT HEART CATH AND CORONARY ANGIOGRAPHY N/A 10/05/2023    Procedure: RIGHT/LEFT HEART CATH AND CORONARY ANGIOGRAPHY;  Surgeon: Swaziland, Peter M,  MD;  Location: [facility] INVASIVE CV LAB;  Service: Cardiovascular;  Laterality: N/A;   VASECTOMY                   Family History  Problem Relation Age of Onset   Hypertension Mother     Stroke Mother     Thyroid  disease Mother          thyroidectomy   COPD Father     Diabetes Father     Hypertension Father     Congestive Heart Failure Father            Social History         Socioeconomic History   Marital status: Married      Spouse name: Not on file   Number of children: Not on file   Years of education: Not on file   Highest education level: Not on file  Occupational History   Occupation: MD      Comment: [employer]  Tobacco Use   Smoking status: Former      Types: Cigars      Quit date: 12/26/2012      Years since quitting: 10.8   Smokeless tobacco: Never  Vaping Use   Vaping status: Never Used  Substance and Sexual Activity   Alcohol use: Not Currently      Comment: rarely   Drug use: No   Sexual activity: Not on file  Other Topics Concern   Not on file  Social History Narrative   Not on file    Social Drivers of Health        Financial Resource Strain: Low Risk  (08/06/2023)    Overall Financial Resource Strain (CARDIA)     Difficulty of Paying Living Expenses: Not hard at all  Food Insecurity: Food Insecurity Present (08/06/2023)    Hunger Vital Sign     Worried About Running Out of Food in the Last Year: Sometimes true     Ran Out of Food in the Last Year: Sometimes true  Transportation Needs: No Transportation Needs (08/06/2023)    PRAPARE - Therapist, art (Medical): No     Lack of Transportation (Non-Medical): No  Physical Activity: Sufficiently Active (08/06/2023)    Exercise Vital Sign     Days of Exercise per Week: 5 days     Minutes of Exercise per Session: 100 min  Stress: Patient Declined (08/06/2023)    [employer] of Occupational Health - Occupational Stress Questionnaire     Feeling of  Stress : Patient declined  Social Connections: Socially Isolated (08/06/2023)    Social Connection and Isolation Panel     Frequency of Communication with Friends and Family: More than three times a week  Frequency of Social Gatherings with Friends and Family: Three times a week     Attends Religious Services: Never     Active Member of Clubs or Organizations: No     Attends Banker Meetings: Never     Marital Status: Divorced  Catering manager Violence: Not At Risk (08/06/2023)    Humiliation, Afraid, Rape, and Kick questionnaire     Fear of Current or Ex-Partner: No     Emotionally Abused: No     Physically Abused: No     Sexually Abused: No             Prior to Admission medications   Medication Sig Start Date End Date Taking? Authorizing Provider  aspirin  EC 81 MG tablet Take 81 mg by mouth at bedtime.     Yes [provider]  atorvastatin  (LIPITOR ) 10 MG tablet Take 1 tablet (10 mg total) by mouth daily. 03/23/15   Yes Swaziland, Peter M, MD  carvedilol  (COREG ) 12.5 MG tablet Take 1 tablet (12.5 mg total) by mouth 2 (two) times daily. 03/23/15   Yes [place], Peter M, MD  enalapril  (VASOTEC ) 2.5 MG tablet Take 1 tablet (2.5 mg total) by mouth 2 (two) times daily. 03/23/15   Yes [place], Peter M, MD  fluticasone (FLONASE) 50 MCG/ACT nasal spray Place 1-2 sprays into both nostrils daily as needed for allergies. 09/04/23   Yes [provider]  omeprazole (PRILOSEC) 40 MG capsule Take 40 mg by mouth at bedtime.     Yes [provider]  ticagrelor  (BRILINTA ) 60 MG TABS tablet Take 1 tablet (60 mg total) by mouth 2 (two) times daily. 03/23/15   Yes Sw[place], Peter M, MD            Current Outpatient Medications  Medication Sig Dispense Refill   aspirin  EC 81 MG tablet Take 81 mg by mouth at bedtime.       atorvastatin  (LIPITOR ) 10 MG tablet Take 1 tablet (10 mg total) by mouth daily. 90 tablet 3   carvedilol  (COREG ) 12.5 MG tablet Take 1 tablet (12.5 mg total)  by mouth 2 (two) times daily. 180 tablet 3   enalapril  (VASOTEC ) 2.5 MG tablet Take 1 tablet (2.5 mg total) by mouth 2 (two) times daily. 180 tablet 3   fluticasone (FLONASE) 50 MCG/ACT nasal spray Place 1-2 sprays into both nostrils daily as needed for allergies.       omeprazole (PRILOSEC) 40 MG capsule Take 40 mg by mouth at bedtime.       ticagrelor  (BRILINTA ) 60 MG TABS tablet Take 1 tablet (60 mg total) by mouth 2 (two) times daily. 180 tablet 3      No current facility-administered medications for this visit.        Allergies       Allergies  Allergen Reactions   Morphine  And Codeine Nausea And Vomiting   Sulfa Antibiotics Rash      FIXED DRUG ERUPTION            Review of Systems:               General:                      normal appetite, normal energy, no weight gain, + weight loss, no fever             Cardiac:  no chest pain with exertion, no chest pain at rest, + SOB with moderate exertion, no resting SOB, no PND, no orthopnea, no palpitations, no arrhythmia, no atrial fibrillation, no LE edema, no dizzy spells, no syncope             Respiratory:                 + exertional shortness of breath, no home oxygen, no productive cough, no dry cough, no bronchitis, no wheezing, no hemoptysis, no asthma, no pain with inspiration or cough, no sleep apnea, no CPAP at night             GI:                               no difficulty swallowing, no reflux, + frequent heartburn, + hiatal hernia, no abdominal pain, no constipation, no diarrhea, no hematochezia, no hematemesis, no melena             GU:                              no dysuria,  no frequency, no urinary tract infection, no hematuria, no enlarged prostate, no kidney stones, no kidney disease             Vascular:                     no pain suggestive of claudication, no pain in feet, no leg cramps, no varicose veins, no DVT, on non-healing foot ulcer             Neuro:                         no  stroke, on TIA's, on seizures, no headaches, no temporary blindness one eye,  no slurred speech, no peripheral neuropathy, no chronic pain, no instability of gait, no memory/cognitive dysfunction             Musculoskeletal:         + arthritis - primarily involving the right knee, no joint swelling, no myalgias, no difficulty walking, normal mobility              Skin:                            no rash, no itching, no skin infections, no pressure sores or ulcerations             Psych:                         no anxiety, no depression, no nervousness, no unusual recent stress             Eyes:                           no blurry vision, no floaters, no recent vision changes, no glasses or contacts             ENT:                            no hearing loss, no loose or painful teeth, no dentures, last saw dentist this year  Hematologic:               no easy bruising, no abnormal bleeding, no clotting disorder, no frequent epistaxis             Endocrine:                   no diabetes, does not check CBG's at home                            Physical Exam:               BP 133/72   Pulse 69   Resp 18   Ht 5' 8 (1.727 m)   SpO2 98%   BMI 29.80 kg/m              General:                      well-appearing             HEENT:                       Unremarkable, NCAT, PERLA, EOMI             Neck:                           no JVD, no bruits, no adenopathy              Chest:                          clear to auscultation, symmetrical breath sounds, no wheezes, no rhonchi              CV:                              RRR, 3/6 systolic murmur RSB, no diastolic murmur             Abdomen:                    soft, non-tender, no masses              Extremities:                 warm, well-perfused, pedal pulses palpable, no lower extremity edema             Rectal/GU                   Deferred             Neuro:                         Grossly non-focal and symmetrical throughout              Skin:                            Clean and dry, no rashes, no breakdown   Diagnostic Tests:   ECHOCARDIOGRAM REPORT       Patient Name:   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  Exam Location:  [address]   Procedure: 2D Echo, 3D Echo, Cardiac Doppler, Color Doppler and Strain  Analysis            (Both Spectral and Color Flow Doppler were utilized during             procedure).   Indications:    I35.0 Aortic Stenosis    History:        Patient has prior history of Echocardiogram examinations,  most                 recent 06/05/2022. CAD and STEMI, Arrythmias:Ventricular                  Fibrillation, Signs/Symptoms:Chest Pain, Shortness of  Breath and                  Dyspnea; Risk Factors:Dyslipidemia.    Sonographer:    Waldo Guadalajara RCS  Referring Phys: [phone] [name]   IMPRESSIONS     1. Progression of AV disease since prior echocardiogram. DVI 0.24,  suspect borderline severe. The aortic valve is calcified. There is severe  calcifcation of the aortic valve. There is moderate thickening of the  aortic valve. Aortic valve regurgitation  is mild. Moderate to severe aortic valve stenosis. Aortic valve area, by  VTI measures 0.89 cm. Aortic valve mean gradient measures 38.0 mmHg.  Aortic valve Vmax measures 3.78 m/s.   2. Left ventricular ejection fraction, by estimation, is 60 to 65%. Left  ventricular ejection fraction by 3D volume is 70 %. The left ventricle has  normal function. The left ventricle has no regional wall motion  abnormalities. Left ventricular diastolic   parameters were normal. The average left ventricular global  longitudinal  strain is -20.3 %. The global longitudinal strain is normal.   3. Right ventricular systolic function is normal. The right ventricular  size is normal. There is normal pulmonary artery systolic pressure.   4. The mitral valve is normal in structure. Trivial mitral valve  regurgitation. No evidence of mitral stenosis.   5. The inferior vena cava is normal in size with greater than 50%  respiratory variability, suggesting right atrial pressure of 3 mmHg.   FINDINGS   Left Ventricle: Left ventricular ejection fraction, by estimation, is 60  to 65%. Left ventricular ejection fraction by 3D volume is 70 %. The left  ventricle has normal function. The left ventricle has no regional wall  motion abnormalities. The average  left ventricular global longitudinal strain is -20.3 %. Strain was  performed and the global longitudinal strain is normal. The left  ventricular internal cavity size was normal in size. There is no left  ventricular hypertrophy. Left ventricular diastolic  parameters  were normal.   Right Ventricle: The right ventricular size is normal. No increase in  right ventricular wall thickness. Right ventricular systolic function is  normal. There is normal pulmonary artery systolic pressure. The tricuspid  regurgitant velocity is 2.05 m/s, and   with an assumed right atrial pressure of 3 mmHg, the estimated right  ventricular systolic pressure is 19.8 mmHg.   Left Atrium: Left atrial size was normal in size.   Right Atrium: Right atrial size was normal in size.   Pericardium: Trivial pericardial effusion is present.   Mitral Valve: The mitral valve is normal in structure. Trivial mitral  valve regurgitation. No evidence of mitral valve stenosis.   Tricuspid Valve: The tricuspid valve is normal in structure. Tricuspid  valve regurgitation is mild . No evidence of tricuspid stenosis.   Aortic Valve: Progression of AV disease since prior echocardiogram. DVI   0.24, suspect borderline severe. The aortic valve is calcified. There is  severe calcifcation of the aortic valve. There is moderate thickening of  the aortic valve. Aortic valve  regurgitation is mild. Aortic regurgitation PHT measures 306 msec.  Moderate to severe aortic stenosis is present. Aortic valve mean gradient  measures 38.0 mmHg. Aortic valve peak gradient measures 57.2 mmHg. Aortic  valve area, by VTI measures 0.89 cm.   Pulmonic Valve: The pulmonic valve was normal in structure. Pulmonic valve  regurgitation is not visualized. No evidence of pulmonic stenosis.   Aorta: The aortic root is normal in size and structure.   Venous: The inferior vena cava is normal in size with greater than 50%  respiratory variability, suggesting right atrial pressure of 3 mmHg.   IAS/Shunts: No atrial level shunt detected by color flow Doppler.   Additional Comments: 3D was performed not requiring image post processing  on an independent workstation and was normal.     LEFT VENTRICLE  PLAX 2D  LVIDd:         4.70 cm         Diastology  LVIDs:         2.60 cm         LV e' medial:    9.57 cm/s  LV PW:         0.90 cm         LV E/e' medial:  12.7  LV IVS:        1.20 cm         LV e' lateral:   11.20 cm/s  LVOT diam:     2.20 cm         LV E/e' lateral: 10.9  LV SV:         93  LV SV Index:   46              2D Longitudinal  LVOT Area:     3.80 cm        Strain                                 2D Strain GLS   -24.2 %                                 (A4C):  2D Strain GLS   -18.2 %                                 (A3C):                                 2D Strain GLS   -18.5 %                                 (A2C):                                 2D Strain GLS   -20.3 %                                 Avg:                                   3D Volume EF                                 LV 3D EF:    Left                                               ventricul                                              ar                                              ejection                                              fraction                                              by 3D                                              volume is                                              70 %.  3D Volume EF:                                 3D EF:        70 %                                 LV EDV:       106 ml                                 LV ESV:       32 ml                                 LV SV:        74 ml   RIGHT VENTRICLE  RV Basal diam:  3.60 cm  RV S prime:     15.80 cm/s  TAPSE (M-mode): 2.3 cm  RVSP:           19.8 mmHg   LEFT ATRIUM             Index        RIGHT ATRIUM           Index  LA diam:        4.30 cm 2.12 cm/m   RA Pressure: 3.00 mmHg  LA Vol (A2C):   58.9 ml 28.99 ml/m  RA Area:     10.50 cm  LA Vol (A4C):   41.1 ml 20.23 ml/m  RA Volume:   19.90 ml  9.80 ml/m  LA Biplane Vol: 51.2 ml 25.20 ml/m   AORTIC VALVE  AV Area (Vmax):    0.91 cm  AV Area (Vmean):   0.85 cm  AV Area (VTI):     0.89 cm  AV Vmax:           378.00 cm/s  AV Vmean:          298.000 cm/s  AV VTI:            1.050 m  AV Peak Grad:      57.2 mmHg  AV Mean Grad:      38.0 mmHg  LVOT Vmax:         90.20 cm/s  LVOT Vmean:        66.400 cm/s  LVOT VTI:          0.245 m  LVOT/AV VTI ratio: 0.23  AI PHT:            306 msec    AORTA  Ao Root diam: 3.30 cm  Ao Asc diam:  3.30 cm   MITRAL VALVE                TRICUSPID VALVE  MV Area (PHT):              TR Peak grad:   16.8 mmHg  MV Decel Time:              TR Vmax:        205.00 cm/s  MV E velocity: 122.00 cm/s  Estimated RAP:  3.00 mmHg  MV A velocity: 80.70 cm/s   RVSP:           19.8 mmHg  MV  E/A ratio:  1.51                              SHUNTS                              Systemic VTI:  0.24 m                              Systemic Diam: 2.20 cm   Morene Brownie  Electronically signed by Morene Brownie  Signature Date/Time: 09/30/2023/6:30:21 PM        Final      Physicians   Panel Physicians Referring Physician Case Authorizing Physician  Swaziland, Peter M, MD (Primary)        Procedures   RIGHT/LEFT HEART CATH AND CORONARY ANGIOGRAPHY    Conclusion       Prox LAD to Mid LAD lesion is 10% stenosed.   Prox RCA lesion is 40% stenosed.   Mid RCA lesion is 20% stenosed.   2nd Mrg lesion is 20% stenosed.   Ost LM lesion is 35% stenosed.   The left ventricular systolic function is normal.   LV end diastolic pressure is mildly elevated.   The left ventricular ejection fraction is 55-65% by visual estimate.   Hemodynamic findings consistent with mild pulmonary hypertension.   There is moderate aortic valve stenosis.   Nonobstructive CAD Moderate to severe aortic stenosis. Mean AV gradient 31 mm Hg. AVA 1.2 cm squared, index 0.6 Mildly elevated LV filling pressures. LVEDP 17 mm Hg. PCWP 23/28, mean 19 mm Hg Mild pulmonary HTN. PAP 38/17, Mean 30 mm Hg Cardiac output 7.03 L/min, index 3.55   Plan: no significant CAD to explain symptoms. Clinical symptoms appear to be related to Aortic stenosis. Will refer to valve team to consider TAVR   Indications   Severe aortic stenosis [I35.0 (ICD-10-CM)]    Procedural Details   Technical Details Indication: 73 yo male with remote stenting of the proximal to mid LAD. Presents with progressive dyspnea and chest  pressure on exertion. Echo indicates severe AS  Procedural Details: The right wrist was prepped, draped, and anesthetized with 1% lidocaine . Ultrasound was used to guide access. Image was obtained and stored in the record. Using the modified Seldinger technique a 6 Fr slender sheath was placed in the right radial artery and a 5 French sheath was placed in the right brachial vein. A Swan-Ganz catheter was used for the right heart catheterization. Standard protocol was followed for  recording of right heart pressures and sampling of oxygen saturations. Fick cardiac output was calculated. Standard Judkins catheters were used for selective coronary angiography and left ventriculography. There were no immediate procedural complications. The patient was transferred to the post catheterization recovery area for further monitoring. Contrast: 50 cc   Estimated blood loss <50 mL.   During this procedure medications were administered to achieve and maintain moderate conscious sedation while the patient's heart rate, blood pressure, and oxygen saturation were continuously monitored and I was present face-to-face 100% of this time. Leanna Mace Cardiovascular Specialist and Carrolyn Gals RN are independent, trained observers who assisted in the monitoring of the patient's level of consciousness.    Medications (Filter: Administrations occurring from 0725 to 0822 on 10/05/23) fentaNYL  (SUBLIMAZE ) injection (mcg)  Total dose: 50 mcg Date/Time Rate/Dose/Volume Action  10/05/23 0741 25 mcg Given    0807 25 mcg Given    midazolam  (VERSED ) injection (mg)  Total dose: 2 mg Date/Time Rate/Dose/Volume Action    10/05/23 0741 1 mg Given    0807 1 mg Given    lidocaine  (PF) (XYLOCAINE ) 1 % injection (mL)  Total volume: 4 mL Date/Time Rate/Dose/Volume Action    10/05/23 0750 2 mL Given    0754 2 mL Given    Heparin  (Porcine) in NaCl 1000-0.9 UT/500ML-% SOLN (mL)  Total volume: 1,000 mL Date/Time Rate/Dose/Volume Action    10/05/23 0750 1,000 mL Given    Radial Cocktail/Verapamil  only (mL)  Total volume: 10 mL Date/Time Rate/Dose/Volume Action    10/05/23 0755 10 mL Given    heparin  sodium (porcine) injection (Units)  Total dose: 4,500 Units Date/Time Rate/Dose/Volume Action    10/05/23 0800 4,500 Units Given    iohexol  (OMNIPAQUE ) 350 MG/ML injection (mL)  Total volume: 50 mL Date/Time Rate/Dose/Volume Action    10/05/23 0811 50 mL Given      Sedation Time   Sedation Time  Physician-1: 31 minutes 40 seconds Contrast        Administrations occurring from 0725 to 0822 on 10/05/23:  Medication Name Total Dose  iohexol  (OMNIPAQUE ) 350 MG/ML injection 50 mL    Radiation/Fluoro   Fluoro time: 4.4 (min) DAP: 14513 (mGycm2) Cumulative Air Kerma: 271 (mGy) Complications   Complications documented before study signed (10/05/2023  8:23 AM)    No complications were associated with this study.  Documented by Alwin Carrolyn SAUNDERS, RN - 10/05/2023  8:13 AM      Coronary Findings   Diagnostic Dominance: Right Left Main  Ost LM lesion is 35% stenosed.    Left Anterior Descending  Vessel is large.  Prox LAD to Mid LAD lesion is 10% stenosed. The lesion was previously treated using a drug eluting stent over 2 years ago.    Left Circumflex  Vessel is large.    Second Obtuse Marginal Branch  Vessel is large in size.  2nd Mrg lesion is 20% stenosed.    Right Coronary Artery  Vessel is large.  Prox RCA lesion is 40% stenosed.  Mid RCA lesion is 20% stenosed.    Intervention    No interventions have been documented.    Right Heart   Right Heart Pressures Hemodynamic findings consistent with mild pulmonary hypertension.    Left Heart   Left Ventricle The left ventricular size is normal. The left ventricular systolic function is normal. LV end diastolic pressure is mildly elevated. The left ventricular ejection fraction is 55-65% by visual estimate. No regional wall motion abnormalities.  Aortic Valve There is moderate aortic valve stenosis.    Coronary Diagrams   Diagnostic Dominance: Right  Intervention    Implants    No implant documentation for this case.    Syngo Images    Show images for CARDIAC CATHETERIZATION Images on Long Term Storage    Show images for [place], [place] J, Dr. Dr. Sheppard Higashi to Procedure Log   Procedure Log    Hemo Data   Flowsheet Row Most Recent Value  Fick Cardiac Output 7.03 L/min  Fick Cardiac Output Index  3.55 (L/min)/BSA  Aortic Mean Gradient 30.89 mmHg  Aortic Peak Gradient 33.6 mmHg  Aortic Valve Area 1.23  Aortic Value Area Index 0.62 cm2/BSA  RA A Wave 15 mmHg  RA V Wave 13 mmHg  RA Mean 10 mmHg  RV Systolic Pressure 43 mmHg  RV  Diastolic Pressure 2 mmHg  RV EDP 13 mmHg  PA Systolic Pressure 38 mmHg  PA Diastolic Pressure 17 mmHg  PA Mean 30 mmHg  PW A Wave 23 mmHg  PW V Wave 28 mmHg  PW Mean 19 mmHg  AO Systolic Pressure 95 mmHg  AO Diastolic Pressure 58 mmHg  AO Mean 72 mmHg  LV Systolic Pressure 148 mmHg  LV Diastolic Pressure 9 mmHg  LV EDP 25 mmHg  Arterial Occlusion Pressure Extended Systolic Pressure 108 mmHg  Arterial Occlusion Pressure Extended Diastolic Pressure 50 mmHg  Arterial Occlusion Pressure Extended Mean Pressure 75 mmHg  Left Ventricular Apex Extended Systolic Pressure 140 mmHg  LVp Diastolic Pressure 3 mmHg  Left Ventricular Apex Extended EDP Pressure 17 mmHg  QP/QS 1  TPVR Index 8.44 HRUI  TSVR Index 20.26 HRUI  PVR SVR Ratio 0.18  TPVR/TSVR Ratio 0.42      ADDENDUM REPORT: 10/12/2023 17:36   ADDENDUM: Limited non-coronary overread as follows:   Cardiovascular: Dense aortic valve calcifications. Mild cardiomegaly with subendocardial scarring of the left ventricular apex. No pericardial effusion.   Limited Mediastinum/Nodes: No enlarged mediastinal, hilar, or axillary lymph nodes. Trachea and esophagus demonstrate no significant findings.   Limited Lungs/Pleura: Lungs are clear. No pleural effusion or pneumothorax.   Upper Abdomen: No acute abnormality.   Musculoskeletal: No chest wall abnormality. No acute osseous findings.   IMPRESSION: 1.  No acute extracardiac findings in the included chest.   2. Dense aortic valve calcifications in keeping with aortic stenosis.   3. 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     Electronically Signed   By: Marolyn JONETTA Jaksch M.D.   On: 10/12/2023 17:36     Addended by Jaksch Marolyn JONETTA, MD on 10/12/2023  5:38 PM    Study Result   Narrative & Impression  CLINICAL DATA:  [phone] with aortic stenosis being evaluated for a TAVR procedure.   EXAM: Cardiac TAVR CT   TECHNIQUE: A non-contrast, gated CT scan was obtained with axial slices of 2.5 mm through the heart for aortic valve scoring. A 120 kV retrospective, gated, contrast cardiac scan was obtained. Gantry rotation speed was 230 msec and collimation was 0.63 mm. Nitroglycerin  was not given. A delayed scan was obtained to exclude left atrial appendage thrombus. The 3D dataset was reconstructed in systole with motion correction. The 3D data set was reconstructed in 5% intervals of the 0-95% of the R-R cycle. Systolic and diastolic phases were analyzed on a dedicated workstation using MPR, MIP, and VRT modes. The patient received 100 cc of contrast.   FINDINGS: Aortic Root:   Aortic valve: trileaflet   Aortic valve calcium  score: 1508   Aortic annulus:   Diameter: 30mm x 25mm   Perimeter: 85mm   Area: 579mm^1   Calcifications: No calcifications   Coronary height: Min Left - 14mm; Min Right - 19mm   Sinotubular height: Left cusp - 22mm; Right cusp - 24mm; Noncoronary cusp - 19mm   LVOT (as measured 3 mm below the annulus):   Diameter: 32mm x 24mm   Area: 527mm^2   Calcifications: No calcifications   Aortic sinus width: Left cusp - 34mm; Right cusp - 31mm; Noncoronary cusp - 33mm   Sinotubular junction width: 28mm x 27mm   Optimum Fluoroscopic Angle for Delivery: LAO 11 CAU 3   Cardiac:   Right atrium: Normal size   Right ventricle: Normal size   Pulmonary arteries: Normal size   Pulmonary veins: Normal configuration  Left atrium: Mild enlargement   Left ventricle: Normal size   Pericardium: Normal thickness   Coronary arteries: Normal origin.  S/p stent in proximal to mid LAD   IMPRESSION: 1. Tricuspid aortic valve with moderate calcifications (AV  calcium  score 1508)   2. Aortic annulus measures 30mm x 25mm in diameter with perimeter 85mm and area 561 mm^2. No annular or LVOT calcifications. Annular measurements suitable for delivery of 29mm Edwards Sapien 3 valve   3.  Sufficient coronary to annulus distance.   4.  Optimum Fluoroscopic Angle for Delivery:  LAO 11 CAU 3   Electronically Signed: By: Lonni Nanas M.D. On: 10/12/2023 16:19        Narrative & Impression  CLINICAL DATA:  Preoperative evaluation, TAVR   EXAM: CT ANGIOGRAPHY CHEST, ABDOMEN AND PELVIS   TECHNIQUE: Multidetector CT imaging through the chest, abdomen and pelvis was performed using the standard protocol during bolus administration of intravenous contrast. Multiplanar reconstructed images and MIPs were obtained and reviewed to evaluate the vascular anatomy.   RADIATION DOSE REDUCTION: This exam was performed according to the departmental dose-optimization program which includes automated exposure control, adjustment of the mA and/or kV according to patient size and/or use of iterative reconstruction technique.   CONTRAST:  OMNIPAQUE  IOHEXOL  350 MG/ML SOLN   COMPARISON:  None Available.   FINDINGS: CTA CHEST FINDINGS   VASCULAR   Aorta: Satisfactory opacification of the aorta. Normal contour and caliber of the thoracic aorta. No evidence of aneurysm, dissection, or other acute aortic pathology.   Cardiovascular: No evidence of pulmonary embolism on limited non-tailored examination. Mild cardiomegaly. Subendocardial fibrofatty scarring of the left ventricular apex in keeping with prior infarction. Three-vessel coronary artery calcifications and stents. No pericardial effusion. Dense aortic valve calcifications mild aortic atherosclerosis.   Review of the MIP images confirms the above findings.   NON VASCULAR   Mediastinum/Nodes: No enlarged mediastinal, hilar, or axillary lymph nodes. Multiple small calcified thyroid   nodules, benign, requiring no specific further follow-up or characterization. Trachea, and esophagus demonstrate no significant findings.   Lungs/Pleura: Lungs are clear. No pleural effusion or pneumothorax.   Musculoskeletal: No chest wall abnormality. No acute osseous findings.   Review of the MIP images confirms the above findings.   CTA ABDOMEN AND PELVIS FINDINGS   VASCULAR   Normal contour and caliber of the abdominal aorta. No evidence of aneurysm, dissection, or other acute aortic pathology. Accessory origin of the left gastric artery with duplicated left renal arteries, solitary right renal artery, and otherwise standard branching pattern of the abdominal aorta. Mild aortic atherosclerosis with minimal tortuosity of the bilateral iliac systems.   Review of the MIP images confirms the above findings.   NON-VASCULAR   Hepatobiliary: No solid liver abnormality is seen. Hepatic steatosis. Small gallstones. No gallbladder wall thickening, or biliary dilatation.   Pancreas: Unremarkable. No pancreatic ductal dilatation or surrounding inflammatory changes.   Spleen: Normal in size without significant abnormality.   Adrenals/Urinary Tract: Adrenal glands are unremarkable. Kidneys are normal, without renal calculi, solid lesion, or hydronephrosis. Bladder is unremarkable.   Stomach/Bowel: Stomach is within normal limits. Appendix appears normal. No evidence of bowel wall thickening, distention, or inflammatory changes.   Lymphatic: No enlarged abdominal or pelvic lymph nodes.   Reproductive: Prostatomegaly.   Other: Evidence of prior inguinal hernia repair.  No ascites.   Musculoskeletal: No acute osseous findings.   IMPRESSION: 1. Normal contour and caliber of the thoracic and abdominal aorta. No evidence of  aneurysm, dissection, or other acute aortic pathology. Mild aortic atherosclerosis with minimal tortuosity of the bilateral iliac systems. Please see  separately reported TAVR evaluation for relevant measurements. 2. Dense aortic valve calcifications in keeping with aortic stenosis. 3. Mild cardiomegaly. Subendocardial fibrofatty scarring of the left ventricular apex in keeping with prior infarction. Three-vessel coronary artery calcifications and stents. 4. Hepatic steatosis. 5. Cholelithiasis. 6. Prostatomegaly.   Aortic Atherosclerosis (ICD10-I70.0).     Electronically Signed   By: Marolyn JONETTA Jaksch M.D.   On: 10/12/2023 17:42        Impression:   This 73 year old gentleman has stage D, severe, symptomatic aortic stenosis with NYHA class II symptoms of exertional shortness of breath associated with some exertional fatigue and substernal chest tightness when he was recently on vacation in [place].  I have personally reviewed his 2D echocardiogram, cardiac catheterization, and CTA studies.  His echocardiogram shows a calcified aortic valve with restricted leaflet mobility and a mean gradient of 38 mmHg, valve area of 0.9 cm, and dimensionless index of 0.23 consistent with severe aortic stenosis.  There is mild aortic insufficiency.  Cardiac catheterization showed nonobstructive coronary disease with patent LAD stents.  The mean aortic valve gradient was 31 mmHg.  I agree that aortic valve replacement is indicated in this patient for relief of the symptoms and to prevent progressive left ventricular dysfunction.  I agree that transcatheter aortic valve replacement is a reasonable option for treating him.  His gated cardiac CTA shows anatomy suitable for TAVR using a 29 mm SAPIEN 3 valve.  His abdominal and pelvic CTA shows adequate pelvic vascular anatomy to allow transfemoral insertion.   The patient was counseled at length regarding treatment alternatives for management of severe symptomatic aortic stenosis. The risks and benefits of surgical intervention has been discussed in detail. Long-term prognosis with medical therapy was discussed.  Alternative approaches such as conventional surgical aortic valve replacement, transcatheter aortic valve replacement, and palliative medical therapy were compared and contrasted at length. This discussion was placed in the context of the patient's own specific clinical presentation and past medical history. All of his questions have been addressed.    Following the decision to proceed with transcatheter aortic valve replacement, a discussion was held regarding what types of management strategies would be attempted intraoperatively in the event of life-threatening complications, including whether or not the patient would be considered a candidate for the use of cardiopulmonary bypass and/or conversion to open sternotomy for attempted surgical intervention.  He has an active overall fairly healthy 73 year old gentleman who would be a candidate for emergent sternotomy to manage any intraoperative complications.  The patient has been advised of a variety of complications that might develop including but not limited to risks of death, stroke, paravalvular leak, aortic dissection or other major vascular complications, aortic annulus rupture, device embolization, cardiac rupture or perforation, mitral regurgitation, acute myocardial infarction, arrhythmia, heart block or bradycardia requiring permanent pacemaker placement, congestive heart failure, respiratory failure, renal failure, pneumonia, infection, other late complications related to structural valve deterioration or migration, or other complications that might ultimately cause a temporary or permanent loss of functional independence or other long term morbidity.  He is in agreement to proceed.     Plan:   Transfemoral TAVR using a Sapien 3 valve.   Dorise LOIS Fellers, MD

## 2023-11-24 ENCOUNTER — Inpatient Hospital Stay (HOSPITAL_COMMUNITY)
Admission: RE | Disposition: A | Discharge status: Home / Self Care | Source: Home / Self Care | Attending: Cardiovascular Disease

## 2023-11-24 ENCOUNTER — Encounter (HOSPITAL_COMMUNITY): Payer: Self-pay | Admitting: Cardiovascular Disease

## 2023-11-24 ENCOUNTER — Inpatient Hospital Stay (HOSPITAL_COMMUNITY)
Admission: RE | Admit: 2023-11-24 | Discharge: 2023-11-25 | DRG: 267 | Disposition: A | Discharge status: Home / Self Care

## 2023-11-24 ENCOUNTER — Other Ambulatory Visit: Payer: Self-pay

## 2023-11-24 ENCOUNTER — Inpatient Hospital Stay (HOSPITAL_COMMUNITY): Payer: Self-pay

## 2023-11-24 ENCOUNTER — Inpatient Hospital Stay (HOSPITAL_COMMUNITY)

## 2023-11-24 ENCOUNTER — Inpatient Hospital Stay (HOSPITAL_COMMUNITY): Payer: Self-pay | Admitting: Physician Assistant

## 2023-11-24 DIAGNOSIS — I255 Ischemic cardiomyopathy: Secondary | ICD-10-CM | POA: Diagnosis present

## 2023-11-24 DIAGNOSIS — Z006 Encounter for examination for normal comparison and control in clinical research program: Secondary | ICD-10-CM

## 2023-11-24 DIAGNOSIS — Z825 Family history of asthma and other chronic lower respiratory diseases: Secondary | ICD-10-CM

## 2023-11-24 DIAGNOSIS — Z87891 Personal history of nicotine dependence: Secondary | ICD-10-CM

## 2023-11-24 DIAGNOSIS — Z955 Presence of coronary angioplasty implant and graft: Secondary | ICD-10-CM

## 2023-11-24 DIAGNOSIS — K219 Gastro-esophageal reflux disease without esophagitis: Secondary | ICD-10-CM | POA: Diagnosis present

## 2023-11-24 DIAGNOSIS — Z79899 Other long term (current) drug therapy: Secondary | ICD-10-CM | POA: Diagnosis not present

## 2023-11-24 DIAGNOSIS — Z5948 Other specified lack of adequate food: Secondary | ICD-10-CM | POA: Diagnosis not present

## 2023-11-24 DIAGNOSIS — I350 Nonrheumatic aortic (valve) stenosis: Secondary | ICD-10-CM

## 2023-11-24 DIAGNOSIS — Z8349 Family history of other endocrine, nutritional and metabolic diseases: Secondary | ICD-10-CM | POA: Diagnosis not present

## 2023-11-24 DIAGNOSIS — E785 Hyperlipidemia, unspecified: Secondary | ICD-10-CM | POA: Diagnosis present

## 2023-11-24 DIAGNOSIS — Z823 Family history of stroke: Secondary | ICD-10-CM | POA: Diagnosis not present

## 2023-11-24 DIAGNOSIS — Z833 Family history of diabetes mellitus: Secondary | ICD-10-CM | POA: Diagnosis not present

## 2023-11-24 DIAGNOSIS — E119 Type 2 diabetes mellitus without complications: Secondary | ICD-10-CM | POA: Diagnosis present

## 2023-11-24 DIAGNOSIS — Z8249 Family history of ischemic heart disease and other diseases of the circulatory system: Secondary | ICD-10-CM | POA: Diagnosis not present

## 2023-11-24 DIAGNOSIS — I252 Old myocardial infarction: Secondary | ICD-10-CM

## 2023-11-24 DIAGNOSIS — Z882 Allergy status to sulfonamides status: Secondary | ICD-10-CM

## 2023-11-24 DIAGNOSIS — I2510 Atherosclerotic heart disease of native coronary artery without angina pectoris: Secondary | ICD-10-CM | POA: Diagnosis present

## 2023-11-24 DIAGNOSIS — Z5941 Food insecurity: Secondary | ICD-10-CM | POA: Diagnosis not present

## 2023-11-24 DIAGNOSIS — I110 Hypertensive heart disease with heart failure: Secondary | ICD-10-CM | POA: Diagnosis present

## 2023-11-24 DIAGNOSIS — Z7902 Long term (current) use of antithrombotics/antiplatelets: Secondary | ICD-10-CM

## 2023-11-24 DIAGNOSIS — Z885 Allergy status to narcotic agent status: Secondary | ICD-10-CM | POA: Diagnosis not present

## 2023-11-24 DIAGNOSIS — I5032 Chronic diastolic (congestive) heart failure: Secondary | ICD-10-CM | POA: Diagnosis present

## 2023-11-24 DIAGNOSIS — Z888 Allergy status to other drugs, medicaments and biological substances status: Secondary | ICD-10-CM

## 2023-11-24 DIAGNOSIS — Z7982 Long term (current) use of aspirin: Secondary | ICD-10-CM | POA: Diagnosis not present

## 2023-11-24 DIAGNOSIS — Z952 Presence of prosthetic heart valve: Secondary | ICD-10-CM | POA: Diagnosis not present

## 2023-11-24 HISTORY — DX: Nonrheumatic aortic (valve) stenosis: I35.0

## 2023-11-24 HISTORY — DX: Personal history of other diseases of the digestive system: Z87.19

## 2023-11-24 HISTORY — DX: Unspecified osteoarthritis, unspecified site: M19.90

## 2023-11-24 HISTORY — PX: INTRAOPERATIVE TRANSTHORACIC ECHOCARDIOGRAM: SHX6523

## 2023-11-24 LAB — ECHOCARDIOGRAM LIMITED
AR max vel: 2.81 cm2
AV Area VTI: 3.01 cm2
AV Area mean vel: 2.8 cm2
AV Mean grad: 4 mmHg
AV Peak grad: 7.2 mmHg
Ao pk vel: 1.34 m/s

## 2023-11-24 LAB — POCT ACTIVATED CLOTTING TIME
Activated Clotting Time: 228 s
Activated Clotting Time: 435 s

## 2023-11-24 LAB — POCT I-STAT, CHEM 8
BUN: 12 mg/dL (ref 8–23)
Calcium, Ion: 1.22 mmol/L (ref 1.15–1.40)
Chloride: 106 mmol/L (ref 98–111)
Creatinine, Ser: 0.7 mg/dL (ref 0.61–1.24)
Glucose, Bld: 127 mg/dL — ABNORMAL HIGH (ref 70–99)
HCT: 39 % (ref 39.0–52.0)
Hemoglobin: 13.3 g/dL (ref 13.0–17.0)
Potassium: 4.1 mmol/L (ref 3.5–5.1)
Sodium: 143 mmol/L (ref 135–145)
TCO2: 22 mmol/L (ref 22–32)

## 2023-11-24 LAB — ABO/RH: ABO/RH(D): B POS

## 2023-11-24 LAB — GLUCOSE, CAPILLARY
Glucose-Capillary: 95 mg/dL (ref 70–99)
Glucose-Capillary: 82 mg/dL (ref 70–99)
Glucose-Capillary: 105 mg/dL — ABNORMAL HIGH (ref 70–99)

## 2023-11-24 LAB — PREPARE RBC (CROSSMATCH)

## 2023-11-24 MED ORDER — TRAMADOL HCL 50 MG PO TABS
50.0000 mg | ORAL_TABLET | ORAL | Status: DC | PRN
  Administered 2023-11-24 – 2023-11-25 (×2): 50 mg via ORAL
  Filled 2023-11-24 (×2): qty 1

## 2023-11-24 MED ORDER — LACTATED RINGERS IV SOLN: INTRAVENOUS | Status: DC | PRN

## 2023-11-24 MED ORDER — PANTOPRAZOLE SODIUM 40 MG PO TBEC
40.0000 mg | DELAYED_RELEASE_TABLET | Freq: Every day | ORAL | Status: DC
  Administered 2023-11-25: 40 mg via ORAL
  Filled 2023-11-24: qty 1

## 2023-11-24 MED ORDER — SODIUM CHLORIDE 0.9% FLUSH: 3.0000 mL | INTRAVENOUS | Status: DC | PRN

## 2023-11-24 MED ORDER — OXYCODONE HCL 5 MG PO TABS: 5.0000 mg | ORAL_TABLET | ORAL | Status: DC | PRN

## 2023-11-24 MED ORDER — NITROGLYCERIN IN D5W 200-5 MCG/ML-% IV SOLN: 0.0000 ug/min | INTRAVENOUS | Status: DC

## 2023-11-24 MED ORDER — CHLORHEXIDINE GLUCONATE 0.12 % MT SOLN
15.0000 mL | Freq: Once | OROMUCOSAL | Status: AC
  Administered 2023-11-24: 15 mL via OROMUCOSAL
  Filled 2023-11-24: qty 15

## 2023-11-24 MED ORDER — NITROGLYCERIN 0.2 MG/ML ON CALL CATH LAB
INTRAVENOUS | Status: DC | PRN
  Administered 2023-11-24: 20 ug via INTRAVENOUS

## 2023-11-24 MED ORDER — ASPIRIN 81 MG PO TBEC
81.0000 mg | DELAYED_RELEASE_TABLET | Freq: Every day | ORAL | Status: DC
  Administered 2023-11-24: 81 mg via ORAL
  Filled 2023-11-24: qty 1

## 2023-11-24 MED ORDER — LIDOCAINE HCL (PF) 1 % IJ SOLN
INTRAMUSCULAR | Status: DC | PRN
  Administered 2023-11-24 (×2): 15 mL

## 2023-11-24 MED ORDER — FENTANYL CITRATE (PF) 250 MCG/5ML IJ SOLN
INTRAMUSCULAR | Status: DC | PRN
  Administered 2023-11-24 (×2): 25 ug via INTRAVENOUS

## 2023-11-24 MED ORDER — ONDANSETRON HCL 4 MG/2ML IJ SOLN: 4.0000 mg | Freq: Four times a day (QID) | INTRAMUSCULAR | Status: DC | PRN

## 2023-11-24 MED ORDER — SODIUM CHLORIDE 0.9 % IV SOLN
INTRAVENOUS | Status: DC | PRN
Start: 2023-11-24 — End: 2023-11-24

## 2023-11-24 MED ORDER — SODIUM CHLORIDE 0.9 % IV SOLN: 250.0000 mL | INTRAVENOUS | Status: DC | PRN

## 2023-11-24 MED ORDER — ATORVASTATIN CALCIUM 10 MG PO TABS
10.0000 mg | ORAL_TABLET | Freq: Every day | ORAL | Status: DC
  Administered 2023-11-24: 10 mg via ORAL
  Filled 2023-11-24: qty 1

## 2023-11-24 MED ORDER — CHLORHEXIDINE GLUCONATE 4 % EX SOLN: 60.0000 mL | Freq: Once | CUTANEOUS | Status: DC

## 2023-11-24 MED ORDER — SODIUM CHLORIDE 0.9 % IV SOLN: INTRAVENOUS | Status: DC | PRN

## 2023-11-24 MED ORDER — ATORVASTATIN CALCIUM 10 MG PO TABS: 10.0000 mg | ORAL_TABLET | Freq: Every day | ORAL | Status: DC

## 2023-11-24 MED ORDER — HEPARIN SODIUM (PORCINE) 1000 UNIT/ML IJ SOLN
INTRAMUSCULAR | Status: DC | PRN
Start: 2023-11-24 — End: 2023-11-24
  Administered 2023-11-24: 25000 [IU] via INTRAVENOUS

## 2023-11-24 MED ORDER — FENTANYL CITRATE (PF) 100 MCG/2ML IJ SOLN
INTRAMUSCULAR | Status: AC
  Filled 2023-11-24: qty 2

## 2023-11-24 MED ORDER — SODIUM CHLORIDE 0.9 % IV SOLN: INTRAVENOUS | Status: AC

## 2023-11-24 MED ORDER — NOREPINEPHRINE 4 MG/250ML-% IV SOLN: 0.0000 ug/min | INTRAVENOUS | Status: DC

## 2023-11-24 MED ORDER — SODIUM CHLORIDE 0.9% FLUSH
3.0000 mL | Freq: Two times a day (BID) | INTRAVENOUS | Status: DC
  Administered 2023-11-25: 3 mL via INTRAVENOUS

## 2023-11-24 MED ORDER — SODIUM CHLORIDE 0.9% IV SOLUTION: Freq: Once | INTRAVENOUS | Status: DC

## 2023-11-24 MED ORDER — CHLORHEXIDINE GLUCONATE 4 % EX SOLN: 30.0000 mL | CUTANEOUS | Status: DC

## 2023-11-24 MED ORDER — ACETAMINOPHEN 650 MG RE SUPP: 650.0000 mg | Freq: Four times a day (QID) | RECTAL | Status: DC | PRN

## 2023-11-24 MED ORDER — HEPARIN (PORCINE) IN NACL 2000-0.9 UNIT/L-% IV SOLN
INTRAVENOUS | Status: DC | PRN
  Administered 2023-11-24: 1000 mL

## 2023-11-24 MED ORDER — MIDAZOLAM HCL 2 MG/2ML IJ SOLN
INTRAMUSCULAR | Status: DC | PRN
  Administered 2023-11-24 (×3): 1 mg via INTRAVENOUS

## 2023-11-24 MED ORDER — ACETAMINOPHEN 325 MG PO TABS
650.0000 mg | ORAL_TABLET | Freq: Four times a day (QID) | ORAL | Status: DC | PRN
  Administered 2023-11-24: 650 mg via ORAL
  Filled 2023-11-24: qty 2

## 2023-11-24 MED ORDER — PHENYLEPHRINE 80 MCG/ML (10ML) SYRINGE FOR IV PUSH (FOR BLOOD PRESSURE SUPPORT)
PREFILLED_SYRINGE | INTRAVENOUS | Status: DC | PRN
  Administered 2023-11-24: 80 ug via INTRAVENOUS

## 2023-11-24 MED ORDER — SODIUM CHLORIDE 0.9 % IV SOLN: INTRAVENOUS | Status: DC

## 2023-11-24 MED ORDER — IODIXANOL 320 MG/ML IV SOLN
INTRAVENOUS | Status: DC | PRN
  Administered 2023-11-24: 50 mL

## 2023-11-24 MED ORDER — PROTAMINE SULFATE 10 MG/ML IV SOLN
INTRAVENOUS | Status: DC | PRN
  Administered 2023-11-24 (×2): 50 mg via INTRAVENOUS

## 2023-11-24 MED ORDER — PROPOFOL 10 MG/ML IV BOLUS
INTRAVENOUS | Status: DC | PRN
  Administered 2023-11-24: 50 ug/kg/min via INTRAVENOUS

## 2023-11-24 MED ORDER — MIDAZOLAM HCL 2 MG/2ML IJ SOLN
INTRAMUSCULAR | Status: AC
  Filled 2023-11-24: qty 2

## 2023-11-24 MED ORDER — CEFAZOLIN SODIUM-DEXTROSE 2-4 GM/100ML-% IV SOLN
2.0000 g | Freq: Three times a day (TID) | INTRAVENOUS | Status: AC
  Administered 2023-11-24 (×2): 2 g via INTRAVENOUS
  Filled 2023-11-24 (×2): qty 100

## 2023-11-24 NOTE — Progress Notes (Signed)
  [facility]   MULTIDISCIPLINARY HEART VALVE TEAM  Patient doing well s/p TAVR. He is hemodynamically stable. Groin sites stable. ECG with sinus and no high grade block. Transferred from cath lab holding to 4E. Early ambulation after bedrest completed and hopeful discharge over the next 24-48 hours.   Lamarr Hummer PA-C  [facility]  Pager [phone]

## 2023-11-24 NOTE — Transfer of Care (Signed)
 Immediate Anesthesia Transfer of Care Note  Patient: Patrick Gordon, Dr.  Harrie) Performed: Transcatheter Aortic Valve Replacement, Transfemoral ECHOCARDIOGRAM, TRANSTHORACIC  Patient Location: Cath Lab  Anesthesia Type:MAC  Level of Consciousness: drowsy and patient cooperative  Airway & Oxygen Therapy: Patient Spontanous Breathing and Patient connected to nasal cannula oxygen  Post-op Assessment: Report given to RN, Post -op Vital signs reviewed and stable, and Patient moving all extremities X 4  Post vital signs: Reviewed and stable  Last Vitals:  Vitals Value Taken Time  BP 99/51   Temp    Pulse 76 11/24/23 12:12  Resp 13   SpO2 94 % 11/24/23 12:12  Vitals shown include unfiled device data.  Last Pain:  Vitals:   11/24/23 0706  TempSrc:   PainSc: 0-No pain      Patients Stated Pain Goal: 0 (11/24/23 0706)  Complications: There were no known notable events for this encounter.

## 2023-11-24 NOTE — Op Note (Signed)
 [facility]   MULTIDISCIPLINARY HEART VALVE TEAM   TAVR OPERATIVE NOTE   Date of Procedure:  11/24/2023  Preoperative Diagnosis: Severe Aortic Stenosis   Postoperative Diagnosis: Same   Procedure:   Transcatheter Aortic Valve Replacement - Percutaneous Transfemoral Approach  Edwards Sapien 3 Ultra Resilia THV (size 26 mm, serial # [id number])   Co-Surgeons:  Con Clunes, MD, Dorise Fellers, MD, and Ozell Fell, MD  Anesthesiologist:  Lonni Custard, MD  Echocardiographer:  Jerel Balding, MD  Pre-operative Echo Findings: Severe aortic stenosis Normal left ventricular systolic function  Post-operative Echo Findings: No paravalvular leak Normal/unchanged left ventricular systolic function  BRIEF CLINICAL NOTE AND INDICATIONS FOR SURGERY  73 year old male with severe, stage D1 symptomatic aortic stenosis with [place]  Heart Association functional class II symptoms of exertional dyspnea and chest tightness.  He underwent appropriate preoperative imaging studies including diagnostic cardiac catheterization, 2D echo, and CTA imaging.  Findings were all consistent with severe aortic stenosis.  He was found to have suitable anatomy for a 26 mm Edwards SAPIEN 3 transcatheter heart valve.  After heart team review of his case, the presents today for TAVR via a planned transfemoral approach.  During the course of the patient's preoperative work up they have been evaluated comprehensively by a multidisciplinary team of specialists coordinated through the Multidisciplinary Heart Valve Clinic in the [facility].  They have been demonstrated to suffer from symptomatic severe aortic stenosis as noted above. The patient has been counseled extensively as to the relative risks and benefits of all options for the treatment of severe aortic stenosis including long term medical therapy, conventional surgery for aortic valve replacement, and transcatheter aortic  valve replacement.  The patient has been independently evaluated in formal cardiac surgical consultation by Dr Fellers, who deemed the patient appropriate for TAVR. Based upon review of all of the patient's preoperative diagnostic tests they are felt to be candidate for transcatheter aortic valve replacement using the transfemoral approach as an alternative to conventional surgery.    Following the decision to proceed with transcatheter aortic valve replacement, a discussion has been held regarding what types of management strategies would be attempted intraoperatively in the event of life-threatening complications, including whether or not the patient would be considered a candidate for the use of cardiopulmonary bypass and/or conversion to open sternotomy for attempted surgical intervention.  The patient has been advised of a variety of complications that might develop peculiar to this approach including but not limited to risks of death, stroke, paravalvular leak, aortic dissection or other major vascular complications, aortic annulus rupture, device embolization, cardiac rupture or perforation, acute myocardial infarction, arrhythmia, heart block or bradycardia requiring permanent pacemaker placement, congestive heart failure, respiratory failure, renal failure, pneumonia, infection, other late complications related to structural valve deterioration or migration, or other complications that might ultimately cause a temporary or permanent loss of functional independence or other long term morbidity.  The patient provides full informed consent for the procedure as described and all questions were answered preoperatively.  DETAILS OF THE OPERATIVE PROCEDURE  PREPARATION:   The patient is brought to the operating room on the above mentioned date and central monitoring was established by the anesthesia team including placement of a radial arterial line. The patient is placed in the supine position on the  operating table.  Intravenous antibiotics are administered. The patient is monitored closely throughout the procedure under conscious sedation.  Baseline transthoracic echocardiogram is performed. The patient's chest, abdomen,  both groins, and both lower extremities are prepared and draped in a sterile manner. A time out procedure is performed.   PERIPHERAL ACCESS:   Using ultrasound guidance, femoral arterial and venous access is obtained with placement of 6 Fr sheaths on the left side.  US  images are digitally captured and stored in the patient's chart. A pigtail diagnostic catheter was passed through the femoral arterial sheath under fluoroscopic guidance into the aortic root.  A temporary transvenous pacemaker catheter was passed through the femoral venous sheath under fluoroscopic guidance into the right ventricle.  The pacemaker was tested to ensure stable lead placement and pacemaker capture. Aortic root angiography was performed in order to determine the optimal angiographic angle for valve deployment.  TRANSFEMORAL ACCESS:  A micropuncture technique is used to access the right femoral artery under fluoroscopic and ultrasound guidance.  2 Perclose devices are deployed at 10' and 2' positions to 'PreClose' the femoral artery. An 8 French sheath is placed and then an Amplatz Superstiff wire is advanced through the sheath. This is changed out for a 14 French transfemoral E-Sheath after progressively dilating over the Superstiff wire.  An AL-2 catheter was used to direct a straight-tip exchange length wire across the native aortic valve into the left ventricle. This was exchanged out for a pigtail catheter and position was confirmed in the LV apex. Simultaneous LV and Ao pressures were recorded.  The pigtail catheter was exchanged for a Safari wire in the LV apex.    BALLOON AORTIC VALVULOPLASTY:  Not performed  TRANSCATHETER HEART VALVE DEPLOYMENT:  An Edwards Sapien 3 Ultra Resilia transcatheter  heart valve (size 26 mm +2 cc) was prepared and crimped per manufacturer's guidelines, and the proper orientation of the valve is confirmed on the [employer] delivery system. The valve was advanced through the introducer sheath using normal technique until in an appropriate position in the abdominal aorta beyond the sheath tip. The balloon was then retracted and using the fine-tuning wheel was centered on the valve. The valve was then advanced across the aortic arch using appropriate flexion of the catheter. The valve was carefully positioned across the aortic valve annulus. The Commander catheter was retracted using normal technique. Once final position of the valve has been confirmed by angiographic assessment, the valve is deployed while temporarily holding ventilation and during rapid ventricular pacing to maintain systolic blood pressure < 50 mmHg and pulse pressure < 10 mmHg. The balloon inflation is held for >3 seconds after reaching full deployment volume. Once the balloon has fully deflated the balloon is retracted into the ascending aorta and valve function is assessed using echocardiography. The patient's hemodynamic recovery following valve deployment is good.  The deployment balloon and guidewire are both removed. Echo demostrated acceptable post-procedural gradients, stable mitral valve function, and no aortic insufficiency.    PROCEDURE COMPLETION:  The sheath was removed and femoral artery closure is performed using the 2 previously deployed Perclose devices.  Protamine  is administered once femoral arterial repair was complete. The site is clear with no evidence of bleeding or hematoma after the sutures are tightened. The temporary pacemaker and pigtail catheters are removed. Mynx closure is used for contralateral femoral arterial hemostasis for the 6 Fr sheath.  The patient tolerated the procedure well and is transported to the recovery area in stable condition. There were no immediate  intraoperative complications. All sponge instrument and needle counts are verified correct at completion of the operation.   The patient received a total of 50  mL of intravenous contrast during the procedure.  EBL: minimal  LVEDP: 16 mmHg   Ozell Fell, MD 11/24/2023 12:41 PM

## 2023-11-24 NOTE — Anesthesia Preprocedure Evaluation (Addendum)
 Anesthesia Evaluation  Patient identified by MRN, date of birth, ID band Patient awake    Reviewed: Allergy & Precautions, NPO status , Patient's Chart, lab work & pertinent test results  History of Anesthesia Complications Negative for: history of anesthetic complications  Airway Mallampati: III  TM Distance: >3 FB Neck ROM: Limited    Dental  (+) Dental Advisory Given, Teeth Intact, Implants,    Pulmonary neg shortness of breath, neg sleep apnea, neg COPD, neg recent URI, former smoker   breath sounds clear to auscultation       Cardiovascular + CAD, + Past MI and + Cardiac Stents  + dysrhythmias + Valvular Problems/Murmurs  Rhythm:Regular + Systolic murmurs  1. Progression of AV disease since prior echocardiogram. DVI 0.24,  suspect borderline severe. The aortic valve is calcified. There is severe  calcifcation of the aortic valve. There is moderate thickening of the  aortic valve. Aortic valve regurgitation  is mild. Moderate to severe aortic valve stenosis. Aortic valve area, by  VTI measures 0.89 cm. Aortic valve mean gradient measures 38.0 mmHg.  Aortic valve Vmax measures 3.78 m/s.   2. Left ventricular ejection fraction, by estimation, is 60 to 65%. Left  ventricular ejection fraction by 3D volume is 70 %. The left ventricle has  normal function. The left ventricle has no regional wall motion  abnormalities. Left ventricular diastolic   parameters were normal. The average left ventricular global longitudinal  strain is -20.3 %. The global longitudinal strain is normal.   3. Right ventricular systolic function is normal. The right ventricular  size is normal. There is normal pulmonary artery systolic pressure.   4. The mitral valve is normal in structure. Trivial mitral valve  regurgitation. No evidence of mitral stenosis.   5. The inferior vena cava is normal in size with greater than 50%  respiratory variability,  suggesting right atrial pressure of 3 mmHg.     Neuro/Psych neg Seizures    GI/Hepatic Neg liver ROS, hiatal hernia,GERD  Medicated and Controlled,,  Endo/Other  diabetes    Renal/GU Lab Results      Component                Value               Date                      NA                       137                 11/20/2023                K                        4.0                 11/20/2023                CO2                      23                  11/20/2023                GLUCOSE  105 (H)             11/20/2023                BUN                      12                  11/20/2023                CREATININE               0.82                11/20/2023                CALCIUM                   9.1                 11/20/2023                EGFR                     96                  09/28/2023                GFRNONAA                 >60                 11/20/2023                Musculoskeletal  (+) Arthritis ,    Abdominal   Peds  Hematology Lab Results      Component                Value               Date                      WBC                      5.5                 11/20/2023                HGB                      14.2                11/20/2023                HCT                      42.4                11/20/2023                MCV                      88.5                11/20/2023                PLT  187                 11/20/2023           Brilinta  held  asa   Anesthesia Other Findings 7/25:    Prox LAD to Mid LAD lesion is 10% stenosed.   Prox RCA lesion is 40% stenosed.   Mid RCA lesion is 20% stenosed.   2nd Mrg lesion is 20% stenosed.   Ost LM lesion is 35% stenosed.   The left ventricular systolic function is normal.   LV end diastolic pressure is mildly elevated.   The left ventricular ejection fraction is 55-65% by visual estimate.   Hemodynamic findings consistent with mild pulmonary  hypertension.   There is moderate aortic valve stenosis.   1. Nonobstructive CAD 2. Moderate to severe aortic stenosis. Mean AV gradient 31 mm Hg. AVA 1.2 cm squared, index 0.6 3. Mildly elevated LV filling pressures. LVEDP 17 mm Hg. PCWP 23/28, mean 19 mm Hg 4. Mild pulmonary HTN. PAP 38/17, Mean 30 mm Hg 5. Cardiac output 7.03 L/min, index 3.55   Reproductive/Obstetrics                              Anesthesia Physical Anesthesia Plan  ASA: 4  Anesthesia Plan: MAC   Post-op Pain Management: Minimal or no pain anticipated   Induction: Intravenous  PONV Risk Score and Plan: 1 and Propofol  infusion and Ondansetron   Airway Management Planned: Nasal Cannula, Natural Airway and Simple Face Mask  Additional Equipment: None  Intra-op Plan:   Post-operative Plan:   Informed Consent: I have reviewed the patients History and Physical, chart, labs and discussed the procedure including the risks, benefits and alternatives for the proposed anesthesia with the patient or authorized representative who has indicated his/her understanding and acceptance.     Dental advisory given  Plan Discussed with: CRNA  Anesthesia Plan Comments:          Anesthesia Quick Evaluation

## 2023-11-24 NOTE — Discharge Instructions (Addendum)
 ACTIVITY AND EXERCISE  Daily activity and exercise are an important part of your recovery. People recover at different rates depending on their general health and type of valve procedure.  Most people recovering from TAVR feel better relatively quickly   No lifting, pushing, pulling more than 10 pounds (examples to avoid: groceries, vacuuming, gardening, golfing):             - For one week with a procedure through the groin.             - For six weeks for procedures through the chest wall or neck. NOTE: You will typically see one of our providers 7-14 days after your procedure to discuss WHEN TO RESUME the above activities.      DRIVING  Do not drive until you are seen for follow up and cleared by a provider. Generally, we ask patient to not drive for 1 week after their procedure.  If you have been told by your doctor in the past that you may not drive, you must talk with him/her before you begin driving again.   DRESSING  Groin site: you may leave the clear dressing over the site until follow up or remove 3-5 days post procedure.   HYGIENE  If you had a femoral (leg) procedure, you may take a shower when you return home. After the shower, pat the site dry. Do NOT use powder, oils or lotions in your groin area until the site has completely healed.   If you had a chest procedure, you may shower when you return home unless specifically instructed not to by your discharging practitioner.             - DO NOT scrub incision; pat dry with a towel.             - DO NOT apply any lotions, oils, powders to the incision.             - No tub baths / swimming for at least 2 weeks.  If you notice any fevers, chills, increased pain, swelling, bleeding or pus, please contact your provider.   ADDITIONAL INFORMATION  If you are going to have an upcoming dental procedure, please contact our office as you will require antibiotics ahead of time to prevent infection on your heart valve.    If you have any  questions or concerns you can call the structural heart phone during normal business hours 8am-4pm. If you have an urgent need after hours or weekends please call [phone] to talk to the on call provider for general cardiology. If you have an emergency that requires immediate attention, please call 911.    After TAVR Checklist  Check  Test Description   Follow up appointment in 1-2 weeks  You will see our structural heart advanced practice provider. Your incision sites will be checked and you will be cleared to drive and resume all normal activities if you are doing well.     1 month echo and follow up (23-75 days) You will have an echo to check on your new heart valve and be seen back in the office by a structural heart advanced practice provider.   Follow up with your primary cardiologist You will need to be seen by your primary cardiologist in the following 3-6 months after your 1 month appointment in the valve clinic.    1 year echo and follow up (60 days +/- the anniversary of your TAVR) You will have another echo  to check on your heart valve after 1 year and be seen back in the office by a structural heart advanced practice provider. This your last structural heart visit.   Bacterial endocarditis prophylaxis  You will have to take antibiotics for the rest of your life before all dental procedures (even teeth cleanings) to protect your heart valve. Antibiotics are also required before some surgeries. Please check with your cardiologist before scheduling any surgeries. Also, please make sure to tell us  if you have a penicillin allergy as you will require an alternative antibiotic.

## 2023-11-24 NOTE — Interval H&P Note (Signed)
 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  The various methods of treatment have been discussed with the patient and family. After consideration of risks, benefits and other options for treatment, the patient has consented to  Procedure(s): Transcatheter Aortic Valve Replacement, Transfemoral (N/A) ECHOCARDIOGRAM, TRANSTHORACIC (N/A) as a surgical intervention.  The patient's history has been reviewed, patient examined, no change in status, stable for surgery.  I have reviewed the patient's chart and labs.  Questions were answered to the patient's satisfaction.     Kwabena Strutz K Jedaiah Rathbun

## 2023-11-24 NOTE — Discharge Summary (Incomplete)
 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; DOB: [birth year]  Admit date: 11/24/2023 Discharge date: 11/24/2023  PCP:  Patrick Antu, FNP  [facility] Cardiologist:  None  [facility] Structural heart: None [facility] Electrophysiologist:  None   Discharge Diagnoses    Principal Problem:   S/P TAVR (transcatheter aortic valve replacement) Active Problems:   GERD (gastroesophageal reflux disease)   Dyslipidemia   CAD (coronary artery disease)   Diabetes mellitus without complication (HCC)   History of acute anterior wall MI   Allergies Allergies  Allergen Reactions   Morphine  And Codeine Nausea And Vomiting   Sulfa Antibiotics Rash    FIXED DRUG ERUPTION   Verapamil  Rash    Blisters at cath site    Diagnostic Studies/Procedures    TAVR OPERATIVE NOTE     Date of Procedure:                11/24/2023   Preoperative Diagnosis:      Severe Aortic Stenosis    Postoperative Diagnosis:    Same    Procedure:        Transcatheter Aortic Valve Replacement - Percutaneous Transfemoral Approach             Edwards Sapien 3 Ultra Resilia THV (size 26 mm, serial # [id number])              Co-Surgeons:                        Patrick Clunes, MD, Patrick Fellers, MD, and Patrick Fell, MD   Anesthesiologist:                  Patrick Custard, MD   Echocardiographer:              Patrick Balding, MD   Pre-operative Echo Findings: Severe aortic stenosis Normal left ventricular systolic function   Post-operative Echo Findings: No paravalvular leak Normal/unchanged left ventricular systolic function  _____________   Echo 11/24/23: completed but pending formal read at the time of discharge   History of Present Illness     Patrick Gordon, 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   He just returned from a several month trip touring [place] and during that trip he noticed increased exertional shortness of breath and fatigue particularly with doing a lot of walking up hills associated with some exertional substernal chest tightness. Echo in 09/2023 showed EF 60% and severe AS with mean grad 38 mmHg & AVA 0.89 cm2. [facility] 10/05/23 showed nonobstructive coronary disease with patent LAD stents. The mean aortic valve gradient was 31 mmHg.   The patient was evaluated by the multidisciplinary valve team and felt to have severe, symptomatic aortic stenosis and to be a suitable candidate for TAVR, which was set up for 11/24/23.   Hospital Course     Consultants: none   Severe AS:  -- S/p successful TAVR with a 26 mm +2CC Edwards Sapien 3 Ultra Resilia THV via the TF approach on 11/24/23.  -- Post operative echo completed but pending formal read. -- Groin sites are stable.  -- ECG with *** and no high grade heart block. -- Continue  Brilinta  60mg  BID. Will stop aspirin  81mg  daily.  -- Met with cardiac rehab to discuss CRP phase II.  -- Plan for discharge home today with close follow up in the outpatient setting.   Ischemic cardiomyopathy/HFimpEF: -- Initial Ejection fraction 25-35% with MI. EF subsequently 60%.  -- Continue enalapril  2.5 mg daily and Coreg  12.5mg  BID.    HLD.  -- Continue atorvastatin  10mg  daily.    _____________  Discharge Vitals Blood pressure (!) 99/57, pulse 70, temperature (!) 97.5 F (36.4 C), temperature source Oral, resp. rate 12, height 5' 8 (1.727 m), weight 81.6 kg, SpO2 94%.  Filed Weights   11/23/23 1400 11/24/23 0702  Weight: 83.2 kg 81.6 kg     GEN: Well nourished, well developed in no acute distress NECK: No JVD CARDIAC: ***RRR, no murmurs, rubs, gallops RESPIRATORY:  Clear to auscultation without rales, wheezing or rhonchi  ABDOMEN: Soft, non-tender,  non-distended EXTREMITIES:  No edema; No deformity.  Groin sites clear without hematoma or ecchymosis. ****   Disposition   Pt is being discharged home today in good condition.  Follow-up Plans & Appointments     Follow-up Information     Patrick Patrick SAUNDERS, PA-C. Go on 12/02/2023.   Specialties: Cardiology, Radiology Why: @ 12 noon, please arrive at least 20 minutes early. Contact information: [address] [place] [address] [phone]                  Discharge Medications   Allergies as of 11/24/2023       Reactions   Morphine  And Codeine Nausea And Vomiting   Sulfa Antibiotics Rash   FIXED DRUG ERUPTION   Verapamil  Rash   Blisters at cath site     Med Rec must be completed prior to using this [facility]***           Outstanding Labs/Studies   ***  ______________________  Duration of Discharge Encounter: APP Time: *** minutes    Signed, Patrick Sebastian, PA-C 11/24/2023, 12:47 PM [phone]

## 2023-11-24 NOTE — Op Note (Signed)
 [facility]   MULTIDISCIPLINARY HEART VALVE TEAM   TAVR OPERATIVE NOTE   Date of Procedure:  11/24/2023  Preoperative Diagnosis: Severe Aortic Stenosis   Postoperative Diagnosis: Same   Procedure:   Transcatheter Aortic Valve Replacement - Percutaneous Right Transfemoral Approach  Edwards Sapien 3 Ultra Resilia (size 26 mm + 2 cc, model # 9755RSL, serial #[id number])   Co-Surgeons:  Con Clunes, MD, Dorise Fellers, MD and Ozell Fell, MD  Anesthesiologist:  C. Leopoldo, MD  Echocardiographer:  CHRISTELLA. Croitoru, MD   Pre-operative Echo Findings: Severe aortic stenosis Normal left ventricular systolic function  Post-operative Echo Findings: No paravalvular leak Normal left ventricular systolic function   BRIEF CLINICAL NOTE AND INDICATIONS FOR SURGERY  This 73 year old gentleman has stage D, severe, symptomatic aortic stenosis with NYHA class II symptoms of exertional shortness of breath associated with some exertional fatigue and substernal chest tightness when he was recently on vacation in [place].  I have personally reviewed his 2D echocardiogram, cardiac catheterization, and CTA studies.  His echocardiogram shows a calcified aortic valve with restricted leaflet mobility and a mean gradient of 38 mmHg, valve area of 0.9 cm, and dimensionless index of 0.23 consistent with severe aortic stenosis.  There is mild aortic insufficiency.  Cardiac catheterization showed nonobstructive coronary disease with patent LAD stents.  The mean aortic valve gradient was 31 mmHg.  I agree that aortic valve replacement is indicated in this patient for relief of the symptoms and to prevent progressive left ventricular dysfunction.  I agree that transcatheter aortic valve replacement is a reasonable option for treating him.  His gated cardiac CTA shows anatomy suitable for TAVR using a 29 mm SAPIEN 3 valve.  His abdominal and pelvic CTA shows adequate pelvic vascular anatomy to allow transfemoral  insertion.   The patient was counseled at length regarding treatment alternatives for management of severe symptomatic aortic stenosis. The risks and benefits of surgical intervention has been discussed in detail. Long-term prognosis with medical therapy was discussed. Alternative approaches such as conventional surgical aortic valve replacement, transcatheter aortic valve replacement, and palliative medical therapy were compared and contrasted at length. This discussion was placed in the context of the patient's own specific clinical presentation and past medical history. All of his questions have been addressed.    Following the decision to proceed with transcatheter aortic valve replacement, a discussion was held regarding what types of management strategies would be attempted intraoperatively in the event of life-threatening complications, including whether or not the patient would be considered a candidate for the use of cardiopulmonary bypass and/or conversion to open sternotomy for attempted surgical intervention.  He has an active overall fairly healthy 73 year old gentleman who would be a candidate for emergent sternotomy to manage any intraoperative complications.  The patient has been advised of a variety of complications that might develop including but not limited to risks of death, stroke, paravalvular leak, aortic dissection or other major vascular complications, aortic annulus rupture, device embolization, cardiac rupture or perforation, mitral regurgitation, acute myocardial infarction, arrhythmia, heart block or bradycardia requiring permanent pacemaker placement, congestive heart failure, respiratory failure, renal failure, pneumonia, infection, other late complications related to structural valve deterioration or migration, or other complications that might ultimately cause a temporary or permanent loss of functional independence or other long term morbidity.  He has an upcoming appointment  with Dr. Peter [place] and would like to discuss treatment of his aortic stenosis with him before making a decision about proceeding with TAVR.  DETAILS OF THE OPERATIVE PROCEDURE  PREPARATION:    The patient was brought to the operating room on the above mentioned date and appropriate monitoring was established by the anesthesia team. The patient was placed in the supine position on the operating table.  Intravenous antibiotics were administered. The patient was monitored closely throughout the procedure under conscious sedation.  Baseline transthoracic echocardiogram was performed. The patient's abdomen and both groins were prepped and draped in a sterile manner. A time out procedure was performed.   PERIPHERAL ACCESS:    Using the modified Seldinger technique, femoral arterial and venous access was obtained with placement of 6 Fr sheaths on the left side.  A pigtail diagnostic catheter was passed through the left arterial sheath under fluoroscopic guidance into the aortic root.  A temporary transvenous pacemaker catheter was passed through the left femoral venous sheath under fluoroscopic guidance into the right ventricle.  The pacemaker was tested to ensure stable lead placement and pacemaker capture. Aortic root angiography was performed in order to determine the optimal angiographic angle for valve deployment.   TRANSFEMORAL ACCESS:   Percutaneous transfemoral access and sheath placement was performed using ultrasound guidance.  The right common femoral artery was cannulated using a micropuncture needle and appropriate location was verified using hand injection angiogram.  A pair of Abbott Perclose percutaneous closure devices were placed and a 6 French sheath replaced into the femoral artery.  The patient was heparinized systemically and ACT verified > 250 seconds.    A 14 Fr transfemoral E-sheath was introduced into the right common femoral artery after progressively dilating over an  Amplatz superstiff wire. An AL-2 catheter was used to direct a straight-tip exchange length wire across the native aortic valve into the left ventricle. This was exchanged out for a pigtail catheter and position was confirmed in the LV apex. Simultaneous LV and Ao pressures were recorded.  The LVEDP was 18 mmHg.  The pigtail catheter was exchanged for a Safari wire in the LV apex.   TRANSCATHETER HEART VALVE DEPLOYMENT:   An Edwards Sapien 3 Ultra transcatheter heart valve (size 26+2 mm) was prepared and crimped per manufacturer's guidelines, and the proper orientation of the valve was confirmed on the [employer] delivery system. The valve was advanced through the introducer sheath using normal technique until in an appropriate position in the abdominal aorta beyond the sheath tip. The balloon was then retracted and using the fine-tuning wheel was centered on the valve. The valve was then advanced across the aortic arch using appropriate flexion of the catheter. The valve was carefully positioned across the aortic valve annulus. The Commander catheter was retracted using normal technique. Once final position of the valve was confirmed by angiographic assessment, the valve was deployed during rapid ventricular pacing to maintain systolic blood pressure < 50 mmHg and pulse pressure < 10 mmHg. The balloon inflation was held for >3 seconds after reaching full deployment volume. Once the balloon was fully deflated the balloon was retracted into the ascending aorta and valve function was assessed using echocardiography. There was felt to be no paravalvular leak and no central aortic insufficiency.  The patient's hemodynamic recovery following valve deployment was good.  The deployment balloon and guidewire were both removed.    PROCEDURE COMPLETION:   The sheath was removed and femoral artery closure performed.  Protamine  was administered once femoral arterial repair was complete. The temporary pacemaker,  pigtail catheter and femoral sheaths were removed with manual pressure used for venous hemostasis.  A Mynx femoral closure device was utilized following removal of the diagnostic sheath in the left femoral artery.  The patient tolerated the procedure well and was transported to the cath lab recovery area in stable condition. There were no immediate intraoperative complications. All sponge instrument and needle counts were verified correct at completion of the operation.   No blood products were administered during the operation.  The patient received a total of 50 mL of intravenous contrast during the procedure.   Con GORMAN Clunes, MD 11/24/2023 12:16 PM

## 2023-11-24 NOTE — Progress Notes (Signed)
 Site area: Left fem vein Site Prior to Removal:  Level 0 Pressure Applied For: Manual:   yes Patient Status During Pull:  stable Post Pull Site:  Level 0 Post Pull Instructions Given:  yes with teach back Post Pull Pulses Present: +1-2 DP Dressing Applied:  gauze wth tegaderm Bedrest begins @ 1200 when arterial sheaths removed s/p TAVR

## 2023-11-25 ENCOUNTER — Encounter (HOSPITAL_COMMUNITY): Payer: Self-pay | Admitting: Cardiovascular Disease

## 2023-11-25 ENCOUNTER — Ambulatory Visit: Payer: Self-pay | Admitting: Family

## 2023-11-25 ENCOUNTER — Inpatient Hospital Stay (HOSPITAL_COMMUNITY)

## 2023-11-25 DIAGNOSIS — I350 Nonrheumatic aortic (valve) stenosis: Secondary | ICD-10-CM

## 2023-11-25 DIAGNOSIS — Z952 Presence of prosthetic heart valve: Secondary | ICD-10-CM

## 2023-11-25 LAB — ECHOCARDIOGRAM COMPLETE
AR max vel: 4.19 cm2
AV Area VTI: 4.14 cm2
AV Area mean vel: 4.16 cm2
AV Mean grad: 6 mmHg
AV Peak grad: 8.7 mmHg
Ao pk vel: 1.48 m/s
Area-P 1/2: 3.99 cm2
Calc EF: 60 %
Height: 68 in
MV VTI: 3.4 cm2
S' Lateral: 2.82 cm
Single Plane A2C EF: 57.1 %
Single Plane A4C EF: 63.9 %
Weight: 2895.96 [oz_av]

## 2023-11-25 LAB — CBC
HCT: 37.2 % — ABNORMAL LOW (ref 39.0–52.0)
Hemoglobin: 12.5 g/dL — ABNORMAL LOW (ref 13.0–17.0)
MCH: 30 pg (ref 26.0–34.0)
MCHC: 33.6 g/dL (ref 30.0–36.0)
MCV: 89.4 fL (ref 80.0–100.0)
Platelets: 121 K/uL — ABNORMAL LOW (ref 150–400)
RBC: 4.16 MIL/uL — ABNORMAL LOW (ref 4.22–5.81)
RDW: 13.1 % (ref 11.5–15.5)
WBC: 5.6 K/uL (ref 4.0–10.5)
nRBC: 0 % (ref 0.0–0.2)

## 2023-11-25 LAB — BASIC METABOLIC PANEL WITH GFR
Anion gap: 8 (ref 5–15)
BUN: 13 mg/dL (ref 8–23)
CO2: 23 mmol/L (ref 22–32)
Calcium: 8.3 mg/dL — ABNORMAL LOW (ref 8.9–10.3)
Chloride: 109 mmol/L (ref 98–111)
Creatinine, Ser: 0.81 mg/dL (ref 0.61–1.24)
GFR, Estimated: >60 mL/min (ref >60)
Glucose, Bld: 88 mg/dL (ref 70–99)
Potassium: 3.8 mmol/L (ref 3.5–5.1)
Sodium: 140 mmol/L (ref 135–145)

## 2023-11-25 LAB — GLUCOSE, CAPILLARY: Glucose-Capillary: 115 mg/dL — ABNORMAL HIGH (ref 70–99)

## 2023-11-25 LAB — MAGNESIUM: Magnesium: 1.6 mg/dL — ABNORMAL LOW (ref 1.7–2.4)

## 2023-11-25 MED FILL — Fentanyl Citrate Preservative Free (PF) Inj 100 MCG/2ML: INTRAMUSCULAR | Qty: 1 | Status: AC

## 2023-11-25 NOTE — Progress Notes (Signed)
   11/25/23 1021  TOC Brief Assessment  Insurance and Status Reviewed  Patient has primary care physician Yes  Home environment has been reviewed home w/ wife  Prior level of function: self/independent  Prior/Current Home Services No current home services  Social Drivers of Health Review SDOH reviewed no interventions necessary  Readmission risk has been reviewed Yes  Transition of care needs no transition of care needs at this time    Pt s/p TAVR, stable for transition home today. Wife to transport home. Pt is active and independent at baseline. No HH or DME needs noted.

## 2023-11-25 NOTE — Progress Notes (Addendum)
 Discussed with pt and wife restrictions, walking for exercise, and CRPII. Pt receptive, eager to d/c. Encouraged him to go for a walk. He did CRPII in the past and not interested now. He walks 4-5 miles every other day. Encouraged him to build back up to that. [id number] Aliene Aris BS, ACSM-CEP 11/25/2023 10:15 AM  Pt called me back to room and has decided he would like to be referred to CRPII. Will refer to [facility]. Aliene Aris BS, ACSM-CEP 11/25/2023 10:40 AM

## 2023-11-25 NOTE — Progress Notes (Signed)
 1 Day Post-Op Procedure(s) (LRB): Transcatheter Aortic Valve Replacement, Transfemoral (N/A) ECHOCARDIOGRAM, TRANSTHORACIC (N/A) Subjective: No issues overnight, just reports headache which is improved with tramadol  HDS, groins stable Has been ambulating without issues No rhythm issues  Objective: Vital signs in last 24 hours: Temp:  [97.5 F (36.4 C)-98.6 F (37 C)] 98.1 F (36.7 C) (09/10 0401) Pulse Rate:  [57-114] 68 (09/09 1300) Cardiac Rhythm: Normal sinus rhythm (09/09 2028) Resp:  [12-19] 19 (09/10 0401) BP: (92-131)/(41-76) 125/75 (09/10 0401) SpO2:  [92 %-97 %] 93 % (09/09 1300) Weight:  [82.1 kg] 82.1 kg (09/10 0414)  Hemodynamic parameters for last 24 hours:    Intake/Output from previous day: 09/09 0701 - 09/10 0700 In: 2096.5 [P.O.:100; I.V.:1696.4; IV Piggyback:300.1] Out: -  Intake/Output this shift: No intake/output data recorded.  Physical Exam: General - Resting comfortably in bed CV - RRR Resp - Unlabored on RA Abd - Soft, ND/NT Ext - No edema, bilateral groins soft, no hematoma  Lab Results: Recent Labs    11/24/23 1206 11/25/23 0257  WBC  --  5.6  HGB 13.3 12.5*  HCT 39.0 37.2*  PLT  --  121*   BMET:  Recent Labs    11/24/23 1206 11/25/23 0257  NA 143 140  K 4.1 3.8  CL 106 109  CO2  --  23  GLUCOSE 127* 88  BUN 12 13  CREATININE 0.70 0.81  CALCIUM   --  8.3*    PT/INR: No results for input(s): LABPROT, INR in the last 72 hours. ABG    Component Value Date/Time   PHART 7.384 10/05/2023 0759   HCO3 23.8 10/05/2023 0802   HCO3 23.9 10/05/2023 0802   TCO2 22 11/24/2023 1206   ACIDBASEDEF 2.0 10/05/2023 0802   ACIDBASEDEF 2.0 10/05/2023 0802   O2SAT 77 10/05/2023 0802   O2SAT 78 10/05/2023 0802   CBG (last 3)  Recent Labs    11/24/23 1610 11/24/23 2103 11/25/23 0607  GLUCAP 82 105* 115*    Assessment/Plan: S/P Procedure(s) (LRB): Transcatheter Aortic Valve Replacement, Transfemoral (N/A) ECHOCARDIOGRAM,  TRANSTHORACIC (N/A) Doing well, no rhythm or access site issues Plan for home today following TTE.   LOS: 1 day    Con RAMAN Eyva Califano 11/25/2023

## 2023-11-25 NOTE — Progress Notes (Signed)
  Echocardiogram 2D Echocardiogram has been performed.  Patrick Gordon 11/25/2023, 10:11 AM

## 2023-11-26 ENCOUNTER — Telehealth (HOSPITAL_COMMUNITY): Payer: Self-pay

## 2023-11-26 ENCOUNTER — Telehealth: Payer: Self-pay

## 2023-11-26 LAB — TYPE AND SCREEN
ABO/RH(D): B POS
Antibody Screen: NEGATIVE
Unit division: 0
Unit division: 0

## 2023-11-26 LAB — BPAM RBC
Blood Product Expiration Date: 202510052359
Blood Product Expiration Date: 202510052359
Unit Type and Rh: 7300
Unit Type and Rh: 7300

## 2023-11-26 NOTE — Telephone Encounter (Signed)
 I attempted to contact patient for [facility] documentation and left message asking patient to return call.

## 2023-11-26 NOTE — Telephone Encounter (Signed)
 I left another phone message responding to a MyChart message from the patient. The patient asked to postpone his [facility] appointment scheduled for 11/29/53 at 12:00pm. I asked patient to return my call to get him rescheduled, since we cannot accommodate his appointment request, as well as conduct his TOC call.

## 2023-11-26 NOTE — Telephone Encounter (Signed)
 Per phase I cardiac rehab, pt is not interested in the cardiac rehab program at this time.   Closed referral.

## 2023-11-26 NOTE — Progress Notes (Signed)
 noted

## 2023-11-27 NOTE — Telephone Encounter (Signed)
 Patient contacted regarding discharge from [facility] on 11/25/23  Patient understands to follow up with provider Izetta Hummer, PA-C on 12/03/23 at 3:35PM at [address] Location. Patient understands discharge instructions? Yes Patient understands medications and regiment? Yes Patient understands to bring all medications to this visit? Yes

## 2023-11-30 ENCOUNTER — Ambulatory Visit: Admitting: Physician Assistant

## 2023-11-30 NOTE — Anesthesia Postprocedure Evaluation (Signed)
 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) Performed: Transcatheter Aortic Valve Replacement, Transfemoral ECHOCARDIOGRAM, TRANSTHORACIC     Patient location during evaluation: Cath Lab Anesthesia Type: MAC Level of consciousness: awake and patient cooperative Pain management: pain level controlled Vital Signs Assessment: post-procedure vital signs reviewed and stable Respiratory status: spontaneous breathing, nonlabored ventilation and respiratory function stable Cardiovascular status: stable and blood pressure returned to baseline Postop Assessment: no apparent nausea or vomiting Anesthetic complications: no   There were no known notable events for this encounter.                  Rice Walsh

## 2023-12-01 ENCOUNTER — Other Ambulatory Visit (HOSPITAL_COMMUNITY): Payer: Self-pay

## 2023-12-01 ENCOUNTER — Other Ambulatory Visit: Payer: Self-pay

## 2023-12-01 MED ORDER — FLUZONE HIGH-DOSE 0.5 ML IM SUSY
0.5000 mL | PREFILLED_SYRINGE | Freq: Once | INTRAMUSCULAR | 0 refills | Status: AC
  Filled 2023-12-01: qty 0.5, 1d supply, fill #0

## 2023-12-01 MED ORDER — COMIRNATY 30 MCG/0.3ML IM SUSY
0.3000 mL | PREFILLED_SYRINGE | Freq: Once | INTRAMUSCULAR | 0 refills | Status: AC
Start: 2023-12-01 — End: 2023-12-02
  Filled 2023-12-01: qty 0.3, 1d supply, fill #0

## 2023-12-03 ENCOUNTER — Ambulatory Visit: Attending: Physician Assistant | Admitting: Physician Assistant

## 2023-12-03 ENCOUNTER — Encounter: Payer: Self-pay | Admitting: Cardiology

## 2023-12-03 VITALS — HR 74 | Ht 68.0 in | Wt 181.1 lb

## 2023-12-03 DIAGNOSIS — Z952 Presence of prosthetic heart valve: Secondary | ICD-10-CM | POA: Diagnosis not present

## 2023-12-03 DIAGNOSIS — I2510 Atherosclerotic heart disease of native coronary artery without angina pectoris: Secondary | ICD-10-CM | POA: Diagnosis not present

## 2023-12-03 DIAGNOSIS — I10 Essential (primary) hypertension: Secondary | ICD-10-CM | POA: Diagnosis present

## 2023-12-03 DIAGNOSIS — E785 Hyperlipidemia, unspecified: Secondary | ICD-10-CM

## 2023-12-03 DIAGNOSIS — I5032 Chronic diastolic (congestive) heart failure: Secondary | ICD-10-CM | POA: Diagnosis not present

## 2023-12-03 MED ORDER — AMOXICILLIN 500 MG PO TABS: 2000.0000 mg | ORAL_TABLET | ORAL | 12 refills | Status: AC

## 2023-12-03 NOTE — Progress Notes (Signed)
 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    He developed increased exertional shortness of breath and substernal chest tightness while traveling in [place]. Echo in 09/2023 showed EF 60% and severe AS with mean grad 38 mmHg & AVA 0.89 cm2. [facility] 10/05/23 showed nonobstructive coronary disease with patent LAD stents. The mean aortic valve gradient was 31 mmHg. S/p TAVR with a 26 mm +2CC Edwards Sapien 3 Ultra Resilia THV via the TF approach on 11/24/23. Post operative echo showed EF 55%, moderate MAC, mild LVH of the basal-septal segment and normally functioning TAVR with a mean gradient of 6 mmHg and no PVL. He had an uncomplicated hospital course and discharged on POD1.  Today the patient presents to clinic for follow up. No CP or SOB. No LE edema, orthopnea or PND. No dizziness or syncope. No blood in stool or urine. One episode of subjective palpitations that correlated with a normal pulse and may have been anxiety. He was able to walk up an incline that he couldn't before with no issues.    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 Fell and Dr. Aneita   Sciatica    Severe aortic stenosis    Thyroid  nodule    normal thyroid  function per patient     Current Medications: Current Meds  Medication Sig   amoxicillin  (AMOXIL ) 500 MG tablet Take 4 tablets (2,000 mg total) by mouth as directed. 1 hour prior to dental work including cleanings   aspirin  EC 81 MG tablet Take 81 mg by mouth at bedtime.   atorvastatin  (LIPITOR ) 10 MG tablet Take 1 tablet (10 mg total) by mouth daily.   carvedilol  (COREG ) 12.5 MG tablet Take 1 tablet (12.5 mg total) by mouth 2 (two) times daily.   enalapril  (VASOTEC ) 2.5 MG tablet Take 1 tablet (2.5 mg total) by mouth 2 (two) times daily.   fluticasone (FLONASE) 50 MCG/ACT nasal spray Place 1-2 sprays into both nostrils daily as needed for allergies.   omeprazole (PRILOSEC) 40 MG capsule Take 40 mg by mouth at bedtime.   ticagrelor  (BRILINTA ) 60  MG TABS tablet Take 1 tablet (60 mg total) by mouth 2 (two) times daily.      ROS:   Please see the history of present illness.    All other systems reviewed and are negative.  EKGs   EKG Interpretation Date/Time:  Thursday December 03 2023 15:44:11 EDT Ventricular Rate:  74 PR Interval:  162 QRS Duration:  74 QT Interval:  360 QTC Calculation: 399 R Axis:   10  Text Interpretation: Normal sinus rhythm Low voltage QRS Cannot rule out Anterior infarct (cited on or before 25-Sep-2023) Confirmed by Sebastian Collar [phone]) on 12/03/2023 3:49:57 PM   Risk Assessment/Calculations:           Physical Exam:    VS:  Pulse 74   Ht 5' 8 (1.727 m)   Wt 181 lb 1.6 oz (82.1 kg)   BMI 27.54 kg/m     Wt Readings from Last 3 Encounters:  12/03/23 181 lb 1.6 oz (82.1 kg)  11/25/23 181 lb (82.1 kg)  11/09/23 183 lb 6.4 oz  (83.2 kg)     GEN: Well nourished, well developed in no acute distress NECK: No JVD CARDIAC: RRR, no murmurs, rubs, gallops RESPIRATORY:  Clear to auscultation without rales, wheezing or rhonchi  ABDOMEN: Soft, non-tender, non-distended EXTREMITIES:  No edema; No deformity.  Groin sites clear without hematoma or ecchymosis.   ASSESSMENT:    1. S/P TAVR (transcatheter aortic valve replacement)   2. Heart failure with improved ejection fraction (HFimpEF) (HCC)   3. Coronary artery disease involving native coronary artery of native heart without angina pectoris   4. Dyslipidemia   5. Primary hypertension     PLAN:    In order of problems listed above:  Severe AS s/p TAVR:  -- Pt doing great s/p TAVR.  -- ECG with no HAVB.  -- Groin sites healing well.  -- SBE prophylaxis discussed; I have RX'd amoxicillin .   -- Continue chronic Brilinta  60mg  BID and aspirin  81mg  daily.  -- Cleared to resume all activities without restriction. -- I will see back for 1 month echo and OV.  Ischemic cardiomyopathy/HFimpEF: -- Initial EF 25-35% with MI. EF subsequently recovered.   -- Continue enalapril  2.5 mg BID and Coreg  12.5mg  BID.    CAD:  -- Anterior STEMI associated with Vfib arrest in 2014 and emergent PCI of the LAD.  -- Has been maintained on DAPT with Brilinta  60mg  BID and aspirin  81mg  daily given long area of stenting.  -- Continue atorvastatin  10mg  daily.    HLD.  -- Continue atorvastatin  10mg  daily. -- LDL at goal (52) on 09/28/23.  HTN: -- BP well controlled.  -- Continue enalapril  2.5 mg BID and Coreg  12.5mg  BID   Medication Adjustments/Labs and Tests Ordered: Current medicines are reviewed at length with the patient today.  Concerns regarding medicines are outlined above.  Orders Placed This Encounter  Procedures   EKG 12-Lead   Meds ordered this encounter  Medications   amoxicillin  (AMOXIL ) 500 MG tablet    Sig: Take 4 tablets (2,000 mg total) by mouth as directed.  1 hour prior to dental work including cleanings    Dispense:  12 tablet    Refill:  12    Supervising Provider:   WONDA SHARPER [[id number]]    Patient Instructions  Medication Instructions:  Your physician has recommended you make the following change in your medication: Start Amoxicillin  500 mg, take 4 tablets by mouth 1 hour prior to dental procedures and cleanings.    *  If you need a refill on your cardiac medications before your next appointment, please call your pharmacy*  Lab Work: None needed If you have labs (blood work) drawn today and your tests are completely normal, you will receive your results only by: MyChart Message (if you have MyChart) OR A paper copy in the mail If you have any lab test that is abnormal or we need to change your treatment, we will call you to review the results.  Testing/Procedures: None needed  Follow-Up: At [facility], you and your health needs are our priority.  As part of our continuing mission to provide you with exceptional heart care, our providers are all part of one team.  This team includes your primary Cardiologist (physician) and Advanced Practice Providers or APPs (Physician Assistants and Nurse Practitioners) who all work together to provide you with the care you need, when you need it.  Your next appointment:   As scheduled  Provider:   Izetta Hummer, PA-C    We recommend signing up for the patient portal called MyChart.  Sign up information is provided on this After Visit Summary.  MyChart is used to connect with patients for Virtual Visits (Telemedicine).  Patients are able to view lab/test results, encounter notes, upcoming appointments, etc.  Non-urgent messages can be sent to your provider as well.   To learn more about what you can do with MyChart, go to [email or web address].           Signed, Lamarr Hummer, PA-C  12/03/2023 4:03 PM    [facility]

## 2023-12-03 NOTE — Patient Instructions (Signed)
 Medication Instructions:  Your physician has recommended you make the following change in your medication: Start Amoxicillin  500 mg, take 4 tablets by mouth 1 hour prior to dental procedures and cleanings.    *If you need a refill on your cardiac medications before your next appointment, please call your pharmacy*  Lab Work: None needed If you have labs (blood work) drawn today and your tests are completely normal, you will receive your results only by: MyChart Message (if you have MyChart) OR A paper copy in the mail If you have any lab test that is abnormal or we need to change your treatment, we will call you to review the results.  Testing/Procedures: None needed  Follow-Up: At [facility], you and your health needs are our priority.  As part of our continuing mission to provide you with exceptional heart care, our providers are all part of one team.  This team includes your primary Cardiologist (physician) and Advanced Practice Providers or APPs (Physician Assistants and Nurse Practitioners) who all work together to provide you with the care you need, when you need it.  Your next appointment:   As scheduled  Provider:   Izetta Hummer, PA-C    We recommend signing up for the patient portal called MyChart.  Sign up information is provided on this After Visit Summary.  MyChart is used to connect with patients for Virtual Visits (Telemedicine).  Patients are able to view lab/test results, encounter notes, upcoming appointments, etc.  Non-urgent messages can be sent to your provider as well.   To learn more about what you can do with MyChart, go to [email or web address].

## 2023-12-30 NOTE — Progress Notes (Signed)
 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    He developed increased exertional shortness of breath and substernal chest tightness while traveling in [place]. Echo in 09/2023 showed EF 60% and severe AS with mean grad 38 mmHg & AVA 0.89 cm2. [facility] 10/05/23 showed nonobstructive coronary disease with patent LAD stents. The mean aortic valve gradient was 31 mmHg. S/p TAVR with a 26 mm +2CC Edwards Sapien 3 Ultra Resilia THV via the TF approach on 11/24/23. Post operative echo showed EF 55%, moderate MAC, mild LVH of the basal-septal segment and normally functioning TAVR with a mean gradient of 6 mmHg and no PVL. He had an uncomplicated hospital course and discharged on POD1.  Today the patient presents to clinic for follow up. He was able to walk up an incline that he couldn't before with no issues. Walked 10 miles the other day and then went on vacation and now working back up. No CP or SOB. No LE edema, orthopnea or PND. No dizziness or syncope. No blood in stool or urine. No palpitations.    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 Fell and Dr. Aneita   Sciatica    Severe aortic stenosis    Thyroid  nodule    normal thyroid  function per patient     Current Medications: Current Meds  Medication Sig   amoxicillin  (AMOXIL ) 500 MG tablet Take 4 tablets (2,000 mg total) by mouth as directed. 1 hour prior to dental work including cleanings   aspirin  EC 81 MG tablet Take 81 mg by mouth at bedtime.   atorvastatin  (LIPITOR ) 10 MG tablet Take 1 tablet (10 mg total) by mouth daily.   carvedilol  (COREG ) 12.5 MG tablet Take 1 tablet (12.5 mg total) by mouth 2 (two) times daily.   enalapril  (VASOTEC ) 2.5 MG tablet Take 1 tablet (2.5 mg total) by mouth 2 (two) times daily.   fluticasone (FLONASE) 50 MCG/ACT nasal spray Place 1-2 sprays into both nostrils daily as needed for allergies.   omeprazole (PRILOSEC) 40 MG capsule Take 40 mg by mouth at bedtime.  ticagrelor  (BRILINTA ) 60 MG TABS tablet Take 1 tablet (60 mg total) by mouth 2 (two) times daily.      ROS:   Please see the history of present illness.    All other systems reviewed and are negative.  EKGs       Risk Assessment/Calculations:           Physical Exam:    VS:  BP 134/62   Pulse 71   Ht 5' 8 (1.727 m)   Wt 184 lb (83.5 kg)   SpO2 99%   BMI 27.98 kg/m     Wt Readings from Last 3 Encounters:  12/31/23 184 lb (83.5 kg)  12/03/23 181 lb 1.6 oz (82.1 kg)  11/25/23 181 lb (82.1 kg)     GEN: Well nourished, well developed in no acute distress NECK: No JVD CARDIAC: RRR, no murmurs, rubs, gallops RESPIRATORY:  Clear to auscultation without rales, wheezing or rhonchi  ABDOMEN: Soft, non-tender, non-distended EXTREMITIES:  No edema; No deformity.    ASSESSMENT:    1. S/P TAVR (transcatheter aortic valve replacement)   2.  Heart failure with improved ejection fraction (HFimpEF) (HCC)   3. Coronary artery disease involving native coronary artery of native heart without angina pectoris   4. Dyslipidemia   5. Primary hypertension     PLAN:    In order of problems listed above:  Severe AS s/p TAVR:  -- Echo today shows EF 50-55%, normally functioning TAVR with a mean gradient of 5 mm hg and trvial PVL.  -- NYHA class I symptoms.   -- SBE prophylaxis discussed; he has amoxicillin .   -- Continue chronic Brilinta  60mg  BID and aspirin  81mg  daily.  -- I will see back for 1 year echo and OV.  Ischemic cardiomyopathy/HFimpEF: -- Initial EF 25-35% with MI. EF subsequently recovered.   -- EF 50-55% today.  -- Continue enalapril  2.5 mg BID and Coreg  12.5mg  BID.    CAD:  -- Anterior STEMI associated with Vfib arrest in 2014 and emergent PCI of the LAD.  -- Has been maintained on DAPT with Brilinta  60mg  BID and aspirin  81mg  daily given long area of stenting.  -- Continue atorvastatin  10mg  daily.    HLD.  -- Continue atorvastatin  10mg  daily. -- LDL at goal (52) on 09/28/23.  HTN: -- BP well controlled.  -- Continue enalapril  2.5 mg BID and Coreg  12.5mg  BID   Medication Adjustments/Labs and Tests Ordered: Current medicines are reviewed at length with the patient today.  Concerns regarding medicines are outlined above.  No orders of the defined types were placed in this encounter.  No orders of the defined types were placed in this encounter.   Patient Instructions  Medication Instructions:  Your physician recommends that you continue on your current medications as directed. Please refer to the Current Medication list given to you today.  *If you need a refill on your cardiac medications before your next appointment, please call your pharmacy*  Lab Work: None needed If you have labs (blood work) drawn today and your tests are completely normal, you will receive your results only by: MyChart Message (if  you have MyChart) OR A paper copy in the mail If you have any lab test that is abnormal or we need to change your treatment, we will call you to review the results.  Testing/Procedures: 11/28/24 Your physician has requested that you have an echocardiogram. Echocardiography is a painless test that uses sound waves to create images of your heart. It  provides your doctor with information about the size and shape of your heart and how well your heart's chambers and valves are working. This procedure takes approximately one hour. There are no restrictions for this procedure. Please do NOT wear cologne, perfume, aftershave, or lotions (deodorant is allowed). Please arrive 15 minutes prior to your appointment time.  Please note: We ask at that you not bring children with you during ultrasound (echo/ vascular) testing. Due to room size and safety concerns, children are not allowed in the ultrasound rooms during exams. Our front office staff cannot provide observation of children in our lobby area while testing is being conducted. An adult accompanying a patient to their appointment will only be allowed in the ultrasound room at the discretion of the ultrasound technician under special circumstances. We apologize for any inconvenience.   Follow-Up: At [facility], you and your health needs are our priority.  As part of our continuing mission to provide you with exceptional heart care, our providers are all part of one team.  This team includes your primary Cardiologist (physician) and Advanced Practice Providers or APPs (Physician Assistants and Nurse Practitioners) who all work together to provide you with the care you need, when you need it.  Your next appointment:   As scheduled on 11/28/24  Provider:   Izetta Hummer, PA-C  We recommend signing up for the patient portal called MyChart.  Sign up information is provided on this After Visit Summary.  MyChart is used to connect with patients for  Virtual Visits (Telemedicine).  Patients are able to view lab/test results, encounter notes, upcoming appointments, etc.  Non-urgent messages can be sent to your provider as well.   To learn more about what you can do with MyChart, go to [email or web address].          Signed, Lamarr Hummer, PA-C  01/01/2024 3:07 AM    [facility]

## 2023-12-31 ENCOUNTER — Ambulatory Visit (INDEPENDENT_AMBULATORY_CARE_PROVIDER_SITE_OTHER): Admitting: Physician Assistant

## 2023-12-31 ENCOUNTER — Ambulatory Visit
Admission: RE | Admit: 2023-12-31 | Discharge: 2023-12-31 | Disposition: A | Discharge status: Home / Self Care | Source: Ambulatory Visit | Attending: Cardiology | Admitting: Cardiology

## 2023-12-31 VITALS — BP 134/62 | HR 71 | Ht 68.0 in | Wt 184.0 lb

## 2023-12-31 DIAGNOSIS — Z952 Presence of prosthetic heart valve: Secondary | ICD-10-CM

## 2023-12-31 DIAGNOSIS — E785 Hyperlipidemia, unspecified: Secondary | ICD-10-CM | POA: Diagnosis present

## 2023-12-31 DIAGNOSIS — I5020 Unspecified systolic (congestive) heart failure: Secondary | ICD-10-CM

## 2023-12-31 DIAGNOSIS — I2510 Atherosclerotic heart disease of native coronary artery without angina pectoris: Secondary | ICD-10-CM | POA: Insufficient documentation

## 2023-12-31 DIAGNOSIS — I10 Essential (primary) hypertension: Secondary | ICD-10-CM | POA: Insufficient documentation

## 2023-12-31 LAB — ECHOCARDIOGRAM COMPLETE
AR max vel: 1.98 cm2
AV Area VTI: 2.17 cm2
AV Area mean vel: 1.95 cm2
AV Mean grad: 5 mmHg
AV Peak grad: 9.9 mmHg
Ao pk vel: 1.57 m/s
Area-P 1/2: 3.09 cm2
P 1/2 time: 233 ms
S' Lateral: 2.6 cm

## 2023-12-31 NOTE — Patient Instructions (Signed)
 Medication Instructions:  Your physician recommends that you continue on your current medications as directed. Please refer to the Current Medication list given to you today.  *If you need a refill on your cardiac medications before your next appointment, please call your pharmacy*  Lab Work: None needed If you have labs (blood work) drawn today and your tests are completely normal, you will receive your results only by: MyChart Message (if you have MyChart) OR A paper copy in the mail If you have any lab test that is abnormal or we need to change your treatment, we will call you to review the results.  Testing/Procedures: 11/28/24 Your physician has requested that you have an echocardiogram. Echocardiography is a painless test that uses sound waves to create images of your heart. It provides your doctor with information about the size and shape of your heart and how well your heart's chambers and valves are working. This procedure takes approximately one hour. There are no restrictions for this procedure. Please do NOT wear cologne, perfume, aftershave, or lotions (deodorant is allowed). Please arrive 15 minutes prior to your appointment time.  Please note: We ask at that you not bring children with you during ultrasound (echo/ vascular) testing. Due to room size and safety concerns, children are not allowed in the ultrasound rooms during exams. Our front office staff cannot provide observation of children in our lobby area while testing is being conducted. An adult accompanying a patient to their appointment will only be allowed in the ultrasound room at the discretion of the ultrasound technician under special circumstances. We apologize for any inconvenience.   Follow-Up: At [facility], you and your health needs are our priority.  As part of our continuing mission to provide you with exceptional heart care, our providers are all part of one team.  This team includes your primary  Cardiologist (physician) and Advanced Practice Providers or APPs (Physician Assistants and Nurse Practitioners) who all work together to provide you with the care you need, when you need it.  Your next appointment:   As scheduled on 11/28/24  Provider:   Izetta Hummer, PA-C  We recommend signing up for the patient portal called MyChart.  Sign up information is provided on this After Visit Summary.  MyChart is used to connect with patients for Virtual Visits (Telemedicine).  Patients are able to view lab/test results, encounter notes, upcoming appointments, etc.  Non-urgent messages can be sent to your provider as well.   To learn more about what you can do with MyChart, go to [email or web address].

## 2024-01-01 ENCOUNTER — Ambulatory Visit: Payer: Self-pay | Admitting: Physician Assistant

## 2024-05-17 ENCOUNTER — Ambulatory Visit: Admitting: Cardiology

## 2024-08-09 ENCOUNTER — Ambulatory Visit

## 2024-08-15 ENCOUNTER — Ambulatory Visit

## 2024-11-28 ENCOUNTER — Ambulatory Visit (HOSPITAL_COMMUNITY)

## 2024-11-28 ENCOUNTER — Ambulatory Visit: Admitting: Physician Assistant
